# Patient Record
Sex: Male | Born: 1955 | Race: Black or African American | Hispanic: No | Marital: Single
Health system: Southern US, Academic
[De-identification: ages and names within clinical notes are randomized; demographics above are authoritative.]

## PROBLEM LIST (undated history)

## (undated) ENCOUNTER — Ambulatory Visit: Payer: Medicare (Managed Care)

## (undated) ENCOUNTER — Telehealth

## (undated) ENCOUNTER — Encounter

## (undated) ENCOUNTER — Ambulatory Visit

## (undated) ENCOUNTER — Encounter: Attending: Nephrology | Primary: Nephrology

## (undated) ENCOUNTER — Ambulatory Visit
Payer: Medicare (Managed Care) | Attending: Student in an Organized Health Care Education/Training Program | Primary: Student in an Organized Health Care Education/Training Program

## (undated) ENCOUNTER — Telehealth: Attending: Internal Medicine | Primary: Internal Medicine

## (undated) ENCOUNTER — Ambulatory Visit: Payer: MEDICARE

## (undated) ENCOUNTER — Encounter: Attending: Adult Health | Primary: Adult Health

## (undated) ENCOUNTER — Ambulatory Visit
Payer: Medicare (Managed Care) | Attending: Rehabilitative and Restorative Service Providers" | Primary: Rehabilitative and Restorative Service Providers"

## (undated) ENCOUNTER — Ambulatory Visit: Payer: MEDICARE | Attending: Nephrology | Primary: Nephrology

## (undated) ENCOUNTER — Ambulatory Visit
Attending: Rehabilitative and Restorative Service Providers" | Primary: Rehabilitative and Restorative Service Providers"

## (undated) ENCOUNTER — Encounter
Payer: MEDICARE | Attending: Student in an Organized Health Care Education/Training Program | Primary: Student in an Organized Health Care Education/Training Program

## (undated) ENCOUNTER — Encounter: Attending: Internal Medicine | Primary: Internal Medicine

## (undated) ENCOUNTER — Encounter: Payer: MEDICARE | Attending: Nephrology | Primary: Nephrology

## (undated) ENCOUNTER — Ambulatory Visit: Attending: Geriatric Medicine | Primary: Geriatric Medicine

## (undated) ENCOUNTER — Encounter: Attending: Clinical | Primary: Clinical

## (undated) ENCOUNTER — Encounter: Payer: MEDICARE | Attending: Registered" | Primary: Registered"

## (undated) ENCOUNTER — Encounter: Payer: MEDICARE | Attending: Internal Medicine | Primary: Internal Medicine

## (undated) ENCOUNTER — Inpatient Hospital Stay: Payer: Medicare (Managed Care)

## (undated) ENCOUNTER — Ambulatory Visit
Attending: Student in an Organized Health Care Education/Training Program | Primary: Student in an Organized Health Care Education/Training Program

## (undated) ENCOUNTER — Inpatient Hospital Stay: Payer: MEDICARE

## (undated) ENCOUNTER — Ambulatory Visit: Attending: Nephrology | Primary: Nephrology

## (undated) ENCOUNTER — Inpatient Hospital Stay

## (undated) DIAGNOSIS — E119 Type 2 diabetes mellitus without complications: Secondary | ICD-10-CM

## (undated) DIAGNOSIS — N289 Disorder of kidney and ureter, unspecified: Secondary | ICD-10-CM

## (undated) DIAGNOSIS — I10 Essential (primary) hypertension: Secondary | ICD-10-CM

## (undated) HISTORY — PX: CHOLECYSTECTOMY: SHX55

## (undated) HISTORY — PX: NEPHRECTOMY TRANSPLANTED ORGAN: SUR880

---
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
## 2004-04-26 ENCOUNTER — Emergency Department: Payer: Self-pay | Admitting: Internal Medicine

## 2006-12-01 ENCOUNTER — Emergency Department: Payer: Self-pay | Admitting: Emergency Medicine

## 2006-12-01 ENCOUNTER — Other Ambulatory Visit: Payer: Self-pay

## 2009-07-05 IMAGING — CR DG CHEST 1V PORT
1 series · 1 of 1 positions shown · non-contrast
Comparison: none

REASON FOR EXAM: hyperkalemia
COMMENTS:

[view not recorded]
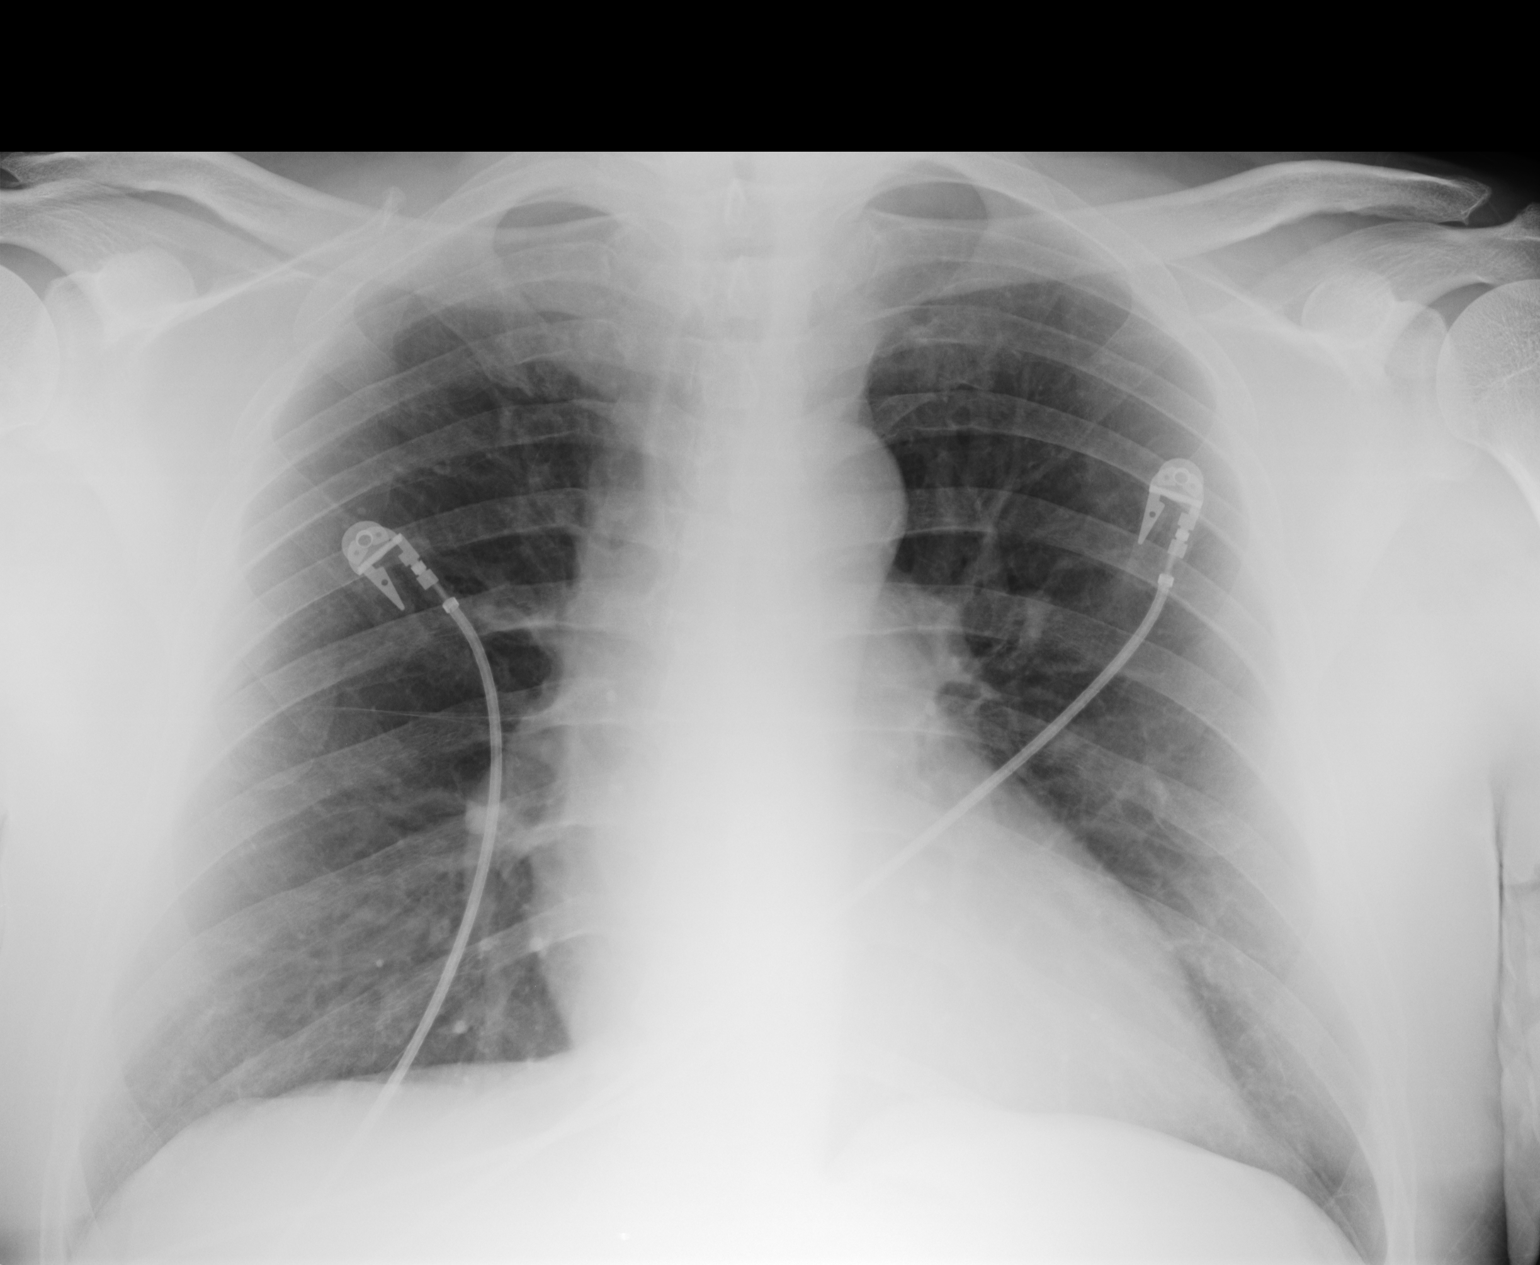

[1 of 1 positions shown; findings below may reference images not displayed]

PROCEDURE:     DXR - DXR PORTABLE CHEST SINGLE VIEW  - December 01, 2006  [DATE]

RESULT:     There are no prior studies for comparison. Cardiac monitoring
electrodes are present. The lungs are clear. The heart and pulmonary vessels
are normal. The bony and mediastinal structures are unremarkable. There is
no effusion. There is no pneumothorax or evidence of congestive failure.
IMPRESSION: No acute cardiopulmonary disease.

## 2010-11-25 ENCOUNTER — Other Ambulatory Visit: Payer: Self-pay

## 2010-12-02 ENCOUNTER — Other Ambulatory Visit: Payer: Self-pay

## 2011-01-15 ENCOUNTER — Other Ambulatory Visit: Payer: Self-pay

## 2011-02-01 ENCOUNTER — Other Ambulatory Visit: Payer: Self-pay

## 2011-04-23 ENCOUNTER — Other Ambulatory Visit: Payer: Self-pay

## 2011-05-04 ENCOUNTER — Other Ambulatory Visit: Payer: Self-pay

## 2011-07-14 ENCOUNTER — Other Ambulatory Visit: Payer: Self-pay

## 2011-07-14 LAB — BASIC METABOLIC PANEL
Anion Gap: 12 (ref 7–16)
BUN: 45 mg/dL — ABNORMAL HIGH (ref 7–18)
Calcium, Total: 9.5 mg/dL (ref 8.5–10.1)
Chloride: 107 mmol/L (ref 98–107)
Co2: 21 mmol/L (ref 21–32)
EGFR (African American): 44 — ABNORMAL LOW
EGFR (Non-African Amer.): 36 — ABNORMAL LOW
Glucose: 111 mg/dL — ABNORMAL HIGH (ref 65–99)
Osmolality: 292 (ref 275–301)
Sodium: 140 mmol/L (ref 136–145)

## 2011-07-14 LAB — CBC WITH DIFFERENTIAL/PLATELET
Basophil %: 0.7 %
Eosinophil #: 0.1 10*3/uL (ref 0.0–0.7)
Eosinophil %: 1.8 %
Lymphocyte #: 0.9 10*3/uL — ABNORMAL LOW (ref 1.0–3.6)
MCH: 26.6 pg (ref 26.0–34.0)
MCHC: 32.6 g/dL (ref 32.0–36.0)
Monocyte #: 0.5 10*3/uL (ref 0.0–0.7)
Neutrophil #: 2.2 10*3/uL (ref 1.4–6.5)
Neutrophil %: 60.5 %
Platelet: 156 10*3/uL (ref 150–440)
RBC: 5.26 10*6/uL (ref 4.40–5.90)

## 2011-08-02 ENCOUNTER — Other Ambulatory Visit: Payer: Self-pay

## 2011-08-16 LAB — CBC WITH DIFFERENTIAL/PLATELET
Basophil %: 0.1 %
HCT: 43.3 % (ref 40.0–52.0)
HGB: 13.7 g/dL (ref 13.0–18.0)
Lymphocyte #: 0.5 10*3/uL — ABNORMAL LOW (ref 1.0–3.6)
Lymphocyte %: 4.5 %
MCHC: 31.7 g/dL — ABNORMAL LOW (ref 32.0–36.0)
MCV: 82 fL (ref 80–100)
Platelet: 305 10*3/uL (ref 150–440)
RBC: 5.27 10*6/uL (ref 4.40–5.90)
RDW: 16.4 % — ABNORMAL HIGH (ref 11.5–14.5)

## 2011-08-16 LAB — BASIC METABOLIC PANEL
Anion Gap: 16 (ref 7–16)
BUN: 114 mg/dL — ABNORMAL HIGH (ref 7–18)
EGFR (Non-African Amer.): 16 — ABNORMAL LOW
Osmolality: 302 (ref 275–301)
Potassium: 4.5 mmol/L (ref 3.5–5.1)
Sodium: 130 mmol/L — ABNORMAL LOW (ref 136–145)

## 2011-08-16 LAB — PHOSPHORUS: Phosphorus: 4.4 mg/dL (ref 2.5–4.9)

## 2011-08-16 LAB — MAGNESIUM: Magnesium: 1.8 mg/dL

## 2011-09-01 ENCOUNTER — Other Ambulatory Visit: Payer: Self-pay

## 2011-11-25 ENCOUNTER — Other Ambulatory Visit: Payer: Self-pay

## 2011-11-25 LAB — CBC WITH DIFFERENTIAL/PLATELET
Basophil #: 0 10*3/uL (ref 0.0–0.1)
HCT: 41.1 % (ref 40.0–52.0)
Lymphocyte #: 1.1 10*3/uL (ref 1.0–3.6)
Lymphocyte %: 30.1 %
MCH: 25.5 pg — ABNORMAL LOW (ref 26.0–34.0)
MCHC: 32.2 g/dL (ref 32.0–36.0)
MCV: 79 fL — ABNORMAL LOW (ref 80–100)
Monocyte #: 0.4 x10 3/mm (ref 0.2–1.0)
Neutrophil #: 2 10*3/uL (ref 1.4–6.5)
Platelet: 211 10*3/uL (ref 150–440)
RBC: 5.18 10*6/uL (ref 4.40–5.90)
RDW: 15.5 % — ABNORMAL HIGH (ref 11.5–14.5)
WBC: 3.7 10*3/uL — ABNORMAL LOW (ref 3.8–10.6)

## 2011-11-25 LAB — MAGNESIUM: Magnesium: 2.1 mg/dL

## 2011-11-25 LAB — COMPREHENSIVE METABOLIC PANEL
Albumin: 3.8 g/dL (ref 3.4–5.0)
Alkaline Phosphatase: 71 U/L (ref 50–136)
BUN: 58 mg/dL — ABNORMAL HIGH (ref 7–18)
Calcium, Total: 9.5 mg/dL (ref 8.5–10.1)
Co2: 23 mmol/L (ref 21–32)
Creatinine: 2.47 mg/dL — ABNORMAL HIGH (ref 0.60–1.30)
EGFR (African American): 32 — ABNORMAL LOW
EGFR (Non-African Amer.): 28 — ABNORMAL LOW
Potassium: 4.7 mmol/L (ref 3.5–5.1)
SGOT(AST): 16 U/L (ref 15–37)
SGPT (ALT): 13 U/L

## 2011-11-25 LAB — PHOSPHORUS: Phosphorus: 3.6 mg/dL (ref 2.5–4.9)

## 2011-11-25 LAB — BILIRUBIN, DIRECT: Bilirubin, Direct: <0.1 mg/dL (ref 0.00–0.20)

## 2011-11-25 LAB — LIPID PANEL
Cholesterol: 146 mg/dL (ref 0–200)
Triglycerides: 127 mg/dL (ref 0–200)

## 2011-11-25 LAB — GAMMA GT: GGT: 14 U/L (ref 5–85)

## 2011-12-02 ENCOUNTER — Other Ambulatory Visit: Payer: Self-pay

## 2012-01-25 ENCOUNTER — Other Ambulatory Visit: Payer: Self-pay

## 2012-01-25 LAB — CBC WITH DIFFERENTIAL/PLATELET
Basophil #: 0 10*3/uL (ref 0.0–0.1)
Basophil %: 1.1 %
Eosinophil #: 0.1 10*3/uL (ref 0.0–0.7)
HGB: 14.2 g/dL (ref 13.0–18.0)
Lymphocyte #: 1 10*3/uL (ref 1.0–3.6)
Lymphocyte %: 28.4 %
MCH: 25.6 pg — ABNORMAL LOW (ref 26.0–34.0)
MCHC: 33.1 g/dL (ref 32.0–36.0)
MCV: 77 fL — ABNORMAL LOW (ref 80–100)
Neutrophil %: 57.7 %

## 2012-01-25 LAB — BASIC METABOLIC PANEL
Anion Gap: 9 (ref 7–16)
BUN: 44 mg/dL — ABNORMAL HIGH (ref 7–18)
Calcium, Total: 9.3 mg/dL (ref 8.5–10.1)
EGFR (African American): 41 — ABNORMAL LOW
EGFR (Non-African Amer.): 35 — ABNORMAL LOW
Glucose: 119 mg/dL — ABNORMAL HIGH (ref 65–99)
Osmolality: 282 (ref 275–301)
Sodium: 135 mmol/L — ABNORMAL LOW (ref 136–145)

## 2012-01-25 LAB — MAGNESIUM: Magnesium: 1.5 mg/dL — ABNORMAL LOW

## 2012-02-01 ENCOUNTER — Other Ambulatory Visit: Payer: Self-pay

## 2012-03-15 ENCOUNTER — Other Ambulatory Visit: Payer: Self-pay

## 2012-03-15 LAB — CBC WITH DIFFERENTIAL/PLATELET
Basophil #: 0 10*3/uL (ref 0.0–0.1)
Basophil %: 0.8 %
Lymphocyte #: 0.9 10*3/uL — ABNORMAL LOW (ref 1.0–3.6)
Lymphocyte %: 27.5 %
MCV: 79 fL — ABNORMAL LOW (ref 80–100)
Monocyte %: 10.4 %
Neutrophil #: 2 10*3/uL (ref 1.4–6.5)
Platelet: 178 10*3/uL (ref 150–440)
RDW: 17.7 % — ABNORMAL HIGH (ref 11.5–14.5)
WBC: 3.4 10*3/uL — ABNORMAL LOW (ref 3.8–10.6)

## 2012-03-15 LAB — COMPREHENSIVE METABOLIC PANEL
Bilirubin,Total: 0.4 mg/dL (ref 0.2–1.0)
Calcium, Total: 9.1 mg/dL (ref 8.5–10.1)
Chloride: 105 mmol/L (ref 98–107)
Co2: 22 mmol/L (ref 21–32)
Creatinine: 1.67 mg/dL — ABNORMAL HIGH (ref 0.60–1.30)
EGFR (African American): 51 — ABNORMAL LOW
EGFR (Non-African Amer.): 44 — ABNORMAL LOW
SGOT(AST): 13 U/L — ABNORMAL LOW (ref 15–37)
SGPT (ALT): 22 U/L (ref 12–78)
Total Protein: 8.3 g/dL — ABNORMAL HIGH (ref 6.4–8.2)

## 2012-03-15 LAB — LIPID PANEL
Cholesterol: 160 mg/dL (ref 0–200)
HDL Cholesterol: 56 mg/dL (ref 40–60)
Ldl Cholesterol, Calc: 89 mg/dL (ref 0–100)
Triglycerides: 75 mg/dL (ref 0–200)
VLDL Cholesterol, Calc: 15 mg/dL (ref 5–40)

## 2012-03-15 LAB — MAGNESIUM: Magnesium: 1.5 mg/dL — ABNORMAL LOW

## 2012-03-15 LAB — BILIRUBIN, DIRECT: Bilirubin, Direct: 0.1 mg/dL (ref 0.00–0.20)

## 2012-03-15 LAB — PHOSPHORUS: Phosphorus: 3.8 mg/dL (ref 2.5–4.9)

## 2012-04-02 ENCOUNTER — Other Ambulatory Visit: Payer: Self-pay

## 2012-06-09 ENCOUNTER — Other Ambulatory Visit: Payer: Self-pay | Admitting: Nephrology

## 2012-06-09 LAB — COMPREHENSIVE METABOLIC PANEL
Albumin: 3.7 g/dL (ref 3.4–5.0)
Alkaline Phosphatase: 85 U/L (ref 50–136)
BUN: 51 mg/dL — ABNORMAL HIGH (ref 7–18)
Bilirubin,Total: 0.3 mg/dL (ref 0.2–1.0)
Calcium, Total: 9.2 mg/dL (ref 8.5–10.1)
Co2: 20 mmol/L — ABNORMAL LOW (ref 21–32)
Creatinine: 2.05 mg/dL — ABNORMAL HIGH (ref 0.60–1.30)
EGFR (Non-African Amer.): 34 — ABNORMAL LOW
Glucose: 115 mg/dL — ABNORMAL HIGH (ref 65–99)
SGOT(AST): 18 U/L (ref 15–37)
SGPT (ALT): 28 U/L (ref 12–78)
Total Protein: 8.3 g/dL — ABNORMAL HIGH (ref 6.4–8.2)

## 2012-06-09 LAB — CBC WITH DIFFERENTIAL/PLATELET
Basophil #: 0 10*3/uL (ref 0.0–0.1)
Basophil %: 0.8 %
Eosinophil #: 0.1 10*3/uL (ref 0.0–0.7)
Lymphocyte #: 0.9 10*3/uL — ABNORMAL LOW (ref 1.0–3.6)
MCH: 25.9 pg — ABNORMAL LOW (ref 26.0–34.0)
MCV: 80 fL (ref 80–100)
Monocyte %: 12.1 %
Neutrophil #: 1.8 10*3/uL (ref 1.4–6.5)
Neutrophil %: 57 %
Platelet: 146 10*3/uL — ABNORMAL LOW (ref 150–440)
RBC: 5.42 10*6/uL (ref 4.40–5.90)
RDW: 15.8 % — ABNORMAL HIGH (ref 11.5–14.5)

## 2012-06-09 LAB — LIPID PANEL
Ldl Cholesterol, Calc: 70 mg/dL (ref 0–100)
Triglycerides: 115 mg/dL (ref 0–200)
VLDL Cholesterol, Calc: 23 mg/dL (ref 5–40)

## 2012-06-09 LAB — PHOSPHORUS: Phosphorus: 4.5 mg/dL (ref 2.5–4.9)

## 2012-06-09 LAB — BILIRUBIN, DIRECT: Bilirubin, Direct: <0.1 mg/dL (ref 0.00–0.20)

## 2012-06-09 LAB — MAGNESIUM: Magnesium: 1.7 mg/dL — ABNORMAL LOW

## 2012-07-01 ENCOUNTER — Other Ambulatory Visit: Payer: Self-pay | Admitting: Nephrology

## 2012-10-12 ENCOUNTER — Other Ambulatory Visit: Payer: Self-pay | Admitting: Nephrology

## 2012-10-12 LAB — MAGNESIUM: Magnesium: 1.6 mg/dL — ABNORMAL LOW

## 2012-10-12 LAB — BASIC METABOLIC PANEL
BUN: 43 mg/dL — ABNORMAL HIGH (ref 7–18)
Calcium, Total: 9.5 mg/dL (ref 8.5–10.1)
Chloride: 105 mmol/L (ref 98–107)
Creatinine: 2.05 mg/dL — ABNORMAL HIGH (ref 0.60–1.30)
EGFR (Non-African Amer.): 34 — ABNORMAL LOW
Glucose: 128 mg/dL — ABNORMAL HIGH (ref 65–99)
Osmolality: 279 (ref 275–301)

## 2012-10-12 LAB — CBC WITH DIFFERENTIAL/PLATELET
Basophil #: 0 10*3/uL (ref 0.0–0.1)
Eosinophil #: 0.1 10*3/uL (ref 0.0–0.7)
HGB: 13.7 g/dL (ref 13.0–18.0)
Lymphocyte %: 25.8 %
Monocyte %: 11.6 %
Platelet: 169 10*3/uL (ref 150–440)

## 2012-10-12 LAB — PHOSPHORUS: Phosphorus: 3.4 mg/dL (ref 2.5–4.9)

## 2012-10-31 ENCOUNTER — Other Ambulatory Visit: Payer: Self-pay | Admitting: Nephrology

## 2013-06-05 ENCOUNTER — Other Ambulatory Visit: Payer: Self-pay | Admitting: Nephrology

## 2013-06-05 LAB — CBC WITH DIFFERENTIAL/PLATELET
BASOS ABS: 0 10*3/uL (ref 0.0–0.1)
BASOS PCT: 0.9 %
EOS PCT: 2.2 %
Eosinophil #: 0.1 10*3/uL (ref 0.0–0.7)
HCT: 39.3 % — ABNORMAL LOW (ref 40.0–52.0)
HGB: 12.9 g/dL — ABNORMAL LOW (ref 13.0–18.0)
LYMPHS ABS: 0.7 10*3/uL — AB (ref 1.0–3.6)
LYMPHS PCT: 20.6 %
MCH: 26.6 pg (ref 26.0–34.0)
MCHC: 32.7 g/dL (ref 32.0–36.0)
MCV: 81 fL (ref 80–100)
MONO ABS: 0.4 x10 3/mm (ref 0.2–1.0)
Monocyte %: 11.2 %
Neutrophil #: 2.3 10*3/uL (ref 1.4–6.5)
Neutrophil %: 65.1 %
Platelet: 147 10*3/uL — ABNORMAL LOW (ref 150–440)
RBC: 4.83 10*6/uL (ref 4.40–5.90)
RDW: 16.4 % — AB (ref 11.5–14.5)
WBC: 3.6 10*3/uL — ABNORMAL LOW (ref 3.8–10.6)

## 2013-06-05 LAB — COMPREHENSIVE METABOLIC PANEL
ALK PHOS: 63 U/L
AST: 21 U/L (ref 15–37)
Albumin: 3.6 g/dL (ref 3.4–5.0)
Anion Gap: 1 — ABNORMAL LOW (ref 7–16)
BILIRUBIN TOTAL: 0.3 mg/dL (ref 0.2–1.0)
BUN: 34 mg/dL — ABNORMAL HIGH (ref 7–18)
CALCIUM: 9 mg/dL (ref 8.5–10.1)
CHLORIDE: 111 mmol/L — AB (ref 98–107)
Co2: 22 mmol/L (ref 21–32)
Creatinine: 1.76 mg/dL — ABNORMAL HIGH (ref 0.60–1.30)
EGFR (African American): 48 — ABNORMAL LOW
EGFR (Non-African Amer.): 41 — ABNORMAL LOW
GLUCOSE: 104 mg/dL — AB (ref 65–99)
Osmolality: 276 (ref 275–301)
Potassium: 5.4 mmol/L — ABNORMAL HIGH (ref 3.5–5.1)
SGPT (ALT): 19 U/L (ref 12–78)
SODIUM: 134 mmol/L — AB (ref 136–145)
TOTAL PROTEIN: 7.2 g/dL (ref 6.4–8.2)

## 2013-06-05 LAB — LIPID PANEL
Cholesterol: 167 mg/dL (ref 0–200)
HDL Cholesterol: 51 mg/dL (ref 40–60)
Ldl Cholesterol, Calc: 95 mg/dL (ref 0–100)
TRIGLYCERIDES: 107 mg/dL (ref 0–200)
VLDL CHOLESTEROL, CALC: 21 mg/dL (ref 5–40)

## 2013-06-05 LAB — MAGNESIUM: MAGNESIUM: 1.3 mg/dL — AB

## 2013-06-07 LAB — PHOSPHORUS: Phosphorus: 3.6 mg/dL (ref 2.5–4.9)

## 2013-06-07 LAB — GAMMA GT: GGT: 24 U/L (ref 5–85)

## 2013-06-13 LAB — CBC WITH DIFFERENTIAL/PLATELET
BASOS PCT: 0.7 %
Basophil #: 0 10*3/uL (ref 0.0–0.1)
EOS PCT: 2 %
Eosinophil #: 0.1 10*3/uL (ref 0.0–0.7)
HCT: 41.1 % (ref 40.0–52.0)
HGB: 13.5 g/dL (ref 13.0–18.0)
LYMPHS PCT: 20.6 %
Lymphocyte #: 0.8 10*3/uL — ABNORMAL LOW (ref 1.0–3.6)
MCH: 26.7 pg (ref 26.0–34.0)
MCHC: 32.9 g/dL (ref 32.0–36.0)
MCV: 81 fL (ref 80–100)
Monocyte #: 0.4 x10 3/mm (ref 0.2–1.0)
Monocyte %: 11.5 %
NEUTROS ABS: 2.4 10*3/uL (ref 1.4–6.5)
NEUTROS PCT: 65.2 %
PLATELETS: 161 10*3/uL (ref 150–440)
RBC: 5.07 10*6/uL (ref 4.40–5.90)
RDW: 16.5 % — AB (ref 11.5–14.5)
WBC: 3.7 10*3/uL — AB (ref 3.8–10.6)

## 2013-06-13 LAB — BASIC METABOLIC PANEL
Anion Gap: 6 — ABNORMAL LOW (ref 7–16)
BUN: 35 mg/dL — AB (ref 7–18)
CALCIUM: 9.2 mg/dL (ref 8.5–10.1)
Chloride: 104 mmol/L (ref 98–107)
Co2: 23 mmol/L (ref 21–32)
Creatinine: 1.75 mg/dL — ABNORMAL HIGH (ref 0.60–1.30)
EGFR (African American): 48 — ABNORMAL LOW
GFR CALC NON AF AMER: 41 — AB
Glucose: 78 mg/dL (ref 65–99)
Osmolality: 273 (ref 275–301)
POTASSIUM: 5.4 mmol/L — AB (ref 3.5–5.1)
SODIUM: 133 mmol/L — AB (ref 136–145)

## 2013-06-13 LAB — MAGNESIUM: Magnesium: 1.4 mg/dL — ABNORMAL LOW

## 2013-06-13 LAB — PHOSPHORUS: PHOSPHORUS: 3.9 mg/dL (ref 2.5–4.9)

## 2013-07-01 ENCOUNTER — Other Ambulatory Visit: Payer: Self-pay | Admitting: Nephrology

## 2016-11-01 MED ORDER — VENTOLIN HFA 90 MCG/ACTUATION AEROSOL INHALER
3 refills | 0 days | Status: SS
Start: 2016-11-01 — End: 2017-08-01

## 2016-11-16 MED FILL — GENGRAF/100MG/CAPS: GENGRAF/100MG/CAPS | 30 days supply | Qty: 60 | Fill #6

## 2016-11-16 MED FILL — MYCOPHENOLATE/250 MG/CAPS: MYCOPHENOLATE/250 MG/CAPS | 30 days supply | Qty: 120 | Fill #6

## 2016-11-16 MED FILL — GENGRAF/25MG/CAPS: GENGRAF/25MG/CAPS | 30 days supply | Qty: 180 | Fill #6

## 2017-01-03 NOTE — Unmapped (Signed)
Patient request for RX refill.

## 2017-01-04 MED ORDER — HYDROCHLOROTHIAZIDE 50 MG TABLET
ORAL_TABLET | Freq: Every day | ORAL | 3 refills | 0 days | Status: CP
Start: 2017-01-04 — End: 2017-12-08

## 2017-01-04 MED ORDER — TRAMADOL 50 MG TABLET
ORAL_TABLET | 0 refills | 0 days | Status: CP
Start: 2017-01-04 — End: 2017-03-08

## 2017-01-16 ENCOUNTER — Emergency Department: Payer: Medicare Other

## 2017-01-16 ENCOUNTER — Emergency Department
Admission: EM | Admit: 2017-01-16 | Discharge: 2017-01-16 | Disposition: A | Discharge status: Home / Self Care | Payer: Medicare Other | Attending: Emergency Medicine | Admitting: Emergency Medicine

## 2017-01-16 ENCOUNTER — Encounter: Payer: Self-pay | Admitting: Emergency Medicine

## 2017-01-16 DIAGNOSIS — E119 Type 2 diabetes mellitus without complications: Secondary | ICD-10-CM | POA: Diagnosis not present

## 2017-01-16 DIAGNOSIS — W010XXA Fall on same level from slipping, tripping and stumbling without subsequent striking against object, initial encounter: Secondary | ICD-10-CM | POA: Insufficient documentation

## 2017-01-16 DIAGNOSIS — I10 Essential (primary) hypertension: Secondary | ICD-10-CM | POA: Insufficient documentation

## 2017-01-16 DIAGNOSIS — S299XXA Unspecified injury of thorax, initial encounter: Secondary | ICD-10-CM | POA: Diagnosis present

## 2017-01-16 DIAGNOSIS — Y929 Unspecified place or not applicable: Secondary | ICD-10-CM | POA: Diagnosis not present

## 2017-01-16 DIAGNOSIS — Y999 Unspecified external cause status: Secondary | ICD-10-CM | POA: Insufficient documentation

## 2017-01-16 DIAGNOSIS — S2242XA Multiple fractures of ribs, left side, initial encounter for closed fracture: Secondary | ICD-10-CM | POA: Diagnosis not present

## 2017-01-16 DIAGNOSIS — Z79899 Other long term (current) drug therapy: Secondary | ICD-10-CM | POA: Diagnosis not present

## 2017-01-16 DIAGNOSIS — Y9301 Activity, walking, marching and hiking: Secondary | ICD-10-CM | POA: Diagnosis not present

## 2017-01-16 DIAGNOSIS — W19XXXA Unspecified fall, initial encounter: Secondary | ICD-10-CM

## 2017-01-16 HISTORY — DX: Disorder of kidney and ureter, unspecified: N28.9

## 2017-01-16 HISTORY — DX: Type 2 diabetes mellitus without complications: E11.9

## 2017-01-16 HISTORY — DX: Essential (primary) hypertension: I10

## 2017-01-16 MED ORDER — OXYCODONE-ACETAMINOPHEN 5-325 MG PO TABS
1.0000 | ORAL_TABLET | Freq: Once | ORAL | Status: AC
  Administered 2017-01-16: 1 via ORAL
  Filled 2017-01-16: qty 1

## 2017-01-16 MED ORDER — OXYCODONE-ACETAMINOPHEN 5-325 MG PO TABS: 1.0000 | ORAL_TABLET | Freq: Four times a day (QID) | ORAL | 0 refills | Status: AC | PRN

## 2017-01-16 MED ORDER — CYCLOBENZAPRINE HCL 5 MG PO TABS: 5.0000 mg | ORAL_TABLET | Freq: Three times a day (TID) | ORAL | 0 refills | Status: AC | PRN

## 2017-01-16 NOTE — ED Notes (Signed)
See triage note  States he tripped and fell on Friday  Having pain to left rib area

## 2017-01-16 NOTE — ED Triage Notes (Signed)
Patient tripped and fell Friday striking his left ribs. C/O left rib pain. No difficulty breathing. A+Ox4

## 2017-01-16 NOTE — ED Provider Notes (Signed)
[facility] Emergency Department Provider Note   ____________________________________________   I have reviewed the triage vital signs and the nursing notes.   HISTORY  Chief Complaint Rib Injury    HPI Blake Torres is a 61 y.o. male presents to the emergency department with left side rib pain along the axilla region. Patient reports walking outside when he tripped over a stump landing on hard ground against the left rib cage. Patient reports pain however he denies any shortness of breath or pulmonary symptoms following the injury. Patient noted symptoms improved but then yesterday he felt a "pop" in the area followed by return of symptoms and "as not been comfortable since". Patient reports increased symptoms with torso movement, when he attempts to reposition lying down on the bed and breathing in and out. Patient denies back or neck pain or sustaining any other injury with fall. Patient denies fever, chills, headache, vision changes, chest pain, chest tightness, shortness of breath, abdominal pain, nausea and vomiting.  Past Medical History:  Diagnosis Date  . Diabetes mellitus without complication (HCC)   . Hypertension   . Renal disorder     There are no active problems to display for this patient.   Past Surgical History:  Procedure Laterality Date  . CHOLECYSTECTOMY    . NEPHRECTOMY TRANSPLANTED ORGAN      Prior to Admission medications   Medication Sig Start Date End Date Taking? Authorizing Provider  hydrALAZINE (APRESOLINE) 50 MG tablet Take 50 mg by mouth 3 (three) times daily.   Yes [provider]  metoprolol tartrate (LOPRESSOR) 50 MG tablet Take 50 mg by mouth 2 (two) times daily.   Yes [provider]  cyclobenzaprine (FLEXERIL) 5 MG tablet Take 1 tablet (5 mg total) by mouth 3 (three) times daily as needed. 01/16/17   Daviona Herbert M, PA-C  oxyCODONE-acetaminophen (ROXICET) 5-325 MG tablet Take 1 tablet by mouth  every 6 (six) hours as needed. 01/16/17 01/16/18  Jesusita Jocelyn, Jordan Likes, PA-C    Allergies Patient has no known allergies.  No family history on file.  Social History Social History  Substance Use Topics  . Smoking status: Never Smoker  . Smokeless tobacco: Never Used  . Alcohol use No    Review of Systems Constitutional: Negative for fever/chills Eyes: No visual changes. ENT:  Negative for sore throat and for difficulty swallowing Cardiovascular: Denies chest pain. Respiratory: Denies cough. Denies shortness of breath. Gastrointestinal: No abdominal pain.  No nausea, vomiting, diarrhea. Musculoskeletal: Positive for left side rib pain. Skin: Negative for rash. Neurological: Negative for headaches. Able to ambulate. ____________________________________________   PHYSICAL EXAM:  VITAL SIGNS: ED Triage Vitals  Enc Vitals Group     BP 01/16/17 0822 (!) 164/68     Pulse Rate 01/16/17 0822 62     Resp 01/16/17 0822 13     Temp 01/16/17 0822 98 F (36.7 C)     Temp Source 01/16/17 0822 Oral     SpO2 01/16/17 0822 100 %     Weight 01/16/17 0822 230 lb (104.3 kg)     Height 01/16/17 0822  (1.88 m)     Head Circumference --      Peak Flow --      Pain Score 01/16/17 0816 7     Pain Loc --      Pain Edu? --      Excl. in GC? --     Constitutional: Alert and oriented. Well appearing and in no  acute distress.  Eyes: Conjunctivae are normal. PERRL. EOMI  Head: Normocephalic and atraumatic. ENT: Ears:Canals clear. TMs intact bilaterally. Nose: No congestion/rhinnorhea. Mouth/Throat: Mucous membranes are moist.  Neck:Supple. No thyromegaly. No stridor. Cardiovascular: Normal rate, regular rhythm. Normal S1 and S2. Good peripheral circulation. Respiratory: Normal respiratory effort without tachypnea or retractions. Lungs CTAB. No wheezes/rales/rhonchi. Good air entry to the bases with no decreased or absent breath  sounds. Hematological/Lymphatic/Immunological: No cervical lymphadenopathy. Cardiovascular: Normal rate, regular rhythm. Normal distal pulses. Gastrointestinal: Bowel sounds 4 quadrant. No CVA tenderness. Musculoskeletal: Left axillary rib pain along ~ribs 5-8. No crepitus or deformities noted.   Neurologic: Normal speech and language.  Skin:  Skin is warm, dry and intact. No rash noted. Psychiatric:Mood and affect are normal. Speech and behavior are normal. Patient exhibits appropriate insight and judgement. ____________________________________________   LABS (all labs ordered are listed, but only abnormal results are displayed)  Labs Reviewed - No data to display ____________________________________________  EKG none ____________________________________________  RADIOLOGY DG ribs unilateral w/ chest left FINDINGS: Fractures of the left posterior 6 and 7 ribs without significant displacement. No pneumothorax. The cardiomediastinal silhouette is normal. No pulmonary nodules, masses, or infiltrates.  IMPRESSION: Fractures of the left posterior sixth and seventh ribs without pneumothorax. ____________________________________________   PROCEDURES  Procedure(s) performed: no    Critical Care performed: no ____________________________________________   INITIAL IMPRESSION / ASSESSMENT AND PLAN / ED COURSE  Pertinent labs & imaging results that were available during my care of the patient were reviewed by me and considered in my medical decision making (see chart for details).  Patient presents to emergency department with left side rib pain since falling 3 days ago. History, physical exam findings and imaging are reassuring symptoms are consistent with fracture of left side rib 6 and 7 without displacement or pneumothorax. Patient will be prescribed oxycodone and Flexeril for symptom management. Patient advised to modify activity until fractures heal.  Patient advised to  follow up with PCP as needed or return to the emergency department if symptoms return or worsen. Patient informed of clinical course, understand medical decision-making process, and agree with plan.      If controlled substance prescribed during this visit, 12 month history viewed on the [facility] prior to issuing an initial prescription for Schedule II or III opiod. ____________________________________________   FINAL CLINICAL IMPRESSION(S) / ED DIAGNOSES  Final diagnoses:  Fall, initial encounter  Closed fracture of multiple ribs of left side, initial encounter       NEW MEDICATIONS STARTED DURING THIS VISIT:  New Prescriptions   CYCLOBENZAPRINE (FLEXERIL) 5 MG TABLET    Take 1 tablet (5 mg total) by mouth 3 (three) times daily as needed.   OXYCODONE-ACETAMINOPHEN (ROXICET) 5-325 MG TABLET    Take 1 tablet by mouth every 6 (six) hours as needed.     Note:  This document was prepared using Dragon voice recognition software and may include unintentional dictation errors.    Clois Comber, PA-C 01/16/17 1008    Arnaldo Natal, MD 01/16/17 [phone]

## 2017-01-16 NOTE — Discharge Instructions (Signed)
Take medication as prescribed. Return to emergency department if symptoms worsen and follow-up with PCP as needed.    Return to the Emergency Department immediately if:  You have a fever. You have trouble breathing. You cannot stop coughing. You cough up thick or bloody spit (mucus). You feel sick to your stomach (nauseous), throw up (vomit), or have belly (abdominal) pain. Your pain gets worse and medicine does not help.

## 2017-01-20 MED ORDER — FUROSEMIDE 20 MG TABLET
ORAL_TABLET | 4 refills | 0 days | Status: CP
Start: 2017-01-20 — End: 2017-08-02

## 2017-01-20 NOTE — Unmapped (Signed)
Patient request for RX refill.

## 2017-01-27 NOTE — Unmapped (Signed)
Patient request for RX refill.

## 2017-01-27 NOTE — Unmapped (Signed)
[facility] Special[facility] Pharmacy Refill Coordination Note  Specialty Medication(s): Mycophenolate 250mg , Gengraf 25mg  and 100mg     [name], DOB: November 09, 1955  Phone: [phone] (home) , Alternate phone contact: N/A  Phone or address changes today?: No  All above HIPAA information was verified with patient.  Shipping Address: [address]  [place] [address]   Insurance changes? No    Completed refill call assessment today to schedule patient's medication shipment from the [facility] [phone]).      Confirmed the medication and dosage are correct and have not changed: Yes, regimen is correct and unchanged.    Confirmed patient started or stopped the following medications in the past month:  No, there are no changes reported at this time.    Are you tolerating your medication?:  Paschal reports tolerating the medication.    ADHERENCE    (Below is required for Medicare Part B or Transplant patients only - per drug):   How many tablets were dispensed last month: Mycophenolate #120, Gengraf 25mg  #280, Gengraf 100mg  #60  Patient currently has 5 days supply remaining.    Did you miss any doses in the past 4 weeks? No missed doses reported.    FINANCIAL/SHIPPING    Delivery Scheduled: Yes, Expected medication delivery date: 01/31/17     Bayne did not have any additional questions at this time.    Delivery address validated in FSI scheduling system: Yes, address listed in FSI is correct.    We will follow up with patient monthly for standard refill processing and delivery.      Thank you,  [employer]   [facility] Specialty Pharmacist

## 2017-01-28 MED ORDER — HYDRALAZINE 25 MG TABLET
ORAL_TABLET | Freq: Three times a day (TID) | ORAL | 6 refills | 0 days | Status: CP
Start: 2017-01-28 — End: 2017-09-04

## 2017-01-28 MED FILL — GENGRAF/25MG/CAPS: GENGRAF/25MG/CAPS | 30 days supply | Qty: 180 | Fill #7

## 2017-01-28 MED FILL — MYCOPHENOLATE MOFETIL/250MG/CAPS: MYCOPHENOLATE MOFETIL/250MG/CAPS | 30 days supply | Qty: 120 | Fill #7

## 2017-01-28 MED FILL — GENGRAF/100MG/CAPS: GENGRAF/100MG/CAPS | 30 days supply | Qty: 60 | Fill #7

## 2017-02-08 NOTE — Unmapped (Signed)
Pt. Received [facility] 2018-2019, [employer] Lot #[id number] on 02/07/2017 in left deltoid.

## 2017-02-21 NOTE — Unmapped (Signed)
[facility] Specialty Pharmacy Refill and Clinical Coordination Note  Medication(s): GENGRAF 25, GENGRAF 100, MYCOPHENOLATE 250    [name], [place]: May 18, 1955  Phone: [phone] (home) , Alternate phone contact: N/A  Shipping address: [address]  [place] [address]  Phone or address changes today?: No  All above HIPAA information verified.  Insurance changes? No    Completed refill and clinical call assessment today to schedule patient's medication shipment from the [facility] [phone]).      MEDICATION RECONCILIATION    Confirmed the medication and dosage are correct and have not changed: Yes, regimen is correct and unchanged.    Were there any changes to your medication(s) in the past month:  No, there are no changes reported at this time.    ADHERENCE    Is this medicine transplant or covered by Medicare Part B? Yes.    Gengraf 25 mg   Quantity filled last month: 180   # of tablets left on hand: 60  Gengraf 100 mg   Quantity filled last month: 60   # of tablets left on hand: 20  Mycophenolate Mofetil 250 mg   Quantity filled last month: 120   # of tablets left on hand: 40    Did you miss any doses in the past 4 weeks? No missed doses reported.  Adherence counseling provided? Not needed     SIDE EFFECT MANAGEMENT    Are you tolerating your medication?:  Login reports tolerating the medication.  Side effect management discussed: None      Therapy is appropriate and should be continued.    Evidence of clinical benefit: See Epic note from 10/27/16      FINANCIAL/SHIPPING    Delivery Scheduled: Yes, Expected medication delivery date: 02/24/17   Additional medications refilled: No additional medications/refills needed at this time.    Sina did not have any additional questions at this time.    Delivery address validated in FSI scheduling system: Yes, address listed above is correct.      We will follow up with patient monthly for standard refill processing and delivery.      Thank you,  Mickle Mallory   [facility] Specialty Pharmacist

## 2017-02-23 MED FILL — GENGRAF/25MG/CAPS: GENGRAF/25MG/CAPS | 30 days supply | Qty: 180 | Fill #8

## 2017-02-23 MED FILL — GENGRAF/100MG/CAPS: GENGRAF/100MG/CAPS | 30 days supply | Qty: 60 | Fill #8

## 2017-02-23 MED FILL — MYCOPHENOLATE MOFETIL/250MG/CAPS: MYCOPHENOLATE MOFETIL/250MG/CAPS | 30 days supply | Qty: 120 | Fill #8

## 2017-03-10 MED ORDER — TRAMADOL 50 MG TABLET
ORAL_TABLET | Freq: Four times a day (QID) | ORAL | 5 refills | 0 days | Status: CP | PRN
Start: 2017-03-10 — End: 2017-04-20

## 2017-03-17 NOTE — Unmapped (Signed)
[facility] Pharm[facility] Refill Coordination Note  Specialty Medication(s): Mycophenolate 250mg  & Gengraf 100mg  & 25mg      [name], DOB: 06/21/1955  Phone: [phone] (home) , Alternate phone contact: N/A  Phone or address changes today?: No  All above HIPAA information was verified with patient.  Shipping Address: [address]  [place] [address]   Insurance changes? No    Completed refill call assessment today to schedule patient's medication shipment from the [facility] [phone]).      Confirmed the medication and dosage are correct and have not changed: Yes, regimen is correct and unchanged.    Confirmed patient started or stopped the following medications in the past month:  No, there are no changes reported at this time.    Are you tolerating your medication?:  Andrew Diaz reports tolerating the medication.    ADHERENCE    (Below is required for Medicare Part B or Transplant patients only - per drug):   How many tablets were dispensed last month:   Cellcept 250 mg   Quantity filled last month: 120   # of tablets left on hand: 9 days    Gengraf 25 mg   Quantity filled last month: 180   # of tablets left on hand: 9 days  Gengraf 100 mg   Quantity filled last month: 60   # of tablets left on hand: 9 days    Did you miss any doses in the past 4 weeks? No missed doses reported.    FINANCIAL/SHIPPING    Delivery Scheduled: Yes, Expected medication delivery date: 03/23/2017     Andrew Diaz did not have any additional questions at this time.    Delivery address validated in FSI scheduling system: Yes, address listed in FSI is correct.    We will follow up with patient monthly for standard refill processing and delivery.      Thank you,  Tamala Fothergill   [facility] Specialty Technician

## 2017-03-22 MED FILL — GENGRAF/100MG/CAPS: GENGRAF/100MG/CAPS | 30 days supply | Qty: 60 | Fill #9

## 2017-03-22 MED FILL — MYCOPHENOLATE MOFETIL/250MG/CAPS: MYCOPHENOLATE MOFETIL/250MG/CAPS | 30 days supply | Qty: 120 | Fill #9

## 2017-03-22 MED FILL — GENGRAF/25MG/CAPS: GENGRAF/25MG/CAPS | 30 days supply | Qty: 180 | Fill #9

## 2017-04-04 MED ORDER — SODIUM POLYSTYRENE SULFONATE 15 GRAM/60 ML ORAL SUSPENSION
Freq: Every day | ORAL | 2 refills | 0 days | Status: CP | PRN
Start: 2017-04-04 — End: 2017-04-20

## 2017-04-04 MED ORDER — SODIUM POLYSTYRENE SULFONATE 15 GRAM-SORBITOL 20 GRAM/60 ML ORAL SUSP
2 refills | 0 days
Start: 2017-04-04 — End: 2018-04-05

## 2017-04-13 NOTE — Unmapped (Signed)
[facility] Specialty Pharmacy Refill Coordination Note  Specialty Medication(s): GENGRAF/MYCOPHENOLATE  Additional Medications shipped:     [name], [place]: 1955-07-06  Phone: [phone] (home) , Alternate phone contact: N/A  Phone or address changes today?: No  All above HIPAA information was verified with patient.  Shipping Address: [address]  [place] [address]   Insurance changes? No    Completed refill call assessment today to schedule patient's medication shipment from the [facility] [phone]).      Confirmed the medication and dosage are correct and have not changed: Yes, regimen is correct and unchanged.    Confirmed patient started or stopped the following medications in the past month:  No, there are no changes reported at this time.    Are you tolerating your medication?:  YES    ADHERENCE    (Below is required for Medicare Part B or Transplant patients only - per drug):   How many tablets were dispensed last month: Gengraf (100) 60  Gengraf (25) 180  Mycophenolate (250) 120  Patient currently has 100mg  (14) 25mg (42) 250mg  (28)  remaining.    Did you miss any doses in the past 4 weeks? No missed doses reported.    FINANCIAL/SHIPPING    Delivery Scheduled: Yes, Expected medication delivery date: 12/18     Andrew Diaz did not have any additional questions at this time.    Delivery address validated in FSI scheduling system: Yes, address listed in FSI is correct.    We will follow up with patient monthly for standard refill processing and delivery.      Thank you,  Darlin Coco   [facility] Specialty Technician

## 2017-04-18 MED FILL — GENGRAF/25MG/CAPS: GENGRAF/25MG/CAPS | 30 days supply | Qty: 180 | Fill #10

## 2017-04-18 MED FILL — GENGRAF/100MG/CAPS: GENGRAF/100MG/CAPS | 30 days supply | Qty: 60 | Fill #10

## 2017-04-18 MED FILL — MYCOPHENOLATE MOFETIL/250MG/CAPS: MYCOPHENOLATE MOFETIL/250MG/CAPS | 30 days supply | Qty: 120 | Fill #10

## 2017-04-19 MED ORDER — SODIUM BICARBONATE 650 MG TABLET
ORAL_TABLET | Freq: Three times a day (TID) | ORAL | 3 refills | 0 days | Status: CP
Start: 2017-04-19 — End: 2017-07-16

## 2017-04-19 NOTE — Unmapped (Signed)
Patient request for RX refill.

## 2017-04-19 NOTE — Unmapped (Signed)
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 He also has a history of a class I donor specific antibodies, but has no history of rejection, significant proteinuria, and stable serum creatinine in the range of approximately 1.6-2.4 mg/dL.    Interval History:  He presents today feeling well. He has not seen anyone or had any labs in 6 months. He reports occasional lower extremity edema when he is on his feet all day. He took his cyclosporine this morning at [facility] so his level is not an accurate trough today. He denies fever, chills, shortness of breath, or palpitations. Patient reports that he might have gotten a second shot of Shingrix, but he is not sure. He did receive his flu vaccine.  He reports not being able to get Kayexalate due to a Sport and exercise psychologist.  His coughing and sneezing have resolved.    His home blood sugars have been about 90-100 typically, with a high reading of 140 this morning. His son died recently from an MI.  He is handling this as well as can be expected.    Review of Systems     All other systems are reviewed and are negative.  A 10 systems review was completed.    Medications    Current Outpatient Prescriptions   Medication Sig Dispense Refill   ??? aspirin, buffered 81 mg Tab Take 81 mg by mouth. Frequency:QD   Dosage:81   MG  Instructions:  Note:Dose: 81MG      ??? cholecalciferol, vitamin D3, (VITAMIN D3) 1,000 unit capsule Take 2 capsules (2,000 Units total) by mouth daily. 60 capsule 11   ??? cycloSPORINE modified (GENGRAF) 100 MG capsule Pt. to take 100 mg BID. Dx code Z94.0, TP date: 7.11.08. Discharge date: 7.18.08. Had part a at time of TP 60 capsule 11   ??? cycloSPORINE modified (GENGRAF) 25 MG capsule Pt. To take 3 capsules (75 mg) BID. DX code Z94.0, TP date:7.11.08 discharge date:7.18.08. Had part A at time of TP. 180 capsule 11   ??? docusate sodium (COLACE) 100 MG capsule Take 100 mg by mouth. Frequency:BID   Dosage:100   MG  Instructions:  Note:Dose: 100MG      ??? enalapril (VASOTEC) 10 MG tablet Take 2 tablets (20 mg total) by mouth Two (2) times a day. Frequency:BID   Dosage:20   MG  Instructions:  Note:Dose: 20MG  360 tablet 3   ??? furosemide (LASIX) 20 MG tablet TAKE 1 TABLET BY MOUTH DAILY AS NEEDED 30 tablet 4   ??? hydrALAZINE (APRESOLINE) 25 MG tablet TAKE 3 TABLETS (75 MG TOTAL) BY MOUTH THREE (3) TIMES A DAY. 270 tablet 6   ??? hydroCHLOROthiazide (HYDRODIURIL) 50 MG tablet Take 1 tablet (50 mg total) by mouth daily. 90 tablet 3   ??? hydroCHLOROthiazide (HYDRODIURIL) 50 MG tablet TAKE 1 TABLET (50 MG TOTAL) BY MOUTH DAILY. 90 tablet 3   ??? lancets Misc (E11.39); NPI: [id number]. Check blood sugar 3 times daily before meals 100 each 11   ??? MEDICAL SUPPLY ITEM (E11.39); NPI: [id number]. Check blood sugar 3 times daily before meals 1 Package 0   ??? metoprolol tartrate (LOPRESSOR) 50 MG tablet TAKE 1 TABLET BY MOUTH TWICE A DAY 180 tablet 3   ??? mycophenolate (CELLCEPT) 250 mg capsule Take 2 capsules (  500 mg total) by mouth Two (2) times a day. diagnosis code: Z94.0 120 capsule 11   ??? omeprazole (PRILOSEC) 40 MG capsule TAKE 1 CAPSULE (40 MG TOTAL) BY MOUTH DAILY. 30 capsule 11   ??? sodium bicarbonate 650 mg tablet TAKE 1 TABLET (650 MG TOTAL) BY MOUTH THREE (3) TIMES A DAY. 90 tablet 3   ??? sodium polystyrene sulfonate (KIONEX, WITH SORBITOL,) 15 gram/60 mL Susp Take 60 mL (15 g total) by mouth daily as needed. 360 mL 2   ??? traMADol (ULTRAM) 50 mg tablet Take 1 tablet (50 mg total) by mouth every six (6) hours as needed for pain. 120 tablet 5   ??? VENTOLIN HFA 90 mcg/actuation inhaler INHALE 2 PUFFS BY MOUTH 3 TIMES A DAY AND EVERY 2 TO 4 HOURSS AS NEEDED 18 Inhaler 3     Current Facility-Administered Medications   Medication Dose Route Frequency Provider Last Rate Last Dose   ??? albuterol (PROVENTIL HFA;VENTOLIN HFA) 90 mcg/actuation inhaler 2 puff  2 puff Inhalation Q6H PRN Samanatha Brammer Ewing Schlein, MD           Physical Exam  There were no vitals taken for this visit.  General: Patient is a pleasant male in no apparent distress.  Eyes: Sclera anicteric.  Neck: Supple without LAD/JVD/bruits. Tick bite on back of right neck, resolved.  Lungs: Clear to auscultation bilaterally.  Cardiovascular: grade 1/6 systolic murmur  Abdomen: Soft, notender/nondistended. Positive bowel sounds. No hepatosplenomegaly, masses or bruits appreciated.  Extremities: Bilateral 1+ edema of lower extremities.  Skin: Without rash  Neurological: Grossly nonfocal.  Psychiatric: Mood and affect appropriate.    Laboratory Results    Recent Results (from the past 170 hour(s))   Phosphorus Level    Collection Time: 04/20/17  8:23 AM   Result Value Ref Range    Phosphorus 4.0 2.9 - 4.7 mg/dL   Magnesium Level    Collection Time: 04/20/17  8:23 AM   Result Value Ref Range    Magnesium 1.2 (L) 1.6 - 2.2 mg/dL   Comprehensive Metabolic Panel    Collection Time: 04/20/17  8:23 AM   Result Value Ref Range    Sodium 142 135 - 145 mmol/L    Potassium 6.0 (H) 3.5 - 5.0 mmol/L    Chloride 111 (H) 98 - 107 mmol/L    CO2 22.0 22.0 - 30.0 mmol/L    BUN 51 (H) 7 - 21 mg/dL    Creatinine 1.30 (H) 0.70 - 1.30 mg/dL    BUN/Creatinine Ratio 23     EGFR MDRD Non Af Amer 30 (L) >=60 mL/min/1.74m2    EGFR MDRD Af Amer 36 (L) >=60 mL/min/1.9m2    Anion Gap 9 9 - 15 mmol/L    Glucose 127 65 - 179 mg/dL    Calcium 86.5 8.5 - 78.4 mg/dL    Albumin 4.4 3.5 - 5.0 g/dL    Total Protein 7.5 6.5 - 8.3 g/dL    Total Bilirubin 0.7 0.0 - 1.2 mg/dL    AST 14 (L) 19 - 55 U/L    ALT 20 19 - 72 U/L    Alkaline Phosphatase 72 38 - 126 U/L   Cyclosporine, Trough    Collection Time: 04/20/17  8:23 AM   Result Value Ref Range    Cyclosporine, Trough 554 <=800 ng/mL   Vitamin D 25 Hydroxy (25OH D2 + D3)    Collection Time: 04/20/17  8:23 AM   Result Value Ref Range    Vitamin  D Total (25OH) 27.4 20.0 - 80.0 ng/mL   CBC w/ Differential    Collection Time: 04/20/17  8:23 AM   Result Value Ref Range    WBC 3.6 (L) 4.5 - 11.0 10*9/L    RBC 4.81 4.50 - 5.90 10*12/L    HGB 12.2 (L) 13.5 - 17.5 g/dL    HCT 16.1 (L) 09.6 - 53.0 %    MCV 82.1 80.0 - 100.0 fL    MCH 25.4 (L) 26.0 - 34.0 pg    MCHC 30.9 (L) 31.0 - 37.0 g/dL    RDW 04.5 (H) 40.9 - 15.0 %    MPV 8.4 7.0 - 10.0 fL    Platelet 172 150 - 440 10*9/L    Absolute Neutrophils 2.4 2.0 - 7.5 10*9/L    Absolute Lymphocytes 0.8 (L) 1.5 - 5.0 10*9/L    Absolute Monocytes 0.3 0.2 - 0.8 10*9/L    Absolute Eosinophils 0.1 0.0 - 0.4 10*9/L    Absolute Basophils 0.0 0.0 - 0.1 10*9/L    Large Unstained Cells 3 0 - 4 %    Microcytosis Slight (A) Not Present    Hypochromasia Marked (A) Not Present   Morphology Review    Collection Time: 04/20/17  8:23 AM   Result Value Ref Range    Smear Review Comments See Comment (A) Undefined   Urinalysis    Collection Time: 04/20/17  9:49 AM   Result Value Ref Range    Color, UA Yellow     Clarity, UA Clear     Specific Gravity, UA 1.011 1.003 - 1.030    pH, UA 6.0 5.0 - 9.0    Leukocyte Esterase, UA Negative Negative    Nitrite, UA Negative Negative    Protein, UA Negative Negative    Glucose, UA Negative Negative    Ketones, UA Negative Negative    Urobilinogen, UA 2.0 mg/dL (A) 0.2 mg/dL, 1.0 mg/dL    Bilirubin, UA Negative Negative    Blood, UA Negative Negative    RBC, UA <1 <3 /HPF    WBC, UA <1 <2 /HPF    Squam Epithel, UA <1 0 - 5 /HPF    Bacteria, UA None Seen None Seen /HPF   Protein/Creatinine Ratio, Urine    Collection Time: 04/20/17  9:49 AM   Result Value Ref Range    Creat U 63.4 Undefined mg/dL    Protein, Ur 81.1 Undefined mg/dL    Protein/Creatinine Ratio, Urine 0.213 Undefined     Assessment and Plan    Andrew Diaz is a 61 y.o. 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 Active medical issues include:    1. 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 Renal function remains stable with today's creatinine of 2.25 mg/dL within his baseline range of 1.7-2.4 mg/dL. Though he has class I donor specific antibodies to HLA-A1 and HLA-B8 he has no history of rejection.      2. Immunosuppression management. I will continue the current immunosuppressive regimen  targeting cyclosporine levels of approximately 75-125. Today's level is not a trough level as he took his cyclosporin at 5Am this morning.  CellCept will be continued at a dosage of 500 mg BID and cyclosporine at the current dose.    3. Hypertension, controlled. He will continue his current regimen.     4. Hyperlipidemia. I will continue to hold Pravachol due to past myalgias with statins.     5. 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 I will continue sodium bicarbonate 650 mg TID. He will restart Kayexalate (patient has some powder at home).    6. Diabetes mellitus. Hemoglobin A1c level today is pending. He will continue current therapy.     7. Cancer screens. Patient had a colonoscopy revealing no polyps in 2016. Renal US on 06/22/16 revealed no masses.     8. Immunizations. Patient received first dose of Shingrix in June 2018 and he will receive the second dose today. He is up to date on all vaccines.    9.. Follow-up. The patient will be seen again in approximately 6 months.      Scribe's Attestation: Jackey Loge, MD obtained and performed the history, physical exam and medical decision making elements that were entered into the chart.  Signed by [place] Ormond Foster, Scribe, on April 20, 2017 9:40 AM.    ----------------------------------------------------------------------------------------------------------------------  April 20, 2017 4:36 PM. Documentation assistance provided by the Scribe. I was present during the time the encounter was recorded. The information recorded by the Scribe was done at my direction and has been reviewed and validated by me.  ----------------------------------------------------------------------------------------------------------------------

## 2017-04-20 ENCOUNTER — Ambulatory Visit
Admission: RE | Admit: 2017-04-20 | Discharge: 2017-04-20 | Disposition: A | Discharge status: Home / Self Care | Attending: Nephrology

## 2017-04-20 ENCOUNTER — Ambulatory Visit
Admission: RE | Admit: 2017-04-20 | Discharge: 2017-04-20 | Disposition: A | Discharge status: Home / Self Care | Payer: MEDICARE

## 2017-04-20 DIAGNOSIS — E118 Type 2 diabetes mellitus with unspecified complications: Secondary | ICD-10-CM

## 2017-04-20 DIAGNOSIS — Z940 Kidney transplant status: Principal | ICD-10-CM

## 2017-04-20 DIAGNOSIS — E559 Vitamin D deficiency, unspecified: Secondary | ICD-10-CM

## 2017-04-20 DIAGNOSIS — Z48298 Encounter for aftercare following other organ transplant: Secondary | ICD-10-CM

## 2017-04-20 DIAGNOSIS — D899 Disorder involving the immune mechanism, unspecified: Secondary | ICD-10-CM

## 2017-04-20 LAB — SMEAR REVIEW

## 2017-04-20 LAB — CBC W/ AUTO DIFF
EOSINOPHILS ABSOLUTE COUNT: 0.1 10*9/L (ref 0.0–0.4)
HEMATOCRIT: 39.5 % — ABNORMAL LOW (ref 41.0–53.0)
LARGE UNSTAINED CELLS: 3 % (ref 0–4)
LYMPHOCYTES ABSOLUTE COUNT: 0.8 10*9/L — ABNORMAL LOW (ref 1.5–5.0)
MEAN CORPUSCULAR HEMOGLOBIN CONC: 30.9 g/dL — ABNORMAL LOW (ref 31.0–37.0)
MEAN CORPUSCULAR HEMOGLOBIN: 25.4 pg — ABNORMAL LOW (ref 26.0–34.0)
MEAN CORPUSCULAR VOLUME: 82.1 fL (ref 80.0–100.0)
MEAN PLATELET VOLUME: 8.4 fL (ref 7.0–10.0)
MONOCYTES ABSOLUTE COUNT: 0.3 10*9/L (ref 0.2–0.8)
NEUTROPHILS ABSOLUTE COUNT: 2.4 10*9/L (ref 2.0–7.5)
PLATELET COUNT: 172 10*9/L (ref 150–440)
RED BLOOD CELL COUNT: 4.81 10*12/L (ref 4.50–5.90)
RED CELL DISTRIBUTION WIDTH: 15.4 % — ABNORMAL HIGH (ref 12.0–15.0)
WBC ADJUSTED: 3.6 10*9/L — ABNORMAL LOW (ref 4.5–11.0)

## 2017-04-20 LAB — URINALYSIS
BACTERIA: NONE SEEN /HPF
BILIRUBIN UA: NEGATIVE
BLOOD UA: NEGATIVE
GLUCOSE UA: NEGATIVE
KETONES UA: NEGATIVE
LEUKOCYTE ESTERASE UA: NEGATIVE
NITRITE UA: NEGATIVE
PH UA: 6 (ref 5.0–9.0)
PROTEIN UA: NEGATIVE
RBC UA: <1 /HPF (ref <3)
SQUAMOUS EPITHELIAL: <1 /HPF (ref 0–5)
UROBILINOGEN UA: 2 — AB
WBC UA: <1 /HPF (ref <2)

## 2017-04-20 LAB — COMPREHENSIVE METABOLIC PANEL
ALBUMIN: 4.4 g/dL (ref 3.5–5.0)
ALKALINE PHOSPHATASE: 72 U/L (ref 38–126)
ALT (SGPT): 20 U/L (ref 19–72)
ANION GAP: 9 mmol/L (ref 9–15)
AST (SGOT): 14 U/L — ABNORMAL LOW (ref 19–55)
BILIRUBIN TOTAL: 0.7 mg/dL (ref 0.0–1.2)
BLOOD UREA NITROGEN: 51 mg/dL — ABNORMAL HIGH (ref 7–21)
BUN / CREAT RATIO: 23
CALCIUM: 10.2 mg/dL (ref 8.5–10.2)
CHLORIDE: 111 mmol/L — ABNORMAL HIGH (ref 98–107)
CO2: 22 mmol/L (ref 22.0–30.0)
CREATININE: 2.25 mg/dL — ABNORMAL HIGH (ref 0.70–1.30)
EGFR MDRD AF AMER: 36 mL/min/{1.73_m2} — ABNORMAL LOW (ref >=60)
EGFR MDRD NON AF AMER: 30 mL/min/{1.73_m2} — ABNORMAL LOW (ref >=60)
GLUCOSE RANDOM: 127 mg/dL (ref 65–179)
POTASSIUM: 6 mmol/L — ABNORMAL HIGH (ref 3.5–5.0)
PROTEIN TOTAL: 7.5 g/dL (ref 6.5–8.3)
SODIUM: 142 mmol/L (ref 135–145)

## 2017-04-20 LAB — CYCLOSPORINE, TROUGH: Lab: 554

## 2017-04-20 LAB — VITAMIN D, TOTAL (25OH): Lab: 27.4

## 2017-04-20 LAB — PROTEIN / CREATININE RATIO, URINE
PROTEIN URINE: 13.5 mg/dL
PROTEIN/CREAT RATIO, URINE: 0.213

## 2017-04-20 LAB — PHOSPHORUS: Phosphate:MCnc:Pt:Ser/Plas:Qn:: 4

## 2017-04-20 LAB — GLUCOSE UA: Lab: NEGATIVE

## 2017-04-20 LAB — MONOCYTES ABSOLUTE COUNT: Lab: 0.3

## 2017-04-20 LAB — MAGNESIUM: Magnesium:MCnc:Pt:Ser/Plas:Qn:: 1.2 — ABNORMAL LOW

## 2017-04-20 LAB — CO2: Carbon dioxide:SCnc:Pt:Ser/Plas:Qn:: 22

## 2017-04-20 LAB — PROTEIN URINE: Protein:MCnc:Pt:Urine:Qn:: 13.5

## 2017-04-20 MED ORDER — SODIUM POLYSTYRENE SULFONATE 15 GRAM/60 ML ORAL SUSPENSION
Freq: Every day | ORAL | 2 refills | 0 days | Status: CP | PRN
Start: 2017-04-20 — End: 2017-08-02

## 2017-04-20 MED ORDER — TRAMADOL 50 MG TABLET
ORAL_TABLET | Freq: Four times a day (QID) | ORAL | 5 refills | 0.00000 days | Status: CP | PRN
Start: 2017-04-20 — End: 2017-04-20

## 2017-04-20 MED ORDER — TRAMADOL 50 MG TABLET: 50 mg | tablet | Freq: Four times a day (QID) | 5 refills | 0 days | Status: AC

## 2017-04-21 LAB — CMV DNA, QUANTITATIVE, PCR

## 2017-04-21 LAB — CMV COMMENT: Lab: 0

## 2017-04-21 LAB — HEMOGLOBIN A1C
HEMOGLOBIN A1C: 6.1 % — ABNORMAL HIGH (ref 4.8–5.6)
Hemoglobin A1c/Hemoglobin.total:MFr:Pt:Bld:Qn:: 6.1 — ABNORMAL HIGH

## 2017-04-21 NOTE — Unmapped (Signed)
Cyclosporine level from today was not  Trough. He took his Gengraf at 0500.

## 2017-04-29 LAB — VITAMIN D 1,25-DIHYDROXY: 1,25-Dihydroxyvitamin D:MCnc:Pt:Ser/Plas:Qn:: 30

## 2017-04-29 MED ORDER — ENALAPRIL MALEATE 10 MG TABLET
ORAL_TABLET | 3 refills | 0 days | Status: CP
Start: 2017-04-29 — End: 2017-05-13

## 2017-04-29 NOTE — Unmapped (Signed)
Patient request for RX refill.

## 2017-05-03 LAB — DONOR HLA-B ANTIGEN #2: Lab: 18

## 2017-05-03 LAB — HLA DS POST TRANSPLANT
ANTI-DONOR HLA-A #2 MFI: 66 MFI
ANTI-DONOR HLA-B #1 MFI: 4491 MFI — ABNORMAL HIGH
ANTI-DONOR HLA-B #2 MFI: 49 MFI
ANTI-DONOR HLA-C #1 MFI: 1257 MFI — ABNORMAL HIGH
ANTI-DONOR HLA-DQB #1 MFI: 161 MFI
ANTI-DONOR HLA-DQB #2 MFI: 245 MFI
ANTI-DONOR HLA-DR #1 MFI: 132 MFI
ANTI-DONOR HLA-DR #2 MFI: 119 MFI
DONOR DRW ANTIGEN #1: 52
DONOR HLA-A ANTIGEN #1: 1
DONOR HLA-A ANTIGEN #2: 2
DONOR HLA-B ANTIGEN #1: 8
DONOR HLA-B ANTIGEN #2: 18
DONOR HLA-C ANTIGEN #1: 7
DONOR HLA-DQB ANTIGEN #1: 2
DONOR HLA-DQB ANTIGEN #2: 7
DONOR HLA-DR ANTIGEN #1: 17
DONOR HLA-DR ANTIGEN #2: 11

## 2017-05-03 LAB — FSAB CLASS 2 ANTIBODY SPECIFICITY

## 2017-05-03 LAB — HLA CL2 AB COMMENT: Lab: 0

## 2017-05-03 LAB — FSAB CLASS 1 ANTIBODY SPECIFICITY: HLA CLASS 1 ANTIBODY RESULT: POSITIVE

## 2017-05-03 LAB — HLA CLASS 1 ANTIBODY RESULT: Lab: POSITIVE

## 2017-05-13 MED ORDER — CYCLOSPORINE MODIFIED 100 MG CAPSULE: 100 mg | each | 11 refills | 0 days

## 2017-05-13 MED ORDER — CYCLOSPORINE MODIFIED 25 MG CAPSULE
ORAL | 11 refills | 0.00000 days | Status: CP
Start: 2017-05-13 — End: 2017-05-13

## 2017-05-13 MED ORDER — CYCLOSPORINE MODIFIED 25 MG CAPSULE: each | 11 refills | 0 days | Status: AC

## 2017-05-13 MED ORDER — MYCOPHENOLATE MOFETIL 250 MG CAPSULE: capsule | 11 refills | 0 days

## 2017-05-13 MED ORDER — MYCOPHENOLATE MOFETIL 250 MG CAPSULE
ORAL_CAPSULE | Freq: Two times a day (BID) | ORAL | 11 refills | 0.00000 days | Status: CP
Start: 2017-05-13 — End: 2018-05-15

## 2017-05-13 MED ORDER — CYCLOSPORINE MODIFIED 100 MG CAPSULE: capsule | 11 refills | 0 days | Status: AC

## 2017-05-13 MED ORDER — CYCLOSPORINE MODIFIED 25 MG CAPSULE: 75 mg | each | 11 refills | 0 days

## 2017-05-13 MED ORDER — MYCOPHENOLATE MOFETIL 250 MG CAPSULE: capsule | 11 refills | 0 days | Status: AC

## 2017-05-13 MED ORDER — CYCLOSPORINE MODIFIED 25 MG CAPSULE: capsule | 11 refills | 0 days | Status: AC

## 2017-05-13 MED ORDER — CYCLOSPORINE MODIFIED 100 MG CAPSULE
ORAL | 11 refills | 0.00000 days | Status: CP
Start: 2017-05-13 — End: 2017-05-13

## 2017-05-13 NOTE — Unmapped (Signed)
[facility] Refill Coordination Note  Specialty Medication(s): Mycophenolate 250mg  & Gengraf 25mg  & 100mg     Andrew Diaz [place], DOB: 03-12-1956  Phone: [phone] (home) , Alternate phone contact: N/A  Phone or address changes today?: No  All above HIPAA information was verified with patient.  Shipping Address: [address]  [place] [address]   Insurance changes? No    Completed refill call assessment today to schedule patient's medication shipment from the [facility] [phone]).      Confirmed the medication and dosage are correct and have not changed: Yes, regimen is correct and unchanged.    Confirmed patient started or stopped the following medications in the past month:  No, there are no changes reported at this time.    Are you tolerating your medication?:  Andrew Diaz reports tolerating the medication.    ADHERENCE    (Below is required for Medicare Part B or Transplant patients only - per drug):   How many tablets were dispensed last month:     Mycophenolate Mofetil 250 mg   Quantity filled last month: 120   # of tablets left on hand: 7 days    Gengraf 25 mg   Quantity filled last month: 180   # of tablets left on hand: 7 days    Gengraf 100 mg   Quantity filled last month: 60   # of tablets left on hand: 7 days    Did you miss any doses in the past 4 weeks? No missed doses reported.    FINANCIAL/SHIPPING    Delivery Scheduled: Yes, Expected medication delivery date: 05/19/2017     Andrew Diaz did not have any additional questions at this time.    Delivery address validated in FSI scheduling system: Yes, address listed in FSI is correct.    We will follow up with patient monthly for standard refill processing and delivery.      Thank you,  Tamala Fothergill   [facility] Shared [facility] Specialty Technician

## 2017-05-18 MED FILL — MYCOPHENOLATE MOFETIL/250MG/CAPS: MYCOPHENOLATE MOFETIL/250MG/CAPS | 30 days supply | Qty: 120 | Fill #0

## 2017-05-18 MED FILL — GENGRAF/100MG/CAPS: GENGRAF/100MG/CAPS | 30 days supply | Qty: 60 | Fill #0

## 2017-05-18 MED FILL — GENGRAF/25MG/CAPS: GENGRAF/25MG/CAPS | 30 days supply | Qty: 180 | Fill #0

## 2017-06-07 MED ORDER — METOPROLOL TARTRATE 50 MG TABLET
ORAL_TABLET | 3 refills | 0 days | Status: CP
Start: 2017-06-07 — End: 2018-07-14

## 2017-06-09 NOTE — Unmapped (Signed)
[facility] Refill Coordination Note  Specialty Medication(s): Mycophenolate 250mg , Gengraf 25mg  & 100mg     [name], DOB: 1955/11/07  Phone: [phone] (home) , Alternate phone contact: N/A  Phone or address changes today?: No  All above HIPAA information was verified with patient.  Shipping Address: [address]  [place] [address]   Insurance changes? No    Completed refill call assessment today to schedule patient's medication shipment from the [facility] [phone]).      Confirmed the medication and dosage are correct and have not changed: Yes, regimen is correct and unchanged.    Confirmed patient started or stopped the following medications in the past month:  No, there are no changes reported at this time.    Are you tolerating your medication?:  Holten reports tolerating the medication.    ADHERENCE    (Below is required for Medicare Part B or Transplant patients only - per drug):   How many tablets were dispensed last month:     Mycophenolate Mofetil 250 mg   Quantity filled last month: 120   # of tablets left on hand: 8 days    Gengraf 25 mg   Quantity filled last month: 180   # of tablets left on hand: 8 days    Gengraf 100 mg   Quantity filled last month: 60   # of tablets left on hand: 8 days    Did you miss any doses in the past 4 weeks? No missed doses reported.    FINANCIAL/SHIPPING    Delivery Scheduled: Yes, Expected medication delivery date: 06/17/2017     Ved did not have any additional questions at this time.    Delivery address validated in FSI scheduling system: Yes, address listed in FSI is correct.    We will follow up with patient monthly for standard refill processing and delivery.      Thank you,  Tamala Fothergill   [facility] Shared [facility] Specialty Technician

## 2017-06-16 MED FILL — GENGRAF/100MG/CAPS: GENGRAF/100MG/CAPS | 30 days supply | Qty: 60 | Fill #1

## 2017-06-16 MED FILL — GENGRAF/25MG/CAPS: GENGRAF/25MG/CAPS | 30 days supply | Qty: 180 | Fill #1

## 2017-06-29 NOTE — Unmapped (Signed)
[facility] Refill and Clinical Coordination Note  Medication(s): MYCOPHENOLATE    [name], DOB: 06-08-1955  Phone: [phone] (home) , Alternate phone contact: N/A  Shipping address: [address]  [place] [address]  Phone or address changes today?: No  All above HIPAA information verified.  Insurance changes? No    Completed refill and clinical call assessment today to schedule patient's medication shipment from the [facility] [phone]).      MEDICATION RECONCILIATION    Confirmed the medication and dosage are correct and have not changed: Yes, regimen is correct and unchanged.    Were there any changes to your medication(s) in the past month:  No, there are no changes reported at this time.    ADHERENCE    Is this medicine transplant or covered by Medicare Part B? Yes.    Mycophenolate Mofetil 250 mg   Quantity filled last month: 120 IN [place]   # of tablets left on hand: 7 DAYS LEFT (pt did not get shipment in Feb though last clinical note says it, only got gengraf, not mycophenolate)    Did you miss any doses in the past 4 weeks? No missed doses reported.  Adherence counseling provided? Not needed     SIDE EFFECT MANAGEMENT    Are you tolerating your medication?:  Andrew Diaz reports tolerating the medication.  Side effect management discussed: None      Therapy is appropriate and should be continued.    Evidence of clinical benefit: See Epic note from 04/20/17      FINANCIAL/SHIPPING    Delivery Scheduled: Yes, Expected medication delivery date: 07/01/17   Additional medications refilled: No additional medications/refills needed at this time.    The patient will receive an FSI print out for each medication shipped and additional FDA Medication Guides as required.  Patient education from [facility] or [facility]et Leu may also be included in the shipment.    [name] did not have any additional questions at this time.    Delivery address validated in FSI scheduling system: Yes, address listed above is correct.      We will follow up with patient monthly for standard refill processing and delivery.      Thank you,  Thad Ranger   [facility] Shared [facility] Specialty Pharmacist

## 2017-06-30 MED FILL — MYCOPHENOLATE MOFETIL/250MG/CAPS: MYCOPHENOLATE MOFETIL/250MG/CAPS | 30 days supply | Qty: 120 | Fill #1

## 2017-07-12 NOTE — Unmapped (Signed)
[facility] Refill Coordination Note  Specialty Medication(s): Gengraf 25mg  & 100mg   **NOTE: Patient also requested Vitamin D 1000iu**    [name], DOB: 24-Feb-1956  Phone: [phone] (home) , Alternate phone contact: N/A  Phone or address changes today?: No  All above HIPAA information was verified with patient.  Shipping Address: [address]  [place] [address]   Insurance changes? No    Completed refill call assessment today to schedule patient's medication shipment from the [facility] [phone]).      Confirmed the medication and dosage are correct and have not changed: Yes, regimen is correct and unchanged.    Confirmed patient started or stopped the following medications in the past month:  No, there are no changes reported at this time.    Are you tolerating your medication?:  Andrew Diaz reports tolerating the medication.    ADHERENCE    (Below is required for Medicare Part B or Transplant patients only - per drug):   How many tablets were dispensed last month:     Gengraf 25 mg   Quantity filled last month: 180   # of tablets left on hand: 7 DAYS    Gengraf 100 mg   Quantity filled last month: 60   # of tablets left on hand: 7 DAYS    Did you miss any doses in the past 4 weeks? No missed doses reported.    FINANCIAL/SHIPPING    Delivery Scheduled: Yes, Expected medication delivery date: 07/18/2017     The patient will receive an FSI print out for each medication shipped and additional FDA Medication Guides as required.  Patient education from [place] or [facility] Leu may also be included in the shipment    Andrew Diaz did not have any additional questions at this time.    Delivery address validated in FSI scheduling system: Yes, address listed in FSI is correct.    We will follow up with patient monthly for standard refill processing and delivery.      Thank you,  Tamala Fothergill   [facility] Shared [facility] Specialty Technician

## 2017-07-15 MED FILL — GENGRAF/100MG/CAPS: GENGRAF/100MG/CAPS | 30 days supply | Qty: 60 | Fill #2

## 2017-07-15 MED FILL — GENGRAF/25MG/CAPS: GENGRAF/25MG/CAPS | 30 days supply | Qty: 180 | Fill #2

## 2017-07-18 NOTE — Unmapped (Signed)
Pt request for RX Refill

## 2017-07-19 MED ORDER — SODIUM BICARBONATE 650 MG TABLET
ORAL_TABLET | Freq: Three times a day (TID) | ORAL | 3 refills | 0 days | Status: CP
Start: 2017-07-19 — End: 2017-11-13

## 2017-07-25 NOTE — Unmapped (Signed)
Pt. Called stating her he urinary frequency and burning. I recommended he go to the emergency room to ne evaluated.

## 2017-07-31 ENCOUNTER — Ambulatory Visit: Admit: 2017-07-31 | Discharge: 2017-08-02 | Disposition: A | Discharge status: Home / Self Care | Payer: MEDICARE

## 2017-07-31 DIAGNOSIS — T8613 Kidney transplant infection: Principal | ICD-10-CM

## 2017-07-31 LAB — URINALYSIS WITH CULTURE REFLEX
GLUCOSE UA: NEGATIVE
HYALINE CASTS: 5 /LPF — ABNORMAL HIGH (ref 0–1)
KETONES UA: NEGATIVE
NITRITE UA: NEGATIVE
PH UA: 5.5 (ref 5.0–9.0)
PROTEIN UA: 100 — AB
RBC UA: 62 /HPF — ABNORMAL HIGH (ref <=3)
SPECIFIC GRAVITY UA: 1.013 (ref 1.003–1.030)
SQUAMOUS EPITHELIAL: <1 /HPF (ref 0–5)
UROBILINOGEN UA: 2 — AB
WBC UA: 20 /HPF — ABNORMAL HIGH (ref <=2)

## 2017-07-31 LAB — PHOSPHORUS
PHOSPHORUS: 3.7 mg/dL (ref 2.9–4.7)
Phosphate:MCnc:Pt:Ser/Plas:Qn:: 3.7

## 2017-07-31 LAB — CBC W/ AUTO DIFF
BASOPHILS ABSOLUTE COUNT: 0 10*9/L (ref 0.0–0.1)
BASOPHILS RELATIVE PERCENT: 0.2 %
EOSINOPHILS ABSOLUTE COUNT: 0 10*9/L (ref 0.0–0.4)
EOSINOPHILS RELATIVE PERCENT: 0.4 %
HEMATOCRIT: 35.5 % — ABNORMAL LOW (ref 41.0–53.0)
HEMOGLOBIN: 10.9 g/dL — ABNORMAL LOW (ref 13.5–17.5)
LARGE UNSTAINED CELLS: 3 % (ref 0–4)
LYMPHOCYTES ABSOLUTE COUNT: 0.6 10*9/L — ABNORMAL LOW (ref 1.5–5.0)
LYMPHOCYTES RELATIVE PERCENT: 6.5 %
MEAN CORPUSCULAR HEMOGLOBIN CONC: 30.8 g/dL — ABNORMAL LOW (ref 31.0–37.0)
MEAN CORPUSCULAR HEMOGLOBIN: 24.7 pg — ABNORMAL LOW (ref 26.0–34.0)
MEAN CORPUSCULAR VOLUME: 80.3 fL (ref 80.0–100.0)
MEAN PLATELET VOLUME: 8.2 fL (ref 7.0–10.0)
MONOCYTES ABSOLUTE COUNT: 0.5 10*9/L (ref 0.2–0.8)
MONOCYTES RELATIVE PERCENT: 5.8 %
NEUTROPHILS RELATIVE PERCENT: 84.4 %
RED BLOOD CELL COUNT: 4.42 10*12/L — ABNORMAL LOW (ref 4.50–5.90)
RED CELL DISTRIBUTION WIDTH: 13.9 % (ref 12.0–15.0)
WBC ADJUSTED: 9.2 10*9/L (ref 4.5–11.0)

## 2017-07-31 LAB — COMPREHENSIVE METABOLIC PANEL
ALBUMIN: 3.3 g/dL — ABNORMAL LOW (ref 3.5–5.0)
ALT (SGPT): 24 U/L (ref 19–72)
ANION GAP: 15 mmol/L (ref 9–15)
AST (SGOT): 28 U/L (ref 19–55)
BILIRUBIN TOTAL: 1.1 mg/dL (ref 0.0–1.2)
BUN / CREAT RATIO: 22
CALCIUM: 9 mg/dL (ref 8.5–10.2)
CHLORIDE: 96 mmol/L — ABNORMAL LOW (ref 98–107)
CO2: 19 mmol/L — ABNORMAL LOW (ref 22.0–30.0)
CREATININE: 2.65 mg/dL — ABNORMAL HIGH (ref 0.70–1.30)
EGFR MDRD AF AMER: 30 mL/min/{1.73_m2} — ABNORMAL LOW (ref >=60)
EGFR MDRD NON AF AMER: 24 mL/min/{1.73_m2} — ABNORMAL LOW (ref >=60)
GLUCOSE RANDOM: 162 mg/dL (ref 65–179)
POTASSIUM: 4.7 mmol/L (ref 3.5–5.0)
PROTEIN TOTAL: 6.8 g/dL (ref 6.5–8.3)
SODIUM: 130 mmol/L — ABNORMAL LOW (ref 135–145)

## 2017-07-31 LAB — TOXIC VACUOLATION

## 2017-07-31 LAB — SLIDE REVIEW

## 2017-07-31 LAB — MAGNESIUM: Magnesium:MCnc:Pt:Ser/Plas:Qn:: 1.3 — ABNORMAL LOW

## 2017-07-31 LAB — POTASSIUM: Potassium:SCnc:Pt:Ser/Plas:Qn:: 4.7

## 2017-07-31 LAB — EOSINOPHILS ABSOLUTE COUNT: Lab: 0

## 2017-07-31 LAB — AMORPHOUS

## 2017-07-31 LAB — THYROID STIMULATING HORMONE: Thyrotropin:ACnc:Pt:Ser/Plas:Qn:: 0.059 — ABNORMAL LOW

## 2017-07-31 LAB — LACTATE BLOOD VENOUS: Lactate:SCnc:Pt:BldV:Qn:: 1

## 2017-07-31 NOTE — Unmapped (Signed)
Patient rounding complete, call bell in reach, bed locked and in lowest position, patient belongings at bedside and within reach of patient.  Patient updated on plan of care.

## 2017-07-31 NOTE — Unmapped (Signed)
Head of bed elevated, call light/ tv remote control within reach, patient belongings at bedside

## 2017-07-31 NOTE — Unmapped (Signed)
Patient returned from X-ray and Ultrasound  Transported by Radiology  How tranported Wheelchair  Cardiac Monitor no

## 2017-07-31 NOTE — Unmapped (Addendum)
[facility]  Emergency Department Provider Note    ??   ED Clinical Impression   ??  Final diagnoses:   Acute pyelonephritis (Primary)   Influenza A   Kidney replaced by transplant   Immunosuppressed status (CMS-HCC)       ??   Impression, ED Course, Assessment and Plan   ??  Impression: 62 y.o. male with a history of DM, HTN, hypercholesteremia, and s/p DCD kidney transplant (2008) presenting to the ED for 2 weeks of generalized weakness and decreased PO intake, fevers/chills. Differential includes infectious process including UTI, influenza, pyelonephritis, or partially treated UTI, viral syndrome including HSV viremia, and PNA. Plan to check labs including blood cultures, lactic acid, electrolytes. Will also obtain US and CXR. Will contact urgent care to obtain outside records-obtained and reviewed.    10:28 AM Urgent Care notes obtained which show patient's prior urine culture positive for >100,000 (07/25/2017) Klebsiella. Patient was subsequently put on a course of Macrobid x 7 days-pt states taking last doses today.   +partially treated UTI, + flu, slightly increased Cr from baseline of 2.0  12:49 PM called renal fellow, MAO  1:18 PM I spoke with renal fellow who is agreeable with the plan, rocephin only for now (have recent urine cx results)  1:25 PM renal med team in ED and seeing         Additional Medical Decision Making     I have reviewed the vital signs and the nursing notes. Labs and radiology results that were available during my care of the patient were independently reviewed by me and considered in my medical decision making.       I independently visualized the radiology images.   I reviewed the patient's prior medical records ([facility], Urgent Care notes).   I discussed the case with the MAO, med B, renal consultant.   I discussed the case and plan for continuity of care with the admitting provider.   I  Portions of this record have been created using Scientist, clinical (histocompatibility and immunogenetics). Dictation errors have been sought, but may not have been identified and corrected.  ____________________________________________         History   ??    Chief Complaint  Weakness      HPI   Andrew Diaz is a 62 y.o. male with a history of DM, HTN, hypercholesteremia, and s/p DCD kidney transplant (2008), on cyclosporine and CellCept, presenting to the ED for 1-2 weeks of generalized weakness and decreased PO intake. Patient reports having progressively worsening weakness, decreased PO intake, and decreased sleep over the past 2 weeks. He was seen at Urgent Care x1 week ago and was diagnosed with a UTI and placed on Macrobid. Patient endorses compliance with this medication. He states his hematuria, frequency, and dysuria has resolved. Today he endorses decreased urine output. He also endorses compliance with his transplant and other daily medications. He describes his weakness as feeling fatigued and unsteady on his feet. Patient states he now cannot walk a few steps without feeling weak and unsteady. Patient is able to walk independently without a walker or cane at baseline.  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 Endorses nausea without vomiting. Also endorses three episodes of loose, non-bloody diarrhea last night. Endorses tactile fevers and chills for the past week.  Endorses dry cough with associated shortness of breath for the past 3-4 days with rhinorrhea. Endorses intermittent low back  pain. Patient also reports developing a tender sore over his bottom lip, (may have had cold sores in remote past). Denies focal weakness, chest pain, abdominal pain, headache, or any other concerning symptoms. Denies pain over transplant site. Denies drug or alcohol use. He does not smoke. Endorses contact with a friend with URI symptoms but otherwise no known sick contacts with similar symptoms.        Past Medical History:   Diagnosis Date   ??? Cataract    ??? Diabetes mellitus (CMS-HCC) ??? Diabetic retinopathy (CMS-HCC)    ??? GERD (gastroesophageal reflux disease)    ??? Hypercholesteremia    ??? Hypertension        Patient Active Problem List   Diagnosis   ??? Essential hypertension   ??? Hypercholesterolemia   ??? 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 Senile nuclear sclerosis   ??? Vitreous degeneration   ??? Vitreous hemorrhage (CMS-HCC)   ??? Kidney replaced by transplant   ??? Aftercare following organ transplant   ??? Cataract extraction status   ??? Lens replaced by other means   ??? Immunosuppressed status (CMS-HCC)   ??? Influenza   ??? UTI (urinary tract infection)       Past Surgical History:   Procedure Laterality Date   ??? CHOLECYSTECTOMY     ??? NEPHRECTOMY TRANSPLANTED ORGAN     ??? PR COLONOSCOPY FLX DX W/COLLJ SPEC WHEN PFRMD N/A 10/14/2014    Procedure: COLONOSCOPY, FLEXIBLE, PROXIMAL TO SPLENIC FLEXURE; DIAGNOSTIC, W/WO COLLECTION SPECIMEN BY BRUSH OR WASH;  Surgeon: Donneta Romberg, MD;  [facility]: [facility];  Service: Gastroenterology   ??? PR EYE SURG POST SGMT PROC UNLISTED Left 06/01/10    PPV/EL/MP   ??? PR REMV CATARACT EXTRACAP,INSERT LENS Left 12/13/2012    Procedure: EXTRACAPSULAR CATARACT REMOVAL W/INSERTION OF INTRAOCULAR LENS PROSTHESIS, MANUAL OR MECHANICAL TECHNIQUE OS ZCB00 22.5 and ZA9003 22.0 and 21.5; 0.7 mm phaco with gold sleeve, 1.1 mm slit knife, 2.2 mm keratome;  Surgeon: Nadine Counts, MD;  Location: [facility];  Service: Ophthalmology   ??? PR TRABECULOPLASTY BY LASER SURGERY Bilateral     PRP         Current Facility-Administered Medications:   ???  acetaminophen (TYLENOL) tablet 650 mg, 650 mg, Oral, Q4H PRN, Garth Schlatter, MD, 650 mg at 07/31/17 1939  ???  albuterol 2.5 mg /3 mL (0.083 %) nebulizer solution 2.5 mg, 2.5 mg, Nebulization, 4x Daily (RT), Garth Schlatter, MD, 2.5 mg at 08/01/17 1714  ???  cefdinir (OMNICEF) capsule 300 mg, 300 mg, Oral, Q24H SCH, Garth Schlatter, MD, 300 mg at 08/01/17 1321  ???  cholecalciferol (vitamin D3) tablet 2,000 Units, 2,000 Units, Oral, Daily, Garth Schlatter, MD, 2,000 Units at 08/01/17 [phone]  ???  cycloSPORINE modified (NEORAL) capsule 175 mg, 175 mg, Oral, BID, Garth Schlatter, MD, 175 mg at 08/01/17 0850  ???  guaiFENesin (HUMIBID 3) tablet 400 mg, 400 mg, Oral, Q4H SCH, Garth Schlatter, MD, 400 mg at 08/01/17 1715  ???  hydrALAZINE (APRESOLINE) tablet 75 mg, 75 mg, Oral, TID, Garth Schlatter, MD, 75 mg at 08/01/17 1320  ???  menthol (COUGH DROPS) lozenge 1 lozenge, 1 lozenge, Oral, Q2H PRN, Garth Schlatter, MD  ???  metoprolol tartrate (LOPRESSOR) tablet 50 mg, 50 mg, Oral, BID, Garth Schlatter, MD, 50 mg at 08/01/17 9604  ???  mycophenolate (CELLCEPT) capsule 500 mg, 500 mg, Oral, BID, Garth Schlatter, MD,  500 mg at 08/01/17 0842  ???  ondansetron (ZOFRAN-ODT) disintegrating tablet 4 mg, 4 mg, Oral, Q8H PRN, Garth Schlatter, MD  ???  oseltamivir (TAMIFLU) capsule 30 mg, 30 mg, Oral, BID, Tonye Royalty, MD  ???  sodium bicarbonate tablet 650 mg, 650 mg, Oral, TID, Garth Schlatter, MD, 650 mg at 08/01/17 1321    Allergies  Pollen extracts    Family History   Problem Relation Age of Onset   ??? Diabetes Mother    ??? Diabetes Father    ??? Heart disease Father    ??? Glaucoma Neg Hx    ??? Retinal detachment Neg Hx    ??? Strabismus Neg Hx    ??? Macular degeneration Neg Hx    ??? Amblyopia Neg Hx    ??? Blindness Neg Hx        Social History  Social History   Substance Use Topics   ??? Smoking status: Never Smoker   ??? Smokeless tobacco: Never Used   ??? Alcohol use No       Review of Systems   Constitutional: +tactile fevers and chills, generalized weakness, decreased PO intake, decreased sleep  Eyes: Negative for visual changes.  ENT: +rhinorrhea. Negative for sore throat.  Cardiovascular: Negative for chest pain.  Respiratory: + cough, shortness of breath.  Gastrointestinal: +nausea and diarrhea. Negative for abdominal pain or vomiting.  Genitourinary: +decreased urine output. Negative for dysuria, hematuria, or frequency.  Musculoskeletal: +back pain.  Skin: +sore to lower lip. Negative for rash.  Neurological: Negative for headaches, focal weakness or numbness.       Physical Exam     ED Triage Vitals [07/31/17 0908]   Enc Vitals Group      BP 154/63      Pulse       SpO2 Pulse 89      Resp 18      Temp 37.3 ??C (99.2 ??F)      Temp Source Oral      SpO2 100 %       Constitutional: Alert and oriented. Well appearing and in no distress.  Chronically ill appearing  Eyes: Conjunctivae are normal.  ENT       Head: Normocephalic and atraumatic.       Nose: No congestion.       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   Mucous membranes are moist.       Neck: No stridor.  Hematological/Lymphatic/Immunilogical: No cervical lymphadenopathy.  Cardiovascular: Normal rate, regular rhythm. Normal and symmetric distal pulses are present in all extremities.  Respiratory: Normal respiratory effort. Breath sounds are normal.  Gastrointestinal: Soft and nontender. There is no CVA tenderness.No pain at transplant site RLQ  Musculoskeletal: Normal range of motion in all extremities. Trace peripheral edema bilaterally without tenderness..  Neurologic: Normal speech and language. No gross focal neurologic deficits are appreciated.  Skin: Skin is warm, dry and intact. No rash noted.  Psychiatric: Mood and affect are normal. Speech and behavior are normal.     EKG          Radiology       FINDINGS:   Lungs are well-inflated. No focal consolidation or pulmonary edema.    No pneumothorax or sizable pleural effusion.    Cardiomediastinal silhouette is within normal limits.    Chronic appearing posterior left fifth through seventh rib fractures.      Impression     No  acute airspace disease.            Procedures     Documentation assistance was provided by Shelby Dubin, Scribe, on July 31, 2017 at 9:33 AM for Sherrie Mustache, MD.  July 31, 2017 11:39 AM. Documentation assistance provided by the scribe. I was present during the time the encounter was recorded. The information recorded by the scribe was done at my direction and has been reviewed and validated by me.        Buford Dresser, MD  08/01/17 1945       Buford Dresser, MD  08/01/17 [phone]

## 2017-07-31 NOTE — Unmapped (Signed)
Patient transported to Ultrasound  Transported by Radiology  How tranported Wheelchair  Cardiac Monitor no

## 2017-07-31 NOTE — Unmapped (Addendum)
Family at bedside. 

## 2017-07-31 NOTE — Unmapped (Signed)
A mask worn by this staff

## 2017-07-31 NOTE — Unmapped (Signed)
General Medicine History and Physical    Assessment/Plan:    Principal Problem:    UTI (urinary tract infection)  Active Problems:    Essential hypertension    Kidney replaced by transplant    Influenza      Andrew Diaz is a 62 y.o. 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    UTI in Immunocompromised Patient  Previous UCx w/ Klebsiella (R: Augmentin/Cefazolin) treated with Macrobid x 6 days with continued evidence of UTI on repeat UA. I have a low suspicion for true upper pole infection but given he is overall well appearing and his lack of CVA tenderness.  Lactate normal at 1.0.  Nonseptic. Given his transplant and immunocompromised state will need to treat as complicated UTI. Repeating UCx and treating w/ Ceftriaxone per sensitives.  - Ceftriaxone 1g q24 for now  ??? May narrow to oral 3rd gen cephalosporin  with plans for 10-14 day course  - F/up UCx and BCx    Influenza  Influenza A positive.  Breathing comfortably on room air.  Based on symptom onset he is outside of the Tamiflu window but given immunocompromise state could benefit from this medication for prevention of bacterial superinfection.  ???Tamiflu 75 mg BID x5 days    AKI on Renal Transplant d/t Diabetic Nephropathy  Patient of Dr. Margaretmary Diaz.  Transplant in 2008.  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 Mild elevation in creatinine to 2.65 w/ Na+ 130. Suspect hypovolemic hyponatremia. Likely d/t dehydration with decreased PO and mild diarrhea.  - S/p 1L bolus in ED and start NS @ 150 x 10 hours  - Repeat BMP in AM  - CTN Cyclosporine 175 mg BID and Cellcept 500 mg BID  - Check trough level in AM    DMT  Last A1c was 6.1 in 04/2017. No anti-glycemic medications.  - Hold on SSI for now    HTN  Moderate HTN on admission. Holding home Lasix, HCTZ, ACE given dehydration.  - CTN Hydral and Metoprolol    Pressure Ulcer(s)    Active Pressure Ulcer     None              Global  - FEN/GI: General diet and replete PRN  - PPx: SCD's  - Code Status: Full  - Access: PIV  - Dispo: Floor  ___________________________________________________________________    Chief Complaint:  Chief Complaint   Patient presents with   ??? Weakness     UTI (urinary tract infection)    HPI:  Andrew Diaz is a 62 y.o. male with PMH of renal transplant in 2008 d/t diabetic nephropathy, DMT, HTN and recent UTI treated with Macrobid who presents with generalized fatigue and weakness/generalized fatigue    Patient notes approximate 2 week history of generalized weakness and fatigue associated with decreased oral intake. He was seen at urgent care on 07/25/2017 for symptoms of dysuria/hematuria and diagnosed with a UTI. Urine culture at that time grew >100K Klebsiella resistant only the Augmentin/Cefazolin. Prior to culture results he was empirically treated with Macrobid 100 mg BID x7 days. He says his dysuria resolved but his hematuria has continued. He has left sided back pain which he  says started about a week ago.    About 3 days ago he developed a non-productive cough. Has had shakes/chills/body aches. Alternating between feeling hot/cold. Has not measured his temperature at home.  Reports several episodes of non-bloody loose stool over the last 3 days.  He did receive a flu shot. One recent sick contact.    In the ED patient was afebrile with a normal heart rate, moderated HTN to 150-170 systolic and saturating well on RA. UA was c/w continued infection.  Renal transplant ultrasound with Doppler demonstrated mildly elevated resistive indices consistent with previous.  Chest x-ray was without acute process.  Blood and urine cultures were taken.  He received a 1 L NS bolus and was started on ceftriaxone and Tamiflu shortly after evaluation.    Allergies:  Pollen extracts    Medications:   Prior to Admission medications    Medication Dose, Route, Frequency   aspirin, buffered 81 mg Tab 81 mg   cholecalciferol, vitamin D3, (VITAMIN D3) 1,000 unit capsule 2,000 Units, Oral, Daily (standard)   cycloSPORINE modified (GENGRAF) 100 MG capsule Pt. to take 100 mg BID. Dx code Z94.0, TP date: 7.11.08. Discharge date: 7.18.08. Had part a at time of TP   cycloSPORINE modified (GENGRAF) 25 MG capsule Pt. To take 3 capsules (75 mg) BID. DX code Z94.0, TP date:7.11.08 discharge date:7.18.08. Had part A at time of TP.   docusate sodium (COLACE) 100 MG capsule 100 mg   enalapril (VASOTEC) 10 MG tablet 20 mg, Oral, 2 times a day (standard), Frequency:BID   Dosage:20   MG  Instructions:  Note:Dose: 20MG    furosemide (LASIX) 20 MG tablet TAKE 1 TABLET BY MOUTH DAILY AS NEEDED   hydrALAZINE (APRESOLINE) 25 MG tablet 75 mg, Oral, 3 times a day (standard)   hydroCHLOROthiazide (HYDRODIURIL) 50 MG tablet 50 mg, Oral, Daily (standard)   lancets Misc (E11.39); NPI: [id number]. Check blood sugar 3 times daily before meals   MEDICAL SUPPLY ITEM (E11.39); NPI: [id number]. Check blood sugar 3 times daily before meals   metoprolol tartrate (LOPRESSOR) 50 MG tablet TAKE 1 TABLET BY MOUTH TWICE A DAY   mycophenolate (CELLCEPT) 250 mg capsule 500 mg, Oral, 2 times a day (standard), diagnosis code: Z94.0   nitrofurantoin (MACRODANTIN) 100 MG capsule 100 mg, Oral, 2 times a day   sodium bicarbonate 650 mg tablet 650 mg, Oral, 3 times a day (standard)   sodium polystyrene sulfonate (KIONEX, WITH SORBITOL,) 15 gram/60 mL Susp 15 g, Oral, Daily PRN   traMADol (ULTRAM) 50 mg tablet 50 mg, Oral, Every 6 hours PRN   VENTOLIN HFA 90 mcg/actuation inhaler INHALE 2 PUFFS BY MOUTH 3 TIMES A DAY AND EVERY 2 TO 4 HOURSS AS NEEDED       Medical History:  Past Medical History: Diagnosis Date   ??? Cataract    ??? Diabetes mellitus (CMS-HCC)    ??? Diabetic retinopathy (CMS-HCC)    ??? GERD (gastroesophageal reflux disease)    ??? Hypercholesteremia    ??? Hypertension        Surgical History:  Past Surgical History:   Procedure Laterality Date   ??? CHOLECYSTECTOMY     ??? NEPHRECTOMY TRANSPLANTED ORGAN     ??? PR COLONOSCOPY FLX DX W/COLLJ SPEC WHEN PFRMD N/A 10/14/2014    Procedure: COLONOSCOPY, FLEXIBLE, PROXIMAL TO SPLENIC FLEXURE; DIAGNOSTIC, W/WO COLLECTION SPECIMEN BY BRUSH OR WASH;  Surgeon: Donneta Romberg, MD;  Location: [facility];  Service: Gastroenterology   ??? PR EYE  SURG POST SGMT PROC UNLISTED Left 06/01/10    PPV/EL/MP   ??? PR REMV CATARACT EXTRACAP,INSERT LENS Left 12/13/2012    Procedure: EXTRACAPSULAR CATARACT REMOVAL W/INSERTION OF INTRAOCULAR LENS PROSTHESIS, MANUAL OR MECHANICAL TECHNIQUE OS ZCB00 22.5 and ZA9003 22.0 and 21.5; 0.7 mm phaco with gold sleeve, 1.1 mm slit knife, 2.2 mm keratome;  Surgeon: Nadine Counts, MD;  Location: [facility];  Service: Ophthalmology   ??? PR TRABECULOPLASTY BY LASER SURGERY Bilateral     PRP       Social History:  Social History     Social History   ??? Marital status: Single     Spouse name: N/A   ??? Number of children: N/A   ??? Years of education: N/A     Occupational History   ??? Not on file.     Social History Main Topics   ??? Smoking status: Never Smoker   ??? Smokeless tobacco: Never Used   ??? Alcohol use No   ??? Drug use: No   ??? Sexual activity: Not on file     Other Topics Concern   ??? Not on file     Social History Narrative   ??? No narrative on file       Family History:  Family History   Problem Relation Age of Onset   ??? Diabetes Mother    ??? Diabetes Father    ??? Heart disease Father    ??? Glaucoma Neg Hx    ??? Retinal detachment Neg Hx    ??? Strabismus Neg Hx    ??? Macular degeneration Neg Hx    ??? Amblyopia Neg Hx    ??? Blindness Neg Hx        Review of Systems:  10 systems reviewed and are negative unless otherwise mentioned in HPI Labs/Studies:  Labs and Studies from the last 24hrs per EMR and Reviewed    Physical Exam:  Temp:  [36.7 ??C-37.3 ??C] 36.7 ??C  Heart Rate:  [77] 77  SpO2 Pulse:  [89] 89  Resp:  [18] 18  BP: (154-176)/(63-73) 176/73  SpO2:  [100 %] 100 %    Gen: Elderly male, NAD, lying in bed  Eyes: Non-icteric, PERRL, EOMI  ENT:Dry mucous membranes, neck supple, no masses  CV: RRR, no m/r/g  Pulm: CTAB, no wheezes, rhonchi or stridor  Abd: +BS, soft, minimally TTP in RLQ w/o rebound or guarding, no CVA tenderness  Ext: Warm, well perfused, non-pitting edema R>L lower extremities (baseline)  Neuro: normal strength and sensation in upper and lower extremities, no focal deficits  Psych: appropriate mood and affect

## 2017-07-31 NOTE — Unmapped (Signed)
Family at bedside.patient sitting up in bed watching tv

## 2017-07-31 NOTE — Unmapped (Signed)
Patient transported to X-ray and Ultrasound  Transported by Radiology  How tranported Wheelchair  Cardiac Monitor no

## 2017-07-31 NOTE — Unmapped (Signed)
Family at bedside. 

## 2017-07-31 NOTE — Unmapped (Signed)
Pt presents with generalized weakness, right sided flank pain,decreased PO intake, and red tinged urine for 7+ days. Pt diagnosed and treated for UTI last week. Hx of kidney transplant. VSS. AOx4

## 2017-07-31 NOTE — Unmapped (Signed)
Care Management  Initial Transition Planning Assessment  Patient has been identified as a Runner, broadcasting/film/video.Longitudinal Plan of Care reviewed, the following information is noted for continuity of care: PCP is Dr. Ree Kida, Lynden Ang of [facility]. Discussed plan of care with Dr. Sherrie Mustache. Patient will be admitted due to weakness work-up.              General  Care Manager assessed the patient by : In person interview with patient, Medical record review, Discussion with Clinical Care team  Orientation Level: Oriented X4  Who provides care at home?: N/A    Contact/Decision Maker:    Contact Details  Contact Details: Primary Contact  Primary Contact Name: Marlise Eves  Primary Contact Relationship: Significant Other  Phone #1: [phone]    Advance Directive (Medical Treatment)  Does patient have an advance directive covering medical treatment?: Patient does not have advance directive covering medical treatment., Patient would not like information.  Reason patient does not have an advance directive covering medical treatment:: Patient does not wish to complete one at this time  Information provided on advance directive:: Yes  Patient requests assistance:: No    Advance Directive (Mental Health Treatment)  Does patient have an advance directive covering mental health treatment?: Patient does not have advance directive covering mental health treatment., Patient would not like information.  Reason patient does not have an advance directive covering mental health treatment:: Patient does not wish to complete one at this time.    Patient Information:    Lives with: Spouse/significant other    Type of Residence: Private residence    Type of Residence: Mailing Address:  [address]  [place] [address]  Contacts: Contact Details: Primary Contact  Primary Contact Name: Marlise Eves  Primary Contact Relationship: Significant Other  Phone #1: [phone]  Patient Phone Number: [phone] (home)           Medical Provider(s): Braulio Bosch, MD  Reason for Admission: Admitting Diagnosis:  A 62 year old presenting to the ED for 2 weeks of generalized weakness and decreased PO intake. Differential includes infectious process including UTI, influenza, pyelonephritis, or partially treated UTI, viral syndrome including HSV viremia, and PNA    Past Medical History:   has a past medical history of Cataract; Diabetes mellitus (CMS-HCC); Diabetic retinopathy (CMS-HCC); GERD (gastroesophageal reflux disease); Hypercholesteremia; and Hypertension.  Past Surgical History:   has a past surgical history that includes pr trabeculoplasty by laser surgery (Bilateral); Nephrectomy transplanted organ; Cholecystectomy; pr eye surg post sgmt proc unlisted (Left, 06/01/10); pr remv cataract extracap,insert lens (Left, 12/13/2012); and pr colonoscopy flx dx w/collj spec when pfrmd (N/A, 10/14/2014).   Previous admit date: N/A    Primary Insurance- Payor: MEDICARE / Plan: MEDICARE PART A AND PART B / Product Type: *No Product type* /   Secondary Insurance ??? None  Prescription Coverage ??? MEDICAID  Preferred Pharmacy - [id number] [place], [place] - [address]  [facility] - [place], [place] - [address]    Transportation home: Private vehicle  Level of function prior to admission: Independent     Location/Detail: [address], [place], [place] [address] Single family house     Responsibilities/Dependents at home?: No    Home Care services in place prior to admission?: No      Outpatient/Community Resources in place prior to admission: Clinic  Agency detail (Name/Phone #): Dr. Ree Kida, Lynden Ang of [facility].  [phone]    Equipment Currently Used at Home: other (see comments)  Current HME Agency (Name/Phone #): N/A    Currently receiving outpatient dialysis?: No       Financial Information:     Patient source of income: SSDI    Need for financial assistance?: No Discharge Needs Assessment:    Concerns to be Addressed: no discharge needs identified    Clinical Risk Factors: Functional Limitations    Barriers to taking medications: No    Prior overnight hospital stay or ED visit in last 90 days: No    Readmission Within the Last 30 Days: no previous admission in last 30 days     Anticipated Changes Related to Illness: none    Equipment Needed After Discharge: other (see comments) (TBD)    Discharge Facility/Level of Care Needs: other (see comments)    Patient at risk for readmission?: Yes    Discharge Plan:    Screen findings are: Care Manager reviewed the plan of the patient's care with the Multidisciplinary Team. No discharge planning needs identified at this time. Care Manager will continue to manage plan and monitor patient's progress with the team.    Expected Discharge Date:  TBD    Expected Transfer from Critical Care:      Patient and/or family were provided with choice of facilities / services that are available and appropriate to meet post hospital care needs?: No       Initial Assessment complete?: Yes   Home Safety Screening  Patient Questions:    1.  Before the illness or injury that brought you to the ED, did you need someone to help you on a regular basis? Do you need people to help you with activities of daily living?  ?? No  2.  In the last 24 hours, have you needed more help than usual?  ?? Yes - due to weakness and difficulty performing ADL's  3.  Have you been hospitalized for one or more nights during the past six months?   ?? No  4.  Have you been to an emergency room one or more times in the past six months?  ?? No  5.  In general, do you have problems with your vision that cannot be corrected with glasses/contacts?  ?? No  6.  In general, do you have problems with your memory?  ?? No  7.  Do you take six or more different medications every day?   ?? Yes - more than 6 medications  8. Do you have any trouble getting, taking, keeping up with or paying for your medications?   ?? No  9   Have you had any falls over the past few weeks or are you concerned about falling?  ?? No  10.  Are you having trouble accessing and/or preparing the meals you need?  ?? No  11. Do you have wounds or bed ulcers?  ?? No  12  Do you have access to transportation?    Yes - arrange with family    Care Manager Questions:  Is the patient over the age of 18?  ?? No  *Home Safety Evaluation endorsed  3 of the screening items and   No intervention required at this time    When patient/family asked, Were the services I provided/discussed with you today of value?, patient/family responded Y/N? Yes - Someone checking in on me.

## 2017-08-01 LAB — BASIC METABOLIC PANEL
BLOOD UREA NITROGEN: 50 mg/dL — ABNORMAL HIGH (ref 7–21)
BUN / CREAT RATIO: 22
CHLORIDE: 98 mmol/L (ref 98–107)
CO2: 22 mmol/L (ref 22.0–30.0)
CREATININE: 2.31 mg/dL — ABNORMAL HIGH (ref 0.70–1.30)
EGFR MDRD AF AMER: 35 mL/min/{1.73_m2} — ABNORMAL LOW (ref >=60)
EGFR MDRD NON AF AMER: 29 mL/min/{1.73_m2} — ABNORMAL LOW (ref >=60)
GLUCOSE RANDOM: 219 mg/dL — ABNORMAL HIGH (ref 65–179)
POTASSIUM: 4.2 mmol/L (ref 3.5–5.0)
SODIUM: 130 mmol/L — ABNORMAL LOW (ref 135–145)

## 2017-08-01 LAB — CYCLOSPORINE (FPIA) BLOOD: Lab: 97

## 2017-08-01 LAB — GLUCOSE RANDOM: Glucose:MCnc:Pt:Ser/Plas:Qn:: 219 — ABNORMAL HIGH

## 2017-08-01 NOTE — Unmapped (Signed)
Order was placed for PIV by Venous Access Team (VAT).  Patient was assessed for placement of a PIV. Access was obtained. Blood return noted.  Dressing intact and device well secured.  Flushed with normal saline.  Pt advised to inform RN of any s/s of discomfort at the PIV site.    Workup / Procedure Time:  30 minutes      The primary RN was notified.       Thank you,     Madelyn Brunner RN Venous Access Team

## 2017-08-01 NOTE — Unmapped (Signed)
Daily Progress Note    Interval History:        Assessment/Plan:    Principal Problem:    UTI (urinary tract infection)  Active Problems:    Essential hypertension    Kidney replaced by transplant    Influenza  Resolved Problems:    * No resolved hospital problems. *        Pressure Ulcer(s)    Active Pressure Ulcer     None                 Andrew Diaz is a 62 y.o. 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  ??  UTI in Immunocompromised Patient  Previous UCx w/ Klebsiella (R: Augmentin/Cefazolin) treated with Macrobid x 6 days with continued evidence of UTI on repeat UA. I have a low suspicion for true upper pole infection but given he is overall well appearing and his lack of CVA tenderness.  Lactate normal at 1.0.  Nonseptic. Given his transplant and immunocompromised state will need to treat as complicated UTI. UCx w/ < 10K CFU's.  - S/p Ceftriaxone x 1 and transition to Cefdinir 300 mg qd for 9 days  - F/up BCx  - Patient will d/c 4/2 pending any new events  ??  Influenza  Influenza A positive.  Breathing comfortably on room air.  Based on symptom onset he is outside of the Tamiflu window but given immunocompromise state could benefit from this medication for prevention of bacterial superinfection.  ???Tamiflu 30 mg BID x5 day  - Scheduled nebs QID, guaifenesin  ??  AKI on Renal Transplant d/t Diabetic Nephropathy  Patient of Dr. Margaretmary Bayley.  Transplant in 2008.  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 Mild elevation in creatinine to 2.65 w/ Na+ 130 on admission likely hypovolemic hyponatremia d/t decreased PO. Improved to 2.3 after IVF. Cyclosporin level drawn late and will be inaccurate.  - CTN Cyclosporine 175 mg BID and Cellcept 500 mg BID    DMT  Last A1c was 6.1 in 04/2017. No anti-glycemic medications.  - Hold on SSI for now  ??  HTN  Moderate HTN on admission. Holding home Lasix, HCTZ, ACE given dehydration.  - CTN Hydral and Metoprolol    Global  - FEN/GI: General diet and replete PRN  - PPx: SCD's  - Code Status: Full  - Access: PIV  - Dispo: Floor  ___________________________________________________________________    Subjective:  No acute events overnight. Patient feeling significantly better than admission. Was febrile to 38.8 x 1 with other VSS. Still with non-productive cough. No pain at transplant site.    Labs/Studies:  Labs and Studies from the last 24hrs per EMR and Reviewed    Objective:  Temp:  [36.7 ??C-38.8 ??C] 37.2 ??C  Heart Rate:  [73-97] 73  Resp:  [18-20] 18  BP: (129-185)/(53-74) 152/60  SpO2:  [96 %-100 %] 99 %,     Gen: NAD, lying in bed  Eyes: Non-icteric, PERRL, EOMI  ENT: , Ulcer on right corner of lip, MMM, neck supple, no masses  CV: RRR, systolic ejection murmur - faint  Pulm: Inspiratory wheezes in all lung fields  Abd: +BS, soft, NDNT, no CVA tenderness  Ext: Warm, well perfused, no edema  Neuro: normal strength and sensation in upper and lower extremities, no focal deficits  Psych: appropriate mood and affect

## 2017-08-01 NOTE — Unmapped (Signed)
Patient rounding complete, call bell in reach, bed locked and in lowest position, patient belongings at bedside and within reach of patient.  Patient updated on plan of care.

## 2017-08-01 NOTE — Unmapped (Signed)
Problem: Patient Care Overview  Goal: Plan of Care Review  Outcome: Progressing  Patient admitted to 3216 with fatigue, weakness, fever and positive for Flu as well as UTI. Patient placed on Fall precautions due to weakness and has been compliant. Patient voiding hazy yellow urine. Denies dysuria. Appetite poor. Patient ate a small amount of supper and later had some applesauce. IVF NS infusing ar 150/hour for 10 hours per order. Patient initially had a temp of 38.8. Tylenol given with effectiveness. Patient states he is starting to feel better. Droplet precautions in effect as well as Save Left arm.   Goal: Individualization and Mutuality  Outcome: Progressing    Goal: Discharge Needs Assessment  Outcome: Progressing   08/01/17 0337   Discharge Needs Assessment   Concerns to be Addressed no discharge needs identified   Readmission Within the Last 30 Days no previous admission in last 30 days   Patient/Family Anticipates Transition to home with family   Patient/Family Anticipated Services at Transition none   Transportation Anticipated family or friend will provide   Anticipated Changes Related to Illness none   Equipment Needed After Discharge none   OTHER   Equipment Currently Used at Home none       Problem: Fall Risk (Adult)  Goal: Identify Related Risk Factors and Signs and Symptoms  Related risk factors and signs and symptoms are identified upon initiation of Human Response Clinical Practice Guideline (CPG).   Outcome: Progressing    Goal: Absence of Fall  Patient will demonstrate the desired outcomes by discharge/transition of care.   Outcome: Progressing   08/01/17 0337   Fall Risk (Adult)   Absence of Fall making progress toward outcome       Problem: Infection, Risk/Actual (Adult)  Goal: Identify Related Risk Factors and Signs and Symptoms  Related risk factors and signs and symptoms are identified upon initiation of Human Response Clinical Practice Guideline (CPG).   Outcome: Progressing    Goal: Infection Prevention/Resolution  Patient will demonstrate the desired outcomes by discharge/transition of care.   Outcome: Progressing   08/01/17 0337   Infection, Risk/Actual (Adult)   Infection Prevention/Resolution making progress toward outcome

## 2017-08-01 NOTE — Unmapped (Signed)
PHYSICAL THERAPY  Evaluation (08/01/17 1151)     Patient Name:  Andrew Diaz       Medical Record Number: [id number]   Date of Birth: [birth year]  Sex: Male            Treatment Diagnosis: weakness    ASSESSMENT    62 y.o. 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 He is independent for all basic mobility. Based on the AM-PAC 5 item raw score of 20/20, the patient is considered to be 0% impaired with basic mobility. Currently he has no PT needs    Today's Interventions: eval, am-pac 20/20 gait and transfer training, ther ex sit <> stands, standing balance ex, educated on importance of ther ex and OOB.    Activity Tolerance: Patient tolerated treatment well    PLAN  Planned Frequency of Treatment:  D/C Services for: D/C Services      Planned Interventions:      Post-Discharge Physical Therapy Recommendations:  PT services not indicated              Goals:   Patient and Family Goals: Get stronger.            SHORT GOAL #1: Independent for all transfers. Met day 1.                 SHORT GOAL #2: ambulate independently for 150'. Met day 1.                 SHORT GOAL #3: Independent with HEP. Met day 1.                                                       Prognosis:  Excellent     Positive Indicators: age, motivtion, PLOF, family support    SUBJECTIVE  Patient reports: I'm feeling better.  Current Functional Status: Independent     Prior functional status: Independent community ambulator       Past Medical History:   Diagnosis Date   ??? Cataract    ??? Diabetes mellitus (CMS-HCC)    ??? Diabetic retinopathy (CMS-HCC)    ??? GERD (gastroesophageal reflux disease)    ??? Hypercholesteremia    ??? Hypertension     Social History   Substance Use Topics   ??? Smoking status: Never Smoker   ??? Smokeless tobacco: Never Used   ??? Alcohol use No      Past Surgical History:   Procedure Laterality Date   ??? CHOLECYSTECTOMY     ??? NEPHRECTOMY TRANSPLANTED ORGAN     ??? PR COLONOSCOPY FLX DX W/COLLJ SPEC WHEN PFRMD N/A 10/14/2014    Procedure: COLONOSCOPY, FLEXIBLE, PROXIMAL TO SPLENIC FLEXURE; DIAGNOSTIC, W/WO COLLECTION SPECIMEN BY BRUSH OR WASH;  Surgeon: Donneta Romberg, MD;  [facility];  Service: Gastroenterology   ??? PR EYE SURG POST SGMT PROC UNLISTED Left 06/01/10    PPV/EL/MP   ??? PR REMV CATARACT EXTRACAP,INSERT LENS Left 12/13/2012    Procedure: EXTRACAPSULAR CATARACT REMOVAL W/INSERTION OF INTRAOCULAR LENS PROSTHESIS, MANUAL OR MECHANICAL TECHNIQUE OS ZCB00 22.5 and ZA9003 22.0 and 21.5; 0.7 mm phaco with gold sleeve, 1.1 mm slit knife,  2.2 mm keratome;  Surgeon: Nadine Counts, MD;  [facility]: [facility];  Service: Ophthalmology   ??? PR TRABECULOPLASTY BY LASER SURGERY Bilateral     PRP    Family History   Problem Relation Age of Onset   ??? Diabetes Mother    ??? Diabetes Father    ??? Heart disease Father    ??? Glaucoma Neg Hx    ??? Retinal detachment Neg Hx    ??? Strabismus Neg Hx    ??? Macular degeneration Neg Hx    ??? Amblyopia Neg Hx    ??? Blindness Neg Hx         Allergies: Pollen extracts                Objective Findings              Precautions: Non-applicable              Weight Bearing Status: Non- applicable              Required Braces or Orthoses: Non- applicable    Communication Preference: Verbal;Visual  Pain Comments: denies pain  Medical Tests / Procedures: reviewed in epic       At Rest: VSS  With Activity: NAD  Orthostatics: no s/sx  Airway Clearance: mobilization    Living environment: House  Lives With: Spouse  Home Living: One level home             Cognition: A,O x 4          UE ROM: wfls  UE Strength: wfls  LE ROM: wfls  LE Strength: wfls                          Balance: independent for standing activities         Bed Mobility: independent  Transfers: independent for sup <> sit <> stand   Gait: ambulated 200' independently           Endurance: good    Eval Duration(PT): 35 Min.    Medical Staff Made Aware: RN     I attest that I have reviewed the above information.  Signed: Daiva Huge, PT  Filed 08/01/2017

## 2017-08-02 MED ORDER — OSELTAMIVIR 30 MG CAPSULE
ORAL_CAPSULE | Freq: Two times a day (BID) | ORAL | 0 refills | 0 days | Status: CP
Start: 2017-08-02 — End: 2017-08-05

## 2017-08-02 MED ORDER — ACYCLOVIR 5 % TOPICAL OINTMENT
0 refills | 0 days | Status: CP
Start: 2017-08-02 — End: 2017-10-18

## 2017-08-02 NOTE — Unmapped (Addendum)
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  ??  UTI in Immunocompromised Patient  Previous UCx w/ Klebsiella (R: Augmentin/Cefazolin) treated with Macrobid x 6 days with continued evidence of UTI on repeat UA. I have a low suspicion for true upper pole infection but given he is overall well appearing and his lack of CVA tenderness. ??Lactate normal at 1.0. ??Nonseptic. Given his transplant and immunocompromised state will need to treat as complicated UTI. UCx w/ < 10K CFU's.  - S/p Ceftriaxone x 1 and transition to Cefdinir 300 mg qd for 9 days  ??  Influenza  Influenza A positive. ??Breathing comfortably on room air. ??Based on symptom onset he is outside of the Tamiflu window but given immunocompromise state could benefit from this medication for prevention of bacterial superinfection.  ???Tamiflu 30 mg BID x5 day  ??  AKI on Renal Transplant d/t Diabetic Nephropathy  Patient of Dr. Margaretmary Bayley. ??Transplant in 2008. ??Early posttransplant BK virus nephropathy. History??of a class I donor specific antibodies, but no history of rejection, significant proteinuria, and stable serum creatinine in the range of approximately 1.6-2.4 mg/dL. Mild elevation in creatinine to 2.65 w/ Na+ 130 on admission likely hypovolemic hyponatremia d/t decreased PO. Improved to 2.3 after IVF.  - CTN Cyclosporine 175 mg BID and Cellcept 500 mg BID  ??  DMT  Last A1c was 6.1 in 04/2017. No anti-glycemic medications or insulin given.  ??  HTN  Moderate HTN on admission. Held home Lasix, HCTZ, ACE given dehydration.  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  - Script provided for acyclovir ointment

## 2017-08-02 NOTE — Unmapped (Signed)
Internal Medicine Hospital Follow-Up Visit    ASSESSMENT / PLAN:  Things to follow-up at next visit:   1. Repeat electrolytes, particularly potassium and magnesium  2. Recheck BP and calibrate patient's own cuff, consider increasing hydralazine or restarting lasix   3. F/up constipation/bowel movements    1. Hypomagnesemia    2. Hospital discharge follow-up    3. Kidney replaced by transplant    4. Immunosuppressed status (CMS-HCC)    5. Type 2 diabetes mellitus with stage 3 chronic kidney disease, without long-term current use of insulin (CMS-HCC)    6. Essential hypertension    7. Acute cystitis with hematuria    8. Influenza    9. Hyperkalemia      #Kidney replaced by transplant, Immunosuppressed status (CMS-HCC)  Repeat creatinine returned to baseline. Following closely with neprology.  - Continue cyclosporine and mycophenylate  - F/up with nephrology as scheduled    #Type 2 diabetes mellitus with stage 3 chronic kidney disease, without long-term current use of insulin (CMS-HCC)  Last hemoglobin A1C at goal, checking every 6 months. Not currently on any medications.  - Continue to monitor    #Essential hypertension, Hypomagnesemia,  Hyperkalemia  BP elevated above goal. Also has hyperkalemia (this has been an chronic issue). We are currently holding lasix, but on exam (and history) he does not have evidence of hypervolemia and actually reports decreased oral intake. I am hesitant to diurese in this setting and would recommend holding off on restarting lasix for now. Attempted to Rx Kayexalate, unfortunately this is on a Sport and exercise psychologist.  - Continue hydralazine 25mg  TID, metoprolol 50mg  BID, HCTZ 50mg  daily. Enalapril 20mg  BID. Cannot increasse beta blocker due to HR constraints, if BP still elevated at f/up consider restarting lasix vs increasing enalapril  - magnesium oxide (MAGOX) 400 mg (241.3 mg magnesium) tablet; Take 1 tablet (400 mg total) by mouth Two (2) times a day.  Dispense: 60 tablet; Refill: 3 - Rx for kayexalate called in, if he cannot check, no med changes (this is an ongoing, chronic issue), recheck in 1 week at f/up visit and adjust meds at this time. Will contact nephrologist as well.     #Acute cystitis with hematuria  Resolved.   - Complete antibiotics as prescribed.    #Influenza  Resolved.  - Status post tamiflu    #Constipation  - miralax 17g in a glass of water daily    Medication adjustments:   ?? Restart Kayexalate [facility]) for high potassium - Take 1 dose today and 1 more tomorrow if no bowel movement  ?? Start magnesium - 1 pill in the AM and 1 pill in the PM    Patient provided with an updated and reconciled medications list.    Care Management and Support  Identified treatment goals: Reduce preventable readmission    Patient-stated health goal: N/A    Patient identified the following potential barriers to meeting these goals: None    To assist patient in meeting their identified goals, I provided him/her with information on the following: N/A    Patient understands diagnosis and treatment plan. Patient voices understanding. Plan is consistent with the patient???s preferences and goals.      Follow Up: Return in 1 week (on 08/15/2017).      Future Appointments   Date Time Provider Department Center   08/22/2017  8:30 AM Radene Gunning, MD [facility]   08/22/2017  9:00 AM Neldon Labella, RD/LDN [facility]   10/18/2017  7:45 AM LAB WALK-IN [facility]   10/18/2017  8:20 AM Randal Ewing Schlein, MD [facility]   10/18/2017 10:00 AM ACC DIAG RM 1 IDACC [place] - [facility]   10/18/2017 10:30 AM [facility] [place] RM 1 IUSACC [place] - [facility]         CHIEF [id number]: Hospital Follow-Up for Hospitalization Follow-up    Date of Hospitalization Discharge: 4/2/193    Reviewed: Discharge summary and Labs; See results in Epic  Discussed care with: Social worker and Forensic psychologist by phone or other method (Encounter type: Patient Outreach):  Patient Outreach History (Since 07/29/2017)     Annual Wellness Visit     Date Method of Outreach Associated Actions User Next Outreach    08/12/2017 11:39 AM Telephone  Inetta Fermo, MA           Solid Organ Transplant Refill Coordination     Date Method of Outreach Associated Actions User Next Outreach    08/09/2017  4:32 PM Telephone  Bhakti Leodis Binet 09/06/2017          Transition of Care     Date Method of Outreach Associated Actions User Next Outreach    08/03/2017 10:02 AM Telephone  Debbe Odea, RN                Successful contact made or 2 unsuccessful attempts within 2 business days  History obtained from: Patient    HISTORY OF PRESENT ILLNESS:  Summary of hospitalization:   Andrew Diaz is a 62 y.o. male with HTN, CKD d/t diabetic nephropathy s/p renal transplant (2008), who was recently hospitalized for influenza and UTI.     UTI in Immunocompromised Patient  Outpatient tx with macrobid with persistent sx and abnl UA. Had normal VS and no lactate but due to immunosuppression was treated for complicated UTI with Ceftriaxone x 1 dose transition to Cefdinir 300 for 10 days total duration  ??  Influenza  Influenza A positive. Respiratory status stable. Received tamiflu due to immunocompromised state  ??  AKI on Renal Transplant d/t Diabetic Nephropathy  Baseline creatinine in the 1.6-2.4 range. AKI with mild elevation to 2.6 with concurrent hyponatremia, suspected hypovolemic. Improved with IV hydration. Continued immunosuppression - Cyclosporine 175 mg BID and Cellcept 500 mg BID    HTN  BP elevated on admission. Initially held home Lasix, HCTZ, ACE due to dehydration. Continued hydralazine and Metoprolol. Restarted HCTZ and ACE at discharge. Lasix still held.  ??  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 Treated with acyclovir ointment.     Interval update:  Since hospital discharge, Andrew Diaz is feeling great, back to baseline. Completed tamiflu. Thinks he has 1 tab cefdinir left (but uses pillbox, so may have more -- Should have 3 remaining doses). BP 168/74. Taking HCTZ and ACE-I, does not think he is taking lasix (did not bring pills, only list). Using colace as needed 1 BM every 2-3 days, normal, not straining. Occasional will miss mid-day hydralazine and sodium bicarb. Provided counseling. No chest pain, no trouble breathing. No urinary symptoms. Weight has decreased since discharge. Feels like he needs to drink more water.     MEDICATIONS AND ALLERGIES:   Reviewed and updated in EPIC    Medication and allergy review was completed by the pharmacist and discussed during this visit. Please see their note within this encounter.    SOCIAL/FAMILY HISTORY:  Social History:  Reviewed in Epic    Biopsychosocial barriers  to care and advanced directives were addressed by the social worker and discussed during the visit. Please see their note within this encounter.    REVIEW OF SYSTEMS:    All other systems reviewed are negative except as noted here or in HPI.    PHYSICAL EXAM    BP 153/67 (BP Site: R Arm, BP Position: Sitting, BP Cuff Size: Large)  - Pulse 67  - Temp 36.6 ??C (97.8 ??F) (Oral)  - Ht 188 cm (6' 2)  - Wt (!) 101.1 kg (222 lb 14.4 oz)  - SpO2 100%  - BMI 28.62 kg/m??   General: Well appearing, in no acute distress.  HEENT: Normocephalic, atraumatic, mucus membranes moist.   Eyes: Anicteric, non-injected, extraocular movements grossly intact  MSK: Neck supple. RLE slightly bigger/swollen, chrnoic due to Fx (Remote)  Cardiovascular: Regular rate and rhythm, no murmurs, rubs or gallops. Loud S1, soft S2. LUE brachial fistula. Normal S1 and S2. No cyanosis. Ext: Warm and well perfused, no clubbing, no cyanosis or edema (other than RLE swelling noted above)  Pulmonary: Good air movement. Lungs Coarse bilaterally most obvious in the bases. On forced insp/exp has inspiratory wheeze. No rhonchi or rales  GI: Soft, non-tender, non-distended. Bowel sounds present   Skin: No rashes. LLE large, well healed scar.  Neuro: Alert, oriented and responding to questions appropriately.   Psych: Normal Mood, normal Affect    Labs:  Reviewed from discharge.  See Epic labs.  Radiology: Reviewed radiology studies from discharge. See in Epic.       MEDICAL/SURGICAL HISTORY:  Patient Active Problem List   Diagnosis   ??? Essential hypertension   ??? Hypercholesterolemia   ??? 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 Senile nuclear sclerosis   ??? Vitreous degeneration   ??? Vitreous hemorrhage (CMS-HCC)   ??? Kidney replaced by transplant   ??? Aftercare following organ transplant   ??? Cataract extraction status   ??? Lens replaced by other means   ??? Immunosuppressed status (CMS-HCC)   ??? Influenza   ??? UTI (urinary tract infection)

## 2017-08-02 NOTE — Unmapped (Signed)
OCCUPATIONAL THERAPY  Evaluation (08/02/17 0830)    Patient Name:  Andrew Diaz       Medical Record Number: [id number]   Date of Birth: 03-09-1956  Sex: Male            Assessment  62 y.o. 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 Pt presents to acute OT independent with ADL and functional mobility. Based on the daily activity AM-PAC raw score of 24/24, the pt is considered to be 0% impaired with self care. After review of pt's occupational profile and history, assessment of occupational performance, clinical decision making, and development of POC, pt presents as a low complexity case.         Activity Tolerance During Today's Session  Patient tolerated treatment well    Plan  Planned Frequency of Treatment:  D/C Services for: D/C Services     Post-Discharge Occupational Therapy Recommendations:  OT Post Acute Discharge Recommendations: OT services not indicated    ;    OT DME Recommendations: None    Subjective  Current Status Pt received supine in bed and left sitting EOB with all immediate needs met and RN aware  Prior Functional Status Pt reports indpendent with ADL and functional mobility PTA    Medical Tests / Procedures: reviewed  Services patient receives: OT;PT  Patient / Caregiver reports: Pt and RN agreeable to therapy    Past Medical History:   Diagnosis Date   ??? Cataract    ??? Diabetes mellitus (CMS-HCC)    ??? Diabetic retinopathy (CMS-HCC)    ??? GERD (gastroesophageal reflux disease)    ??? Hypercholesteremia    ??? Hypertension     Social History   Substance Use Topics   ??? Smoking status: Never Smoker   ??? Smokeless tobacco: Never Used   ??? Alcohol use No      Past Surgical History:   Procedure Laterality Date   ??? CHOLECYSTECTOMY     ??? NEPHRECTOMY TRANSPLANTED ORGAN     ??? PR COLONOSCOPY FLX DX W/COLLJ SPEC WHEN PFRMD N/A 10/14/2014    Procedure: COLONOSCOPY, FLEXIBLE, PROXIMAL TO SPLENIC FLEXURE; DIAGNOSTIC, W/WO COLLECTION SPECIMEN BY BRUSH OR WASH;  Surgeon: Donneta Romberg, MD;  [facility];  Service: Gastroenterology   ??? PR EYE SURG POST SGMT PROC UNLISTED Left 06/01/10    PPV/EL/MP   ??? PR REMV CATARACT EXTRACAP,INSERT LENS Left 12/13/2012    Procedure: EXTRACAPSULAR CATARACT REMOVAL W/INSERTION OF INTRAOCULAR LENS PROSTHESIS, MANUAL OR MECHANICAL TECHNIQUE OS ZCB00 22.5 and ZA9003 22.0 and 21.5; 0.7 mm phaco with gold sleeve, 1.1 mm slit knife, 2.2 mm keratome;  Surgeon: Nadine Counts, MD;  Location[facility];  Service: Ophthalmology   ??? PR TRABECULOPLASTY BY LASER SURGERY Bilateral     PRP    Family History   Problem Relation Age of Onset   ??? Diabetes Mother    ??? Diabetes Father    ??? Heart disease Father    ??? Glaucoma Neg Hx    ??? Retinal detachment Neg Hx    ??? Strabismus Neg Hx    ??? Macular degeneration Neg Hx    ??? Amblyopia Neg Hx    ??? Blindness Neg Hx         Pollen extracts     Objective Findings  Precautions / Restrictions  Isolation precautions    Weight Bearing  Non-applicable    Required Braces or Orthoses  Non-applicable    Communication Preference  Verbal    Pain  No c/o pain    Equipment / Environment  Vascular access (PIV, TLC, Port-a-cath, PICC)    Living Situation   Living environment: House   Lives With: Significant other   Home Living: One level home;Raised toilet seat with rails;Tub/shower unit;Level entry   Equipment available at home: None            Cognition   Orientation Level:  Oriented x 4   Arousal/Alertness:  Appropriate responses to stimuli   Attention Span:  Appears intact   Memory:  Appears intact   Following Commands:  Follows all commands and directions without difficulty   Safety Judgment:  Good awareness of safety precautions   Awareness of Errors:  Good awareness of safety precautions   Problem Solving:  Able to problem solve independently   Comments: Vision / Perception  Vision: No visual deficits          Hand Function  Hand Dominance: R   B grip WFL    Skin Inspection  Blister on lower lip bleeding, RN made aware    ROM / Strength/Coordination  UE ROM/ Strength/ Coordination: BUE WFL   LE ROM/ Strength/ Coordination: Defer to PT    Balance:  sitting balance=independent, standing balance=independent    Mobility/Gait/Transfers: sup-sit=independent, sit-stand=independent, functional mobility=independent    ADL:  Bathing: independent  Grooming: independent  Dressing: independent  Eating: independent  Toileting: independent       Vitals/ Orthostatics:  At Rest: NAD  With Activity: NAD  Orthostatics: NAD    Interventions Performed During Today's Session: AMPAC=24/24, role of OT, POC, safety edu, importance of OOB/maintaining daily routine while in hospital     Eval Duration (OT): 20 Min.    I attest that I have reviewed the above information.  Signed: Clayborne Artist, OT  Filed 08/02/2017

## 2017-08-02 NOTE — Unmapped (Signed)
Physician Discharge Summary    Identifying Information:   Andrew Diaz  07/21/1955  [id number]    Admit date: 07/31/2017    Discharge date: 08/02/2017     Discharge Service: Nephrology (MDB)    Discharge Attending Physician: Excell Seltzer, MD    Discharge to: Home    Discharge Diagnoses:  Principal Problem:    UTI (urinary tract infection)  Active Problems:    Essential hypertension    Kidney replaced by transplant    Influenza  Resolved Problems:    * No resolved hospital problems. *      Outpatient Provider Follow Up Issues:   1. 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  ??  UTI in Immunocompromised Patient  Previous UCx w/ Klebsiella (R: Augmentin/Cefazolin) treated with Macrobid x 6 days with continued evidence of UTI on repeat UA. I have a low suspicion for true upper pole infection but given he is overall well appearing and his lack of CVA tenderness. ??Lactate normal at 1.0. ??Nonseptic. Given his transplant and immunocompromised state will need to treat as complicated UTI. UCx w/ < 10K CFU's.  - S/p Ceftriaxone x 1 and transition to Cefdinir 300 mg qd for 9 days  ??  Influenza  Influenza A positive. ??Breathing comfortably on room air. ??Based on symptom onset he is outside of the Tamiflu window but given immunocompromise state could benefit from this medication for prevention of bacterial superinfection.  ???Tamiflu 30 mg BID x5 day  ??  AKI on Renal Transplant d/t Diabetic Nephropathy  Patient of Dr. Margaretmary Bayley. ??Transplant in 2008. ??Early posttransplant BK virus nephropathy. History??of a class I donor specific antibodies, but no history of rejection, significant proteinuria, and stable serum creatinine in the range of approximately 1.6-2.4 mg/dL. Mild elevation in creatinine to 2.65 w/ Na+ 130 on admission likely hypovolemic hyponatremia d/t decreased PO. Improved to 2.3 after IVF.  - CTN Cyclosporine 175 mg BID and Cellcept 500 mg BID  ??  DMT  Last A1c was 6.1 in 04/2017. No anti-glycemic medications or insulin given.  ??  HTN  Moderate HTN on admission. Held home Lasix, HCTZ, ACE given dehydration.  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  - Script provided for acyclovir ointment    Procedures:  No admission procedures for hospital encounter.  ______________________________________________________________________    Discharge Day Services:  BP 150/59  - Pulse 65  - Temp 36.7 ??C (Oral)  - Resp 18  - Ht 188 cm (6' 2)  - Wt (!) 103.2 kg (227 lb 8.2 oz)  - SpO2 100%  - BMI 29.21 kg/m??   Pt seen on the day of discharge and determined appropriate for discharge.    Condition at Discharge: good  ______________________________________________________________________  Discharge Medications:     Your Medication List      STOP taking these medications    furosemide 20 MG tablet  Commonly known as:  LASIX     nitrofurantoin 100 MG capsule  Commonly known as:  MACRODANTIN     sodium polystyrene sulfonate 15 gram/60  mL Susp  Commonly known as:  KIONEX (WITH SORBITOL)        START taking these medications    acyclovir 5 % ointment  Commonly known as:  ZOVIRAX  Apply thin layer to affected area     cefdinir 300 MG capsule  Commonly known as:  OMNICEF  Take 1 capsule (300 mg total) by mouth daily. for 8 days  Start taking on:  08/03/2017     oseltamivir 30 MG capsule  Commonly known as:  TAMIFLU  Take 1 capsule (30 mg total) by mouth Two (2) times a day. for 6 doses        CONTINUE taking these medications    aspirin, buffered 81 mg Tab  Take 81 mg by mouth. Frequency:QD   Dosage:81   MG  Instructions:  Note:Dose: 81MG  cholecalciferol (vitamin D3) 1,000 unit capsule  Take 2,000 Units by mouth every other day.     cycloSPORINE modified 100 MG capsule  Commonly known as:  GENGRAF  Pt. to take 100 mg BID. Dx code Z94.0, TP date: 7.11.08. Discharge date: 7.18.08. Had part a at time of TP     cycloSPORINE modified 25 MG capsule  Commonly known as:  GENGRAF  Pt. To take 3 capsules (75 mg) BID. DX code Z94.0, TP date:7.11.08 discharge date:7.18.08. Had part A at time of TP.     docusate sodium 100 MG capsule  Commonly known as:  COLACE  Take 100 mg by mouth. Frequency:BID   Dosage:100   MG  Instructions:  Note:Dose: 100MG      enalapril 10 MG tablet  Commonly known as:  VASOTEC  Take 2 tablets (20 mg total) by mouth Two (2) times a day. Frequency:BID   Dosage:20   MG  Instructions:  Note:Dose: 20MG      hydrALAZINE 25 MG tablet  Commonly known as:  APRESOLINE  TAKE 3 TABLETS (75 MG TOTAL) BY MOUTH THREE (3) TIMES A DAY.     hydroCHLOROthiazide 50 MG tablet  Commonly known as:  HYDRODIURIL  TAKE 1 TABLET (50 MG TOTAL) BY MOUTH DAILY.     lancets Misc  (E11.39); NPI: [id number]. Check blood sugar 3 times daily before meals     MEDICAL SUPPLY ITEM  (E11.39); NPI: [id number]. Check blood sugar 3 times daily before meals     metoprolol tartrate 50 MG tablet  Commonly known as:  LOPRESSOR  TAKE 1 TABLET BY MOUTH TWICE A DAY     mycophenolate 250 mg capsule  Commonly known as:  CELLCEPT  Take 2 capsules (500 mg total) by mouth Two (2) times a day. diagnosis code: Z94.0     sodium bicarbonate 650 mg tablet  TAKE 1 TABLET (650 MG TOTAL) BY MOUTH THREE (3) TIMES A DAY.     traMADol 50 mg tablet  Commonly known as:  ULTRAM  Take 1 tablet (50 mg total) by mouth every six (6) hours as needed for pain.          ______________________________________________________________________  Pending Test Results (if blank, then none):   Order Current Status    Blood Culture #1 Preliminary result    Blood Culture #2 Preliminary result          Most Recent Labs: Microbiology Results (last day)     Procedure Component Value Date/Time Date/Time    Blood Culture #1 [[id number]]  (Normal) Collected:  07/31/17 1254    Lab Status:  Preliminary result Specimen:  Blood from Peripheral Draw Updated:  08/01/17 1315  Blood Culture, Routine No Growth at 24 hours    Blood Culture #2 [[id number]]  (Normal) Collected:  07/31/17 1146    Lab Status:  Preliminary result Specimen:  Blood from Peripheral Draw Updated:  08/01/17 1215     Blood Culture, Routine No Growth at 24 hours          Lab Results   Component Value Date    WBC 9.2 07/31/2017    HGB 10.9 (L) 07/31/2017    HCT 35.5 (L) 07/31/2017    PLT 347 07/31/2017       Lab Results   Component Value Date    NA 130 (L) 08/01/2017    K 4.2 08/01/2017    CL 98 08/01/2017    CO2 22.0 08/01/2017    BUN 50 (H) 08/01/2017    CREATININE 2.31 (H) 08/01/2017    CALCIUM 8.7 08/01/2017    MG 1.3 (L) 07/31/2017    PHOS 3.7 07/31/2017       Lab Results   Component Value Date    ALKPHOS 75 07/31/2017    BILITOT 1.1 07/31/2017    BILIDIR 0.30 01/07/2015    PROT 6.8 07/31/2017    ALBUMIN 3.3 (L) 07/31/2017    ALT 24 07/31/2017    AST 28 07/31/2017    GGT 16 01/07/2015       Lab Results   Component Value Date    PT 14.6 (H) 08/18/2011    INR 1.3 08/18/2011    APTT 36.6 08/18/2011     Hospital Radiology:  US Renal Transplant W Doppler    Result Date: 07/31/2017  EXAM: US RENAL TRANSPLANT W DOPPLER DATE: 07/31/2017 10:49 AM ACCESSION: [id number] UN DICTATED: 07/31/2017 10:49 AM INTERPRETATION LOCATION: [facility] CLINICAL INDICATION: 62 years old Male with not feeling well x 1 week- COMPARISON: Renal Doppler 06/22/2016 TECHNIQUE:  Ultrasound views of the renal transplant were obtained using gray scale and color and spectral Doppler imaging. Views of the urinary bladder were obtained using gray scale imaging. FINDINGS: TRANSPLANTED KIDNEY: The renal transplant was located in the right lower quadrant. Normal size and echogenicity.  No solid masses or calculi. No perinephric collections identified. No hydronephrosis. Question mild urothelial thickening in the renal transplant pelvis. VESSELS: - Perfusion: Using power Doppler, normal perfusion was seen throughout the renal parenchyma. - Resistive indices in the renal transplant are improved from prior in the arcuate arteries and main renal artery, upper limits of normal to mildly elevated. RIs are elevated in the segmental arteries, mildly increased from prior examination. - Main renal artery/iliac artery: Patent - Main renal vein/iliac vein: Patent BLADDER: Minimally distended, limiting evaluation.     -Improved resistive indices in the arcuate arteries and main renal artery, which are upper normal limits-mildly elevated. Increased elevated segmental artery resistive indices measuring up to 1.0 in the inferior segmental artery. -Normal perfusion. -Question mild urothelial thickening in the renal transplant pelvis. Please see below for data measurements: RLQ kidney: length 13.9 cm; width 5.6 cm; height 6.3 cm Arcuate artery superior resistive indices: 0.86 (previously 1.0) Arcuate artery mid resistive indices: 0.83 (previously 1.0) Arcuate artery inferior resistive indices: 0.86 (previously 1.0) Segmental artery superior resistive indices: 0.90 (previously 0.82) Segmental artery mid resistive indices: 0.86 (previously 0.85) Segmental artery inferior resistive indices: 0.90-1.0 (previously 0.70) Main renal artery resistive indices: 0.79-0.87 (previously 0.87-1.0) Main renal vein: patent Iliac artery: Patent Iliac vein: Patent Bladder volume: 10.5 ml     Xr Chest 2 Views    Result Date: 07/31/2017  EXAM: XR CHEST 2 VIEWS DATE: 07/31/2017 11:04 AM ACCESSION: [id number] UN DICTATED: 07/31/2017 11:29 AM INTERPRETATION LOCATION: [facility] CLINICAL INDICATION: 62 years old Male with COUGH--  COMPARISON: CXR 06/22/2016. TECHNIQUE: PA and Lateral Chest Radiographs. FINDINGS: Lungs are well-inflated. No focal consolidation or pulmonary edema. No pneumothorax or sizable pleural effusion. Cardiomediastinal silhouette is within normal limits. Chronic appearing posterior left fifth through seventh rib fractures.     No acute airspace disease.      ______________________________________________________________________    Discharge Instructions:   Activity Instructions     Activity as tolerated             Diet Instructions     Discharge diet (specify)       Discharge Nutrition Therapy:  General              Follow Up instructions and Outpatient Referrals     Call MD for:       Worsening shortness of breath or difficulty breathing.    Pain with urination or pain in your belly near your transplanted kidney.         Call MD for:  persistent nausea or vomiting       Call MD for:  severe uncontrolled pain       Call MD for:  temperature >38.5 Celsius       Discharge instructions       You were admitted to the hospital and found to have the flu and an inadequately treated urinary tract infection. You were given IV antibiotics and will complete a course of oral antibiotics at discharge. You will also take a medication for the flu (Tamiflu) for a few more days.    We did not make any other changes to your medications but you should stop taking your Lasix until otherwise directed by your PCP or the physician who sees you at follow up.    Follow up:  [facility] Follow up Clinic  Time: 08/08/2017 1:40 PM    Please arrive 15 minutes early to allow for time to check in               Appointments which have been scheduled for you    Aug 08, 2017  1:40 PM EDT  (Arrive by 1:25 PM)  RETURN CARE TRANSITIONS [facility] with [facility] HOSPITAL FOLLOW-UP  [facility] INTERNAL MEDICINE [place] [facility] REGION) [address]  [place] [address]  [phone]   Oct 18, 2017  7:45 AM EDT  (Arrive by 7:30 AM)  LAB ONLY with LAB WALK-IN ACC  LAB PHLEB 1ST [facility] REGION) [address]  [place] [address]  [phone]   Oct 18, 2017  8:20 AM EDT  (Arrive by 8:05 AM)  RETURN NEPHROLOGY POST with Clayborne Artist, MD  [facility] KIDNEY TRANSPLANT ACC [address] [place] [facility] REGION) [address]  [place] [address]  [phone]   Oct 18, 2017 10:00 AM EDT  (Arrive by 9:45 AM)  XR CHEST PA AND LATERAL with [facility] DIAG RM 1  IMG DIAG X-RAY [facility] CARE CENTER [place] - ACC) [address]  [place] [address]  [phone]   Oct 18, 2017 10:30 AM EDT  (Arrive by 10:15 AM)  US RENAL TRANSPLANT W NATIVE KIDNEYS with ACC [place] RM 1  IMG ULTRASOUND [facility] CARE CENTER [place] - ACC) [address]  [place]  [place] [address]  [phone]          Length of Discharge: I spent greater than 30 mins in the discharge of this patient.

## 2017-08-02 NOTE — Unmapped (Signed)
Problem: Patient Care Overview  Goal: Plan of Care Review  Outcome: Progressing  Patient alert and oriented X4. Bed in low, locked position. Call bell and phone within reach. Wife at bedside. Patient on droplet precautions. No complaints of pain. Continent of bowel and bladder. Will continue to monitor.     Problem: Fall Risk (Adult)  Goal: Identify Related Risk Factors and Signs and Symptoms  Related risk factors and signs and symptoms are identified upon initiation of Human Response Clinical Practice Guideline (CPG).   Outcome: Progressing      Problem: Infection, Risk/Actual (Adult)  Goal: Identify Related Risk Factors and Signs and Symptoms  Related risk factors and signs and symptoms are identified upon initiation of Human Response Clinical Practice Guideline (CPG).   Outcome: Progressing

## 2017-08-02 NOTE — Unmapped (Signed)
Problem: Patient Care Overview  Goal: Plan of Care Review  Outcome: Progressing  Patient endorses feeling much improved. He is now ambulating in the room with a steady gait. Voiding without difficulty. Appetite improved. Patient is eager for discharge and is asking appropriate questions.    Problem: Fall Risk (Adult)  Goal: Identify Related Risk Factors and Signs and Symptoms  Related risk factors and signs and symptoms are identified upon initiation of Human Response Clinical Practice Guideline (CPG).   Outcome: Progressing    Goal: Absence of Fall  Patient will demonstrate the desired outcomes by discharge/transition of care.   Outcome: Progressing   08/02/17 0310   Fall Risk (Adult)   Absence of Fall making progress toward outcome       Problem: Infection, Risk/Actual (Adult)  Goal: Infection Prevention/Resolution  Patient will demonstrate the desired outcomes by discharge/transition of care.   Outcome: Progressing   08/02/17 0310   Infection, Risk/Actual (Adult)   Infection Prevention/Resolution making progress toward outcome

## 2017-08-03 MED ORDER — CEFDINIR 300 MG CAPSULE
ORAL_CAPSULE | ORAL | 0 refills | 0.00000 days | Status: CP
Start: 2017-08-03 — End: 2017-08-11

## 2017-08-05 NOTE — Unmapped (Signed)
REASON FOR VISIT: Care Management Hospital Follow-Up     PRESENTING PROBLEM:  Andrew Diaz presents to [facility] post discharge. He reports feeling better since discharge.     ASSESSMENT:  Patient lives with his girlfriend Claris Che in a house. He denies issues with home safety. Describes sufficient social support from friends and church family who check on him often. Family, social and cultural characteristics were assessed during the visit and plan.     He is not set up with home health services or personal care services at this time.    Denies use of medical equipment at this time. Denies need.    Barriers to care:  Financial Barriers- None, he receives SSDI benefits. States that he has tried to apply for Medicaid in the past but that he makes too much.  Medication Affordability- None   Transportation Barriers- Reports he is able to drive. His friends and family provide transportation for him. He describes it is sometimes difficult for him to get to clinic.   Food Insecurity- None   Phone: Confirmed in EPIC   Auditory or visual communication barrier or need: None    Mental Health/Substance Use:  Alcohol use: no  Illicit Substance use: denies  Tobacco use:  reports that he has never smoked. He has never used smokeless tobacco.  PHQ9: Scored 1, negative for SI and HI. Denies need for intervention at this time.    Advanced Directives: does not have on file.    INTERVENTION AND PLAN:  #Transportation  SW shared information with pt regarding transportation options through [place] transportation. Pt provided w/information in his AVS.    #Advance Directives  Patient was provided with advanced directive paperwork and was encouraged to contact clinic with questions about documents.    #Contact information  Pt was provided with Patient Resource list including After Hours, Same Day, Urgent Care, mental health and SW contact information.     #F/u calls  Pt informed they will receive 2 weeks of telephone f/u from a care team member following visit. Pt was encouraged to contact SW should additional needs arise.  ___________________________________________________   Emeline Gins, MSW, LCSW  Care Manager - [facility] Internal Medicine  [phone]

## 2017-08-08 ENCOUNTER — Encounter: Admit: 2017-08-08 | Discharge: 2017-08-08 | Payer: MEDICARE

## 2017-08-08 ENCOUNTER — Encounter: Admit: 2017-08-08 | Discharge: 2017-08-08 | Payer: MEDICARE | Attending: Internal Medicine | Primary: Internal Medicine

## 2017-08-08 DIAGNOSIS — E1122 Type 2 diabetes mellitus with diabetic chronic kidney disease: Secondary | ICD-10-CM

## 2017-08-08 DIAGNOSIS — J111 Influenza due to unidentified influenza virus with other respiratory manifestations: Secondary | ICD-10-CM

## 2017-08-08 DIAGNOSIS — Z940 Kidney transplant status: Secondary | ICD-10-CM

## 2017-08-08 DIAGNOSIS — Z09 Encounter for follow-up examination after completed treatment for conditions other than malignant neoplasm: Principal | ICD-10-CM

## 2017-08-08 DIAGNOSIS — D899 Disorder involving the immune mechanism, unspecified: Secondary | ICD-10-CM

## 2017-08-08 DIAGNOSIS — E875 Hyperkalemia: Secondary | ICD-10-CM

## 2017-08-08 DIAGNOSIS — I10 Essential (primary) hypertension: Secondary | ICD-10-CM

## 2017-08-08 DIAGNOSIS — N3001 Acute cystitis with hematuria: Secondary | ICD-10-CM

## 2017-08-08 DIAGNOSIS — N179 Acute kidney failure, unspecified: Principal | ICD-10-CM

## 2017-08-08 DIAGNOSIS — N183 Chronic kidney disease, stage 3 (moderate): Secondary | ICD-10-CM

## 2017-08-08 LAB — BASIC METABOLIC PANEL
ANION GAP: 11 mmol/L (ref 9–15)
BLOOD UREA NITROGEN: 32 mg/dL — ABNORMAL HIGH (ref 7–21)
BUN / CREAT RATIO: 14
CALCIUM: 9.2 mg/dL (ref 8.5–10.2)
CHLORIDE: 108 mmol/L — ABNORMAL HIGH (ref 98–107)
CO2: 19 mmol/L — ABNORMAL LOW (ref 22.0–30.0)
CREATININE: 2.22 mg/dL — ABNORMAL HIGH (ref 0.70–1.30)
EGFR MDRD AF AMER: 36 mL/min/{1.73_m2} — ABNORMAL LOW (ref >=60)
EGFR MDRD NON AF AMER: 30 mL/min/{1.73_m2} — ABNORMAL LOW (ref >=60)
GLUCOSE RANDOM: 155 mg/dL (ref 65–179)
POTASSIUM: 5.5 mmol/L — ABNORMAL HIGH (ref 3.5–5.0)

## 2017-08-08 LAB — CREATININE: Creatinine:MCnc:Pt:Ser/Plas:Qn:: 2.22 — ABNORMAL HIGH

## 2017-08-08 LAB — MAGNESIUM: Magnesium:MCnc:Pt:Ser/Plas:Qn:: 1.4 — ABNORMAL LOW

## 2017-08-08 MED ORDER — POLYETHYLENE GLYCOL 3350 17 GRAM ORAL POWDER PACKET
PACK | Freq: Every day | ORAL | 0 refills | 0.00000 days | Status: CP
Start: 2017-08-08 — End: 2017-10-18

## 2017-08-08 MED ORDER — SODIUM POLYSTYRENE SULFONATE 15 GRAM/60 ML ORAL SUSPENSION
Freq: Every day | ORAL | 2 refills | 0 days | Status: CP
Start: 2017-08-08 — End: 2017-08-10

## 2017-08-08 MED ORDER — MAGNESIUM OXIDE 400 MG (241.3 MG MAGNESIUM) TABLET
ORAL_TABLET | Freq: Two times a day (BID) | ORAL | 3 refills | 0 days | Status: CP
Start: 2017-08-08 — End: 2018-08-08

## 2017-08-08 NOTE — Unmapped (Signed)
Medicine Changes:  ?? Restart Kayexalate [facility]) for high potassium - Take 1 dose today and 1 more tomorrow if no bowel movement  ?? Start magnesium - 1 pill in the AM and 1 pill in the PM    Things to do:  ?? Bring your cuff to your next appointment  ?? Please contact [employer] transportation at [phone] to learn more about transportation options to the area.    Follow-up:   ?? With Dr Ree Kida on 08/15/17    Call or Come Back to Clinic if:   ?? You have chest pain, trouble breathing or have weakness  ?? You do not have a bowel movement after 2 doses of kayexalate (KIONEX)  ?? You have swelling in your legs      IMPORTANT NUMBERS  Care Manager  I???m having trouble with my health because of cost, my mood, trouble getting to clinic, or where I live. How can I get help?  ? Ferol Luz, MSW, LCSW, your Care Manager can help!  ? She is available Monday-Friday 8:00AM -5:00PM  ? Call [phone]     Financial Counselor: [phone]    Same Day Clinic   I'm sick today and need an appointment during office hours. Who do I call?  ?? Call [phone] or toll free [phone], ask for an appointment in the Same Day Clinic  ?? Same Day Clinic is located here in the [facility]    After Hours, Weekend, or Holidays:  It's after 5:00pm during the week or it's the weekend. How do I get medical attention?  ?? Call the [facility] 24/7 Nursing Line [phone] to get nurse advice.  ?? Go to [facility] Urgent Care walk-in clinic at [address], [address], [place], [place]; [phone]; 7 days a week from 9:00AM - 8:00PM.    How do I schedule an appointment or get a message to my doctor?  Call [phone] or toll free [phone].    How do I cancel or reschedule an appointment?  ?? Call [phone] or toll free [phone].   You may also cancel your appointment by using [facility] (patient portal).    How do I request medication refills?  Request a refill via [facility] (patient portal), call clinic at [phone] or have your pharmacy fax the request to [phone].

## 2017-08-08 NOTE — Unmapped (Signed)
REASON FOR VISIT: Hospital Follow-up Medication Management    ASSESSMENT AND PLAN:  #UTI  - sx improved  - recommended completing cefdinir course    #influenza  - sx resolved  - no additional therapy recommended at this time    #HTN  - BP above goal on enalapril 20mg  BID, HCTZ 50 mg daily  - BMP today  - recommend bring BP cuff to next visit to check technique and accuracy    #CKD s/p transplant  - cockroft-gault CrCl 47 based on last labs  - followed by transplant nephrology  - no change recommended to mycophenalate or cyclosporine    #Medication Management  - Medications prescribed or ordered upon discharge were reviewed today and reconciled with the most recent outpatient medication list.  Medication reconciliation was conducted by a prescribing practitioner, clinical pharmacist, or registered nurse.   - Reviewed the indication, dose, and frequency of each medication with patient    Recommendations and medication-related problems were discussed directly with Dr. Barnetta Chapel who will see Mr. Bloor immediately following this visit. Updated medication list and medication changes will be provided to the patient as a printed after visit summary.    HISTORY OF PRESENT ILLNESS:   Andrew Diaz is a 62 y.o. year old male with hx of renal tx in 2008, T2DM, HTN, and recent UTI who was recently hospitalized for complicated UTI and influenza, presents to the Internal Medicine clinic for hospital follow up.     Today, Mr. Teall reports feeling great. He completed the Tamiflu course and thinks he is almost done with cefdinir.    Mr. Boissonneault did not bring medication bottles, endorses missing doses of medications and uses a pillbox. More specifically, he misses the midday doses of hydralazine and sodium bicarb 2-3x per week. He took both of these today.    Patient manages medications at home.     Patient does not have to do without their medications because of cost.     DISCREPANCIES NOTED DURING MED REVIEW:  1.   Vit D 2000 unit capsule - 1 PO QOD; (not the 1000 unit tabs)    MEDICATIONS AND ALLERGIES:   Reviewed and updated in EPIC      ___________________________________________________   Jeanella Flattery, PharmD, BCPS, CPP

## 2017-08-09 NOTE — Unmapped (Signed)
ERROR

## 2017-08-09 NOTE — Unmapped (Signed)
[facility] Special[facility] Pharm[facility] Refill Coordination Note  Specialty Medication(s): Mycophenolate 250mg , Gengraf 25mg  & 100mg     [name], DOB: 03-22-1956  Phone: [phone] (home) , Alternate phone contact: N/A  Phone or address changes today?: No  All above HIPAA information was verified with patient.  Shipping Address: [address]  [place] [address]   Insurance changes? No    Completed refill call assessment today to schedule patient's medication shipment from the [facility] [phone]).      Confirmed the medication and dosage are correct and have not changed: Yes, regimen is correct and unchanged.    Confirmed patient started or stopped the following medications in the past month:  No, there are no changes reported at this time.    Are you tolerating your medication?:  Fleet reports tolerating the medication.    ADHERENCE    (Below is required for Medicare Part B or Transplant patients only - per drug):   How many tablets were dispensed last month:     Gengraf 25 mg   Quantity filled last month: 180   # of tablets left on hand: 8 DAYS    Gengraf 100 mg   Quantity filled last month: 60   # of tablets left on hand: 8 DAYS    Mycophenolate Mofetil 250 mg   Quantity filled last month: 120   # of tablets left on hand: 8 DAYS    Did you miss any doses in the past 4 weeks? No missed doses reported.    FINANCIAL/SHIPPING    Delivery Scheduled: Yes, Expected medication delivery date: 08/16/2017     The patient will receive an FSI print out for each medication shipped and additional FDA Medication Guides as required.  Patient education from [place] or Robet Leu may also be included in the shipment    [name] did not have any additional questions at this time.    Delivery address validated in FSI scheduling system: Yes, address listed in FSI is correct.    We will follow up with patient monthly for standard refill processing and delivery.      Thank you,  Tamala Fothergill   [facility] Shared [facility] Specialty Technician

## 2017-08-11 NOTE — Unmapped (Signed)
[facility] is faxing because they are needing an alternative request for Kionex and Generics are on backorder with no release date. Would you like to change to a different medication. Thank you

## 2017-08-12 NOTE — Unmapped (Signed)
Patient AWV scheduled for Andrew Diaz with 04/22

## 2017-08-12 NOTE — Unmapped (Signed)
Dr. Barnetta Chapel I rescheduled patient appointment for Monday 4/15 at 3:05 with Dr. Malachi Bonds per your request. I called patient and left him a message that he had an appt. On 4.15 and the time. I also left my name and number if he had any question or could not make the appt.Erie Noe

## 2017-08-15 ENCOUNTER — Encounter: Admit: 2017-08-15 | Discharge: 2017-08-16 | Payer: MEDICARE | Attending: Internal Medicine | Primary: Internal Medicine

## 2017-08-15 DIAGNOSIS — E875 Hyperkalemia: Principal | ICD-10-CM

## 2017-08-15 MED ORDER — FUROSEMIDE 20 MG TABLET
ORAL_TABLET | 4 refills | 0 days | Status: CP
Start: 2017-08-15 — End: 2017-11-18

## 2017-08-15 MED FILL — GENGRAF/25MG/CAPS: GENGRAF/25MG/CAPS | 30 days supply | Qty: 180 | Fill #3

## 2017-08-15 MED FILL — GENGRAF/100MG/CAPS: GENGRAF/100MG/CAPS | 30 days supply | Qty: 60 | Fill #3

## 2017-08-15 MED FILL — MYCOPHENOLATE MOFETIL/250MG/CAPS: MYCOPHENOLATE MOFETIL/250MG/CAPS | 30 days supply | Qty: 120 | Fill #2

## 2017-08-15 NOTE — Unmapped (Signed)
Pt request for RX Refill

## 2017-08-16 NOTE — Unmapped (Signed)
Thank you for coming into clinic today. We will check blood work to follow up your potassium and magnesium levels. I will call you with the results and we will likely change your blood pressure medications because your blood pressure was too high.

## 2017-08-18 NOTE — Unmapped (Signed)
The Internal Medicine Enhanced Care team was unable to reach [facility] to follow-up in regards to their recent visit with our Hospital F/U Team regarding Week 1 follow up call. A voicemail was left with a request to return our call. This is our first attempt to contact patient.

## 2017-08-22 NOTE — Unmapped (Addendum)
Called the patient to inform him the BMP sample that was drawn on April 15th to f/u his hyperkalemia was unfortunateely not sent to the lab. He agreed to try to come into clinic on Tuesay to get his blood redrawn. He says he cannot come Monday because he will be too busy. He has been takin gsome leftover kayexalate (about 15 mg every 3 days). I informed him of the risks involved with worsening hyperkalemia including but not limited to arrhythmia and sudden cardiac death. He voiced his understanding and that Tuesday is the earliest he would be able to come in.

## 2017-08-23 ENCOUNTER — Encounter: Admit: 2017-08-23 | Discharge: 2017-08-24 | Payer: MEDICARE

## 2017-08-23 DIAGNOSIS — E875 Hyperkalemia: Principal | ICD-10-CM

## 2017-08-23 LAB — BASIC METABOLIC PANEL
ANION GAP: 6 mmol/L — ABNORMAL LOW (ref 9–15)
BLOOD UREA NITROGEN: 29 mg/dL — ABNORMAL HIGH (ref 7–21)
BUN / CREAT RATIO: 15
CALCIUM: 9.5 mg/dL (ref 8.5–10.2)
CHLORIDE: 108 mmol/L — ABNORMAL HIGH (ref 98–107)
CREATININE: 2 mg/dL — ABNORMAL HIGH (ref 0.70–1.30)
EGFR MDRD AF AMER: 41 mL/min/{1.73_m2} — ABNORMAL LOW (ref >=60)
GLUCOSE RANDOM: 161 mg/dL — ABNORMAL HIGH (ref 65–99)
POTASSIUM: 5.2 mmol/L — ABNORMAL HIGH (ref 3.5–5.0)
SODIUM: 138 mmol/L (ref 135–145)

## 2017-08-23 LAB — CALCIUM: Calcium:MCnc:Pt:Ser/Plas:Qn:: 9.5

## 2017-08-23 LAB — MAGNESIUM: Magnesium:MCnc:Pt:Ser/Plas:Qn:: 1.6

## 2017-08-24 MED ORDER — AMLODIPINE 10 MG TABLET
ORAL_TABLET | Freq: Every day | ORAL | 3 refills | 0.00000 days | Status: CP
Start: 2017-08-24 — End: 2017-10-18

## 2017-08-25 NOTE — Unmapped (Signed)
Called patient to update him on potassium improved to 5.2. Discussed we will take him off enalapril and switch to amlodipine 10 mg w/ f/u in a month.

## 2017-08-28 NOTE — Unmapped (Signed)
Internal Medicine Clinic Visit    Reason for Visit:  Follow up for hyperkalemia    A/P:    Hyperkalemia  Re-check potassium and adjust anti-hypertensives as necessary based on K levels    HTN  For now continue enalapril, HCTZ, hydralazine, lasix, metoprolol and we will adjust accordingly based on BMP    Patient was seen with Dr. 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     He is feeling well overall since discharge with no acute complaints at this time. He denies chest pain, dyspnea, light-headedness or dizziness and has been taking his medications as prescribed. He has a history of hyperkalemia in the past previously treated with kayexalate.   __________________________________________________________    Problem List:  Patient Active Problem List   Diagnosis   ??? Essential hypertension   ??? Hypercholesterolemia   ??? 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 Senile nuclear sclerosis   ??? Vitreous degeneration   ??? Vitreous hemorrhage (CMS-HCC)   ??? Kidney replaced by transplant   ??? Aftercare following organ transplant   ??? Cataract extraction status   ??? Lens replaced by other means   ??? Immunosuppressed status (CMS-HCC)   ??? Influenza   ??? UTI (urinary tract infection)       Medications:  Reviewed in EPIC  __________________________________________________________    Physical Exam:   Vital Signs:  Vitals:    08/15/17 1559 08/15/17 1621 08/15/17 1720   BP: 204/88 183/81 140/80   BP Site: R Arm R Arm    BP Cuff Size: Large Large    Pulse: 67 62    SpO2: 100%     Weight: (!) 102.6 kg (226 lb 3.2 oz)     Height: 188 cm (6' 2)        Body mass index is 29.04 kg/m??.    Gen: Well appearing, NAD  CV: RRR, no murmurs Pulm: CTA bilaterally, no crackles or wheezes  GI: Soft, NTND, normal BS. No HSM.  Ext: No edema

## 2017-09-02 NOTE — Unmapped (Signed)
Addended by: Talbert Forest D on: 09/01/2017 11:02 PM     Modules accepted: Level of Service

## 2017-09-05 MED ORDER — HYDRALAZINE 25 MG TABLET
ORAL_TABLET | Freq: Three times a day (TID) | ORAL | 6 refills | 0.00000 days | Status: CP
Start: 2017-09-05 — End: 2018-04-01

## 2017-09-05 NOTE — Unmapped (Signed)
Pt request for RX Refill

## 2017-09-12 NOTE — Unmapped (Signed)
[facility] Refill Coordination Note    Specialty Medication(s) to be Shipped:   Transplant: mycophenolate mofetil 250mg , GENGRAF 100 and GENGRAF 25    Other medication(s) to be shipped:       [name], DOB: 08-29-1955  Phone: [phone] (home)   Shipping Address: [address]  [place] [address]    All above HIPAA information was verified with patient.     Completed refill call assessment today to schedule patient's medication shipment from the [facility] [phone]).       Specialty medication(s) and dose(s) confirmed: Regimen is correct and unchanged.   Changes to medications: Phu reports no changes reported at this time.  Changes to insurance: No  Questions for the pharmacist: No    The patient will receive an FSI print out for each medication shipped and additional FDA Medication Guides as required.  Patient education from [employer] or Robet Leu may also be included in the shipment.    DISEASE-SPECIFIC INFORMATION        N/A    ADHERENCE              MEDICARE PART B DOCUMENTATION     Gengraf 25mg : Patient has 24 tablets on hand.  Gengraf 100mg : Patient has 8 capsules on hand.  Mycophenolate mofetil 250mg : Patient has 16 capsules on hand.    SHIPPING     Shipping address confirmed in FSI.     Delivery Scheduled: Yes, Expected medication delivery date: [phone] via UPS or courier.     Antonietta Barcelona   [facility] Shared [facility] Specialty Technician

## 2017-09-13 MED ORDER — OMEPRAZOLE 40 MG CAPSULE,DELAYED RELEASE
ORAL_CAPSULE | 11 refills | 0 days | Status: CP
Start: 2017-09-13 — End: 2018-09-29

## 2017-09-13 MED FILL — GENGRAF/100MG/CAPS: GENGRAF/100MG/CAPS | 30 days supply | Qty: 60 | Fill #4

## 2017-09-13 MED FILL — MYCOPHENOLATE MOFETIL/250MG/CAPS: MYCOPHENOLATE MOFETIL/250MG/CAPS | 30 days supply | Qty: 120 | Fill #3

## 2017-09-13 MED FILL — GENGRAF/25MG/CAPS: GENGRAF/25MG/CAPS | 30 days supply | Qty: 180 | Fill #4

## 2017-10-10 NOTE — Unmapped (Signed)
[facility] Pharm[facility] Refill Coordination Note    Specialty Medication(s) to be Shipped:   Transplant: mycophenolate mofetil 250mg , Gengraf 25mg  and Gengraf 100mg      [name], DOB: [birth year]  Phone: [phone] (home)   Shipping Address: [address]  [place] [address]    All above HIPAA information was verified with patient.     Completed refill call assessment today to schedule patient's medication shipment from the [facility] [phone]).       Specialty medication(s) and dose(s) confirmed: Regimen is correct and unchanged.   Changes to medications: Pius reports no changes reported at this time.  Changes to insurance: No  Questions for the pharmacist: No    The patient will receive an FSI print out for each medication shipped and additional FDA Medication Guides as required.  Patient education from [employer] or Robet Leu may also be included in the shipment.    DISEASE-SPECIFIC INFORMATION        N/A    ADHERENCE     Medication Adherence    Patient reported X missed doses in the last month:  0         MEDICARE PART B DOCUMENTATION     Mycophenolate mofetil 250mg : Patient has 10 days worth of capsules on hand.  Gengraf 25mg : Patient has 10 days worth of on hand.  Gengraf 100mg : Patient has 10 days worth of on hand.    SHIPPING     Shipping address confirmed in FSI.     Delivery Scheduled: Yes, Expected medication delivery date: 10/19/2017 via UPS or courier.     Madelynne Lasker Leodis Binet   [facility] Shared [facility] Specialty Technician

## 2017-10-18 ENCOUNTER — Encounter: Admit: 2017-10-18 | Discharge: 2017-10-19 | Payer: MEDICARE

## 2017-10-18 ENCOUNTER — Ambulatory Visit: Admit: 2017-10-18 | Discharge: 2017-10-19 | Payer: MEDICARE | Attending: Nephrology | Primary: Nephrology

## 2017-10-18 DIAGNOSIS — Z940 Kidney transplant status: Principal | ICD-10-CM

## 2017-10-18 DIAGNOSIS — Z79899 Other long term (current) drug therapy: Secondary | ICD-10-CM

## 2017-10-18 LAB — CYCLOSPORINE, TROUGH: Lab: 76 — ABNORMAL LOW

## 2017-10-18 LAB — NITRITE UA: Lab: NEGATIVE

## 2017-10-18 LAB — URINALYSIS
BILIRUBIN UA: NEGATIVE
BLOOD UA: NEGATIVE
GLUCOSE UA: NEGATIVE
HYALINE CASTS: 1 /LPF (ref 0–1)
KETONES UA: NEGATIVE
NITRITE UA: NEGATIVE
PH UA: 5.5 (ref 5.0–9.0)
PROTEIN UA: 30 — AB
RBC UA: 1 /HPF (ref <=3)
SPECIFIC GRAVITY UA: 1.013 (ref 1.003–1.030)
SQUAMOUS EPITHELIAL: <1 /HPF (ref 0–5)
UROBILINOGEN UA: 0.2
WBC UA: 53 /HPF — ABNORMAL HIGH (ref <=2)

## 2017-10-18 LAB — COMPREHENSIVE METABOLIC PANEL
ALBUMIN: 3.7 g/dL (ref 3.5–5.0)
ALKALINE PHOSPHATASE: 72 U/L (ref 38–126)
ALT (SGPT): 12 U/L — ABNORMAL LOW (ref 19–72)
ANION GAP: 11 mmol/L (ref 9–15)
AST (SGOT): 12 U/L — ABNORMAL LOW (ref 19–55)
BILIRUBIN TOTAL: 0.3 mg/dL (ref 0.0–1.2)
BLOOD UREA NITROGEN: 43 mg/dL — ABNORMAL HIGH (ref 7–21)
BUN / CREAT RATIO: 20
CALCIUM: 9.7 mg/dL (ref 8.5–10.2)
CHLORIDE: 109 mmol/L — ABNORMAL HIGH (ref 98–107)
CREATININE: 2.18 mg/dL — ABNORMAL HIGH (ref 0.70–1.30)
EGFR MDRD AF AMER: 37 mL/min/{1.73_m2} — ABNORMAL LOW (ref >=60)
EGFR MDRD NON AF AMER: 31 mL/min/{1.73_m2} — ABNORMAL LOW (ref >=60)
GLUCOSE RANDOM: 144 mg/dL — ABNORMAL HIGH (ref 65–99)
POTASSIUM: 5.3 mmol/L — ABNORMAL HIGH (ref 3.5–5.0)
PROTEIN TOTAL: 7.3 g/dL (ref 6.5–8.3)
SODIUM: 140 mmol/L (ref 135–145)

## 2017-10-18 LAB — PROTEIN / CREATININE RATIO, URINE
CREATININE, URINE: 98.2 mg/dL
PROTEIN URINE: 58.8 mg/dL

## 2017-10-18 LAB — VITAMIN D, TOTAL (25OH): Lab: 29.3

## 2017-10-18 LAB — CMV DNA, QUANTITATIVE, PCR

## 2017-10-18 LAB — PARATHYROID HOMONE (PTH): CALCIUM: 9.7 mg/dL (ref 8.5–10.2)

## 2017-10-18 LAB — LIPID PANEL
CHOLESTEROL: 200 mg/dL — ABNORMAL HIGH (ref 100–199)
HDL CHOLESTEROL: 39 mg/dL — ABNORMAL LOW (ref 40–59)
NON-HDL CHOLESTEROL: 161 mg/dL
TRIGLYCERIDES: 156 mg/dL — ABNORMAL HIGH (ref 1–149)
VLDL CHOLESTEROL CAL: 31.2 mg/dL (ref 12–42)

## 2017-10-18 LAB — CREATININE, URINE: Lab: 98.2

## 2017-10-18 LAB — ALT (SGPT): Alanine aminotransferase:CCnc:Pt:Ser/Plas:Qn:: 12 — ABNORMAL LOW

## 2017-10-18 LAB — CMV COMMENT: Lab: 0

## 2017-10-18 LAB — LDL CHOLESTEROL CALCULATED: Cholesterol.in LDL:MCnc:Pt:Ser/Plas:Qn:Calculated: 130 — ABNORMAL HIGH

## 2017-10-18 LAB — PARATHYROID HORMONE INTACT: Parathyrin.intact:MCnc:Pt:Ser/Plas:Qn:: 188.4 — ABNORMAL HIGH

## 2017-10-18 MED ORDER — ENALAPRIL MALEATE 10 MG TABLET: 20 mg | tablet | Freq: Two times a day (BID) | 11 refills | 0 days | Status: AC

## 2017-10-18 MED ORDER — ENALAPRIL MALEATE 10 MG TABLET
ORAL_TABLET | Freq: Two times a day (BID) | ORAL | 11 refills | 0.00000 days | Status: CP
Start: 2017-10-18 — End: 2018-02-06

## 2017-10-18 MED FILL — GENGRAF/100MG/CAPS: GENGRAF/100MG/CAPS | 30 days supply | Qty: 60 | Fill #5

## 2017-10-18 MED FILL — MYCOPHENOLATE MOFETIL/250MG/CAPS: MYCOPHENOLATE MOFETIL/250MG/CAPS | 30 days supply | Qty: 120 | Fill #4

## 2017-10-18 MED FILL — GENGRAF/25MG/CAPS: GENGRAF/25MG/CAPS | 30 days supply | Qty: 180 | Fill #5

## 2017-10-18 NOTE — Unmapped (Addendum)
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 He also has a history of a class I donor specific antibodies, but has no history of rejection, significant proteinuria, and stable serum creatinine in the range of approximately 1.6-2.4 mg/dL.    Interval History:  Since last seen, patient reported dysuria and urinary frequency on 07/25/17 and was subsequently treated with Macrobid for UTI. He was then admitted 3/31-08/02/17 for continued UTI and influenza A. He received ceftriaxone transitioned to Cefdinir, and Tamiflu, respectively. He was also found to be hyperkalemic and hypomagnesemic, for which he followed with internal medicine post-discharge.  He was recommended to start amlodipine and stop enalapril due to hyperkalemia, but had swelling and chest discomfort with amlodipine and immediately stopped it, and then restarted the enalapril.      He presents today feeling well. No further fevers, chills, urinary symptoms.  He has been taking his immunosuppressive medications as prescribed without any issues.  He denies shortness of breath, chest pain, or palpitations.     Review of Systems     All other systems are reviewed and are negative.  A 10 systems review was completed.    Medications    Current Outpatient Medications   Medication Sig Dispense Refill   ??? aspirin, buffered 81 mg Tab Take 81 mg by mouth. Frequency:QD   Dosage:81   MG  Instructions:  Note:Dose: 81MG      ??? cholecalciferol, vitamin D3, (VITAMIN D3) 2,000 unit cap Take 2,000 Units by mouth every other day.     ??? cycloSPORINE modified (GENGRAF) 100 MG capsule Pt. to take 100 mg BID. Dx code Z94.0, TP date: 7.11.08. Discharge date: 7.18.08. Had part a at time of TP 60 capsule 11   ??? cycloSPORINE modified (GENGRAF) 25 MG capsule Pt. To take 3 capsules (75 mg) BID. DX code Z94.0, TP date:7.11.08 discharge date:7.18.08. Had part A at time of TP. 180 capsule 11   ??? docusate sodium (COLACE) 100 MG capsule Take 100 mg by mouth two (2) times a day as needed. Frequency:BID   Dosage:100   MG  Instructions:  Note:Dose: 100MG      ??? enalapril (VASOTEC) 10 MG tablet Take 2 tablets (20 mg total) by mouth Two (2) times a day. 120 tablet 11   ??? furosemide (LASIX) 20 MG tablet TAKE 1 TABLET BY MOUTH DAILY AS NEEDED 30 tablet 4   ??? hydrALAZINE (APRESOLINE) 25 MG tablet TAKE 3 TABLETS (75 MG TOTAL) BY MOUTH THREE (3) TIMES A DAY. 270 tablet 6   ??? hydroCHLOROthiazide (HYDRODIURIL) 50 MG tablet TAKE 1 TABLET (50 MG TOTAL) BY MOUTH DAILY. 90 tablet 3   ??? lancets Misc (E11.39); NPI: [id number]. Check blood sugar 3 times daily before meals 100 each 11   ??? magnesium oxide (MAGOX) 400 mg (241.3 mg magnesium) tablet Take 1 tablet (400 mg total) by mouth Two (2) times a day. 60 tablet 3   ??? MEDICAL SUPPLY ITEM (E11.39); NPI: [id number]. Check blood sugar 3 times daily before meals 1 Package 0   ??? metoprolol tartrate (LOPRESSOR) 50 MG tablet TAKE 1 TABLET BY MOUTH TWICE A DAY 180 tablet 3   ??? mycophenolate (CELLCEPT) 250 mg capsule Take 2 capsules (500 mg total) by mouth Two (2) times a day. diagnosis code:  Z94.0 120 capsule 11   ??? omeprazole (PRILOSEC) 40 MG capsule TAKE 1 CAPSULE (40 MG TOTAL) BY MOUTH DAILY. 30 capsule 11   ??? sodium bicarbonate 650 mg tablet TAKE 1 TABLET (650 MG TOTAL) BY MOUTH THREE (3) TIMES A DAY. 90 tablet 3   ??? traMADol (ULTRAM) 50 mg tablet Take 1 tablet (50 mg total) by mouth every six (6) hours as needed for pain. 120 tablet 5     No current facility-administered medications for this visit.        Physical Exam  BP 126/72 (BP Site: R Arm, BP Position: Sitting, BP Cuff Size: Medium)  - Pulse 60  - Temp 36.1 ??C (Temporal)  - Ht 188 cm (6' 2.02)  - Wt 100.7 kg (222 lb)  - BMI 28.49 kg/m??   General: Patient is a pleasant male in no apparent distress.  Eyes: Sclera anicteric.  Neck: Supple without LAD/JVD/bruits.  Lungs: Clear to auscultation bilaterally.  Cardiovascular: Grade 1/6 systolic murmur  Abdomen: Soft, notender/nondistended. Positive bowel sounds. No hepatosplenomegaly, masses or bruits appreciated.  Extremities: trace edema of lower extremities.  Skin: Without rash  Neurological: Grossly nonfocal.  Psychiatric: Mood and affect appropriate.    Laboratory Results    Recent Results (from the past 170 hour(s))   Parathyroid Hormone (PTH)    Collection Time: 10/18/17  8:23 AM   Result Value Ref Range    PTH 188.4 (H) 12.0 - 72.0 pg/mL    Calcium 9.7 8.5 - 10.2 mg/dL   Lipid panel    Collection Time: 10/18/17  8:23 AM   Result Value Ref Range    Triglycerides 156 (H) 1 - 149 mg/dL    Cholesterol 578 (H) 100 - 199 mg/dL    HDL 39 (L) 40 - 59 mg/dL    LDL Calculated 469 (H) 60 - 99 mg/dL    VLDL Cholesterol Cal 31.2 12 - 42 mg/dL    Chol/HDL Ratio 5.1 (H) <5.0    Non-HDL Cholesterol 161 mg/dL    FASTING Yes    Comprehensive Metabolic Panel    Collection Time: 10/18/17  8:23 AM   Result Value Ref Range    Sodium 140 135 - 145 mmol/L    Potassium 5.3 (H) 3.5 - 5.0 mmol/L    Chloride 109 (H) 98 - 107 mmol/L    CO2 20.0 (L) 22.0 - 30.0 mmol/L    BUN 43 (H) 7 - 21 mg/dL    Creatinine 6.29 (H) 0.70 - 1.30 mg/dL    BUN/Creatinine Ratio 20     EGFR MDRD Non Af Amer 31 (L) >=60 mL/min/1.19m2    EGFR MDRD Af Amer 37 (L) >=60 mL/min/1.16m2    Anion Gap 11 9 - 15 mmol/L    Glucose 144 (H) 65 - 99 mg/dL    Calcium 9.7 8.5 - 52.8 mg/dL    Albumin 3.7 3.5 - 5.0 g/dL    Total Protein 7.3 6.5 - 8.3 g/dL    Total Bilirubin 0.3 0.0 - 1.2 mg/dL    AST 12 (L) 19 - 55 U/L    ALT 12 (L) 19 - 72 U/L    Alkaline Phosphatase 72 38 - 126 U/L   Cyclosporine, Trough    Collection Time: 10/18/17  8:24 AM   Result Value Ref Range    Cyclosporine, Trough 76 (L) 100 - 400 ng/mL   Protein/Creatinine Ratio, Urine    Collection Time: 10/18/17  8:32 AM   Result Value Ref Range Creat U  98.2 Undefined mg/dL    Protein, Ur 16.1 Undefined mg/dL    Protein/Creatinine Ratio, Urine 0.599 Undefined   Urinalysis    Collection Time: 10/18/17  8:32 AM   Result Value Ref Range    Color, UA Straw     Clarity, UA Clear     Specific Gravity, UA 1.013 1.003 - 1.030    pH, UA 5.5 5.0 - 9.0    Leukocyte Esterase, UA Large (A) Negative    Nitrite, UA Negative Negative    Protein, UA 30 mg/dL (A) Negative    Glucose, UA Negative Negative    Ketones, UA Negative Negative    Urobilinogen, UA 0.2 mg/dL 0.2 mg/dL, 1.0 mg/dL    Bilirubin, UA Negative Negative    Blood, UA Negative Negative    RBC, UA 1 <=3 /HPF    WBC, UA 53 (H) <=2 /HPF    Squam Epithel, UA <1 0 - 5 /HPF    Bacteria, UA Moderate (A) None Seen /HPF    Hyaline Casts, UA 1 0 - 1 /LPF        Assessment and Plan    Andrew Diaz is a 61 y.o. 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 Active medical issues include:    1. 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 Renal function remains stable with today's creatinine of 2.18 mg/dL within his baseline range of 1.7-2.4 mg/dL. Though he has class I donor specific antibodies to HLA-A1 and HLA-B8, he has no history of rejection.    2. Immunosuppression management. We will continue the current immunosuppressive regimen targeting cyclosporine levels of approximately 75-125. Today's level is at goal at 76. CellCept will be continued at a dosage of 500 mg BID and cyclosporine at the current dose.    3. Hypertension, controlled. He will continue his current regimen, including enalapril with mild hyperkalemia.  Continue with HCTZ 50mg  and hydralazine 75mg  TID; continue Lasix prn.    4. Hyperlipidemia. Continue to hold Pravachol due to past myalgias with statins.     5. 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 Continue sodium bicarbonate 650 mg TID. Potassium is 5.3 today, which is at goal for this patient.  Will continue ACE-I unless consistently>6; has prn Kayexylate if needed for hyperkalemia that needs medical management.    6. Diabetes mellitus. Last hemoglobin A1c was 6.1 on 04/20/17. He will continue current therapy.     7. Cancer screens. Patient had a colonoscopy revealing no polyps in 2016. Renal US on 06/22/16 revealed no masses.     8. Immunizations. Patient has received both doses of Shingrix (10/2016, 04/2017). He is up-to-date on all vaccines.    9. Follow-up. The patient will be seen again in approximately 6 months

## 2017-10-20 LAB — EBV VIRAL LOAD RESULT: Lab: NOT DETECTED

## 2017-10-21 LAB — VITAMIN D 1,25-DIHYDROXY: 1,25-Dihydroxyvitamin D:MCnc:Pt:Ser/Plas:Qn:: 32

## 2017-10-24 MED ORDER — CIPROFLOXACIN 500 MG TABLET
ORAL_TABLET | Freq: Two times a day (BID) | ORAL | 0 refills | 0.00000 days | Status: CP
Start: 2017-10-24 — End: 2017-11-03

## 2017-10-24 NOTE — Unmapped (Signed)
Urine culture from 6/18 positive for Klebsiella.  Per Dr. Margaretmary Bayley, pt. Started on Cipro 500 mg BID x 10 days.

## 2017-10-31 LAB — FSAB CLASS 1 ANTIBODY SPECIFICITY

## 2017-10-31 LAB — HLA DS POST TRANSPLANT
ANTI-DONOR HLA-A #1 MFI: 853 MFI
ANTI-DONOR HLA-A #2 MFI: 48 MFI
ANTI-DONOR HLA-B #1 MFI: 3286 MFI — ABNORMAL HIGH
ANTI-DONOR HLA-B #2 MFI: 54 MFI
ANTI-DONOR HLA-C #1 MFI: 716 MFI
ANTI-DONOR HLA-DQB #1 MFI: 132 MFI
ANTI-DONOR HLA-DQB #2 MFI: 122 MFI
ANTI-DONOR HLA-DR #1 MFI: 109 MFI
ANTI-DONOR HLA-DR #2 MFI: 90 MFI

## 2017-10-31 LAB — HLA CL1 ANTIBODY COMM: Lab: 0

## 2017-10-31 LAB — ANTI-DONOR HLA-DR #2 MFI: Lab: 90

## 2017-10-31 LAB — HLA CL2 AB COMMENT: Lab: 0

## 2017-11-14 NOTE — Unmapped (Signed)
Pt request for RX Refill

## 2017-11-15 MED ORDER — SODIUM BICARBONATE 650 MG TABLET
ORAL_TABLET | Freq: Three times a day (TID) | ORAL | 1 refills | 0.00000 days | Status: CP
Start: 2017-11-15 — End: 2018-11-22

## 2017-11-18 MED ORDER — FUROSEMIDE 20 MG TABLET
ORAL_TABLET | 3 refills | 0 days | Status: CP
Start: 2017-11-18 — End: 2018-12-19

## 2017-11-18 NOTE — Unmapped (Signed)
[facility] Refill Coordination Note  Medication: GENGRAF 25 AND 100, MYCOPHENOLATE    Unable to reach patient to schedule shipment for medication being filled at [facility]. left message with a lady who answered the phone, and left a voicemail.  As this is the 3rd unsuccessful attempt to reach the patient, no additional phone call attempts will be made at this time.      Phone numbers attempted: [phone], [phone]  Last scheduled delivery: 10/18/17    Please call the [facility] at [phone] (option 4) should you have any further questions.      Thanks,  [facility] Shared [employer] [facility] Specialty Team

## 2017-11-18 NOTE — Unmapped (Signed)
Pt request for RX Refill

## 2017-11-21 MED FILL — GENGRAF/25MG/CAPS: GENGRAF/25MG/CAPS | 30 days supply | Qty: 180 | Fill #6

## 2017-11-21 MED FILL — MYCOPHENOLATE MOFETIL/250MG/CAPS: MYCOPHENOLATE MOFETIL/250MG/CAPS | 30 days supply | Qty: 120 | Fill #5

## 2017-11-21 MED FILL — GENGRAF/100MG/CAPS: GENGRAF/100MG/CAPS | 30 days supply | Qty: 60 | Fill #6

## 2017-11-21 NOTE — Unmapped (Signed)
[facility] Refill and Clinical Coordination Note  Medication(s): mycophenolate, gengraf 25 and [address] [place], [place]: August 29, 1955  Phone: [phone] (home) , Alternate phone contact: N/A  Shipping address: [address]  [place] [address]  Phone or address changes today?: No  All above HIPAA information verified.  Insurance changes? No    Completed refill and clinical call assessment today to schedule patient's medication shipment from the [facility] [phone]).      MEDICATION RECONCILIATION    Confirmed the medication and dosage are correct and have not changed: Yes, regimen is correct and unchanged.    Were there any changes to your medication(s) in the past month:  No, there are no changes reported at this time.    ADHERENCE    Is this medicine transplant or covered by Medicare Part B? Yes.    Mycophenolate Mofetil 250 mg   Quantity filled last month: 120   # of tablets left on hand: 1 day left  Gengraf 25 mg   Quantity filled last month: 180   # of tablets left on hand: 1 day left  Gengraf 100 mg   Quantity filled last month: 60   # of tablets left on hand: 1 day left      Did you miss any doses in the past 4 weeks? No missed doses reported.  Adherence counseling provided? Not needed     SIDE EFFECT MANAGEMENT    Are you tolerating your medication?:  Andrew Diaz reports tolerating the medication.  Side effect management discussed: None      Therapy is appropriate and should be continued.    Evidence of clinical benefit: See Epic note from 10/18/17      FINANCIAL/SHIPPING    Delivery Scheduled: Yes, Expected medication delivery date: 11/21/17 (today) via sd wfd   Additional medications refilled: No additional medications/refills needed at this time.    The patient will receive an FSI print out for each medication shipped and additional FDA Medication Guides as required.  Patient education from [employer] or Robet Leu may also be included in the shipment.    Andrew Diaz did not have any additional questions at this time.    Delivery address validated in FSI scheduling system: Yes, address listed above is correct.      We will follow up with patient monthly for standard refill processing and delivery.      Thank you,  Thad Ranger   [facility] Shared [facility] Specialty Pharmacist

## 2017-12-05 NOTE — Unmapped (Signed)
Current Outpatient Medications on File Prior to Visit   Medication Sig   ??? aspirin, buffered 81 mg Tab Take 81 mg by mouth. Frequency:QD   Dosage:81   MG  Instructions:  Note:Dose: 81MG    ??? cholecalciferol, vitamin D3, (VITAMIN D3) 2,000 unit cap Take 2,000 Units by mouth every other day.   ??? cycloSPORINE modified (GENGRAF) 100 MG capsule Pt. to take 100 mg BID. Dx code Z94.0, TP date: 7.11.08. Discharge date: 7.18.08. Had part a at time of TP   ??? cycloSPORINE modified (GENGRAF) 25 MG capsule Pt. To take 3 capsules (75 mg) BID. DX code Z94.0, TP date:7.11.08 discharge date:7.18.08. Had part A at time of TP.   ??? docusate sodium (COLACE) 100 MG capsule Take 100 mg by mouth two (2) times a day as needed. Frequency:BID   Dosage:100   MG  Instructions:  Note:Dose: 100MG    ??? enalapril (VASOTEC) 10 MG tablet Take 2 tablets (20 mg total) by mouth Two (2) times a day.   ??? furosemide (LASIX) 20 MG tablet TAKE 1 TABLET BY MOUTH DAILY AS NEEDED   ??? hydrALAZINE (APRESOLINE) 25 MG tablet TAKE 3 TABLETS (75 MG TOTAL) BY MOUTH THREE (3) TIMES A DAY.   ??? hydroCHLOROthiazide (HYDRODIURIL) 50 MG tablet TAKE 1 TABLET (50 MG TOTAL) BY MOUTH DAILY.   ??? lancets Misc (E11.39); NPI: [id number]. Check blood sugar 3 times daily before meals   ??? magnesium oxide (MAGOX) 400 mg (241.3 mg magnesium) tablet Take 1 tablet (400 mg total) by mouth Two (2) times a day.   ??? MEDICAL SUPPLY ITEM (E11.39); NPI: [id number]. Check blood sugar 3 times daily before meals   ??? metoprolol tartrate (LOPRESSOR) 50 MG tablet TAKE 1 TABLET BY MOUTH TWICE A DAY   ??? mycophenolate (CELLCEPT) 250 mg capsule Take 2 capsules (500 mg total) by mouth Two (2) times a day. diagnosis code: Z94.0   ??? omeprazole (PRILOSEC) 40 MG capsule TAKE 1 CAPSULE (40 MG TOTAL) BY MOUTH DAILY.   ??? sodium bicarbonate 650 mg tablet TAKE 1 TABLET (650 MG TOTAL) BY MOUTH THREE (3) TIMES A DAY.   ??? traMADol (ULTRAM) 50 mg tablet Take 1 tablet (50 mg total) by mouth every six (6) hours as needed for pain.     No current facility-administered medications on file prior to visit.

## 2017-12-08 MED ORDER — HYDROCHLOROTHIAZIDE 50 MG TABLET
ORAL_TABLET | Freq: Every day | ORAL | 3 refills | 0 days | Status: CP
Start: 2017-12-08 — End: 2019-01-02

## 2017-12-09 NOTE — Unmapped (Signed)
Patient contact did not originate through upfront.    Outreached to patient to schedule clinic performed DM eye, A1c.  Patient was unable to be contacted. Left Message.

## 2017-12-20 NOTE — Unmapped (Signed)
[facility] Refill Coordination Note    Specialty Medication(s) to be Shipped:   Transplant: mycophenolate mofetil 250mg , Gengraf 25mg  and Gengraf 100mg      [name], DOB: 1955/10/17  Phone: [phone] (home)   Shipping Address: [address]  [place] [address]    All above HIPAA information was verified with patient.     Completed refill call assessment today to schedule patient's medication shipment from the [facility] [phone]).       Specialty medication(s) and dose(s) confirmed: Regimen is correct and unchanged.   Changes to medications: Lynkin reports no changes reported at this time.  Changes to insurance: No  Questions for the pharmacist: No    The patient will receive a drug information handout for each medication shipped and additional FDA Medication Guides as required.      DISEASE/MEDICATION-SPECIFIC INFORMATION        N/A    ADHERENCE     Medication Adherence    Patient reported X missed doses in the last month:  0         MEDICARE PART B DOCUMENTATION     Gengraf 25mg : Patient has 5 days worth of tablets on hand.  Gengraf 100mg : Patient has 5 days worth of capsules on hand.  Mycophenolate mofetil 250mg : Patient has 5 days worth of capsules on hand.    SHIPPING     Shipping address confirmed in Epic.     Delivery Scheduled: Yes, Expected medication delivery date: 12/23/2017 via UPS or courier.     Mister Krahenbuhl Leodis Binet   [facility] Shared [facility] Specialty Technician

## 2017-12-22 MED FILL — GENGRAF 100 MG CAPSULE: 30 days supply | Qty: 60 | Fill #0 | Status: AC

## 2017-12-22 MED FILL — GENGRAF 25 MG CAPSULE: 30 days supply | Qty: 180 | Fill #0 | Status: AC

## 2017-12-22 MED FILL — GENGRAF 25 MG CAPSULE: 30 days supply | Qty: 180 | Fill #0

## 2017-12-22 MED FILL — MYCOPHENOLATE MOFETIL 250 MG CAPSULE: 30 days supply | Qty: 120 | Fill #0

## 2017-12-22 MED FILL — GENGRAF 100 MG CAPSULE: ORAL | 30 days supply | Qty: 60 | Fill #0

## 2017-12-22 MED FILL — MYCOPHENOLATE MOFETIL 250 MG CAPSULE: 30 days supply | Qty: 120 | Fill #0 | Status: AC

## 2018-01-13 NOTE — Unmapped (Signed)
[facility] Specialty Pharmacy Refill Coordination Note  Specialty Medication(s): Cellcept 250, Cyclosporine 25 and 100mg   Additional Medications shipped: none    Andrew Diaz, DOB: 1955/05/19  Phone: [phone] (home) , Alternate phone contact: N/A  Phone or address changes today?: No  All above HIPAA information was verified with patient.  Shipping Address: [address]  [place] [address]   Insurance changes? No    Completed refill call assessment today to schedule patient's medication shipment from the [facility] [phone]).      Confirmed the medication and dosage are correct and have not changed: Yes, regimen is correct and unchanged.    Confirmed patient started or stopped the following medications in the past month:  No, there are no changes reported at this time.    Are you tolerating your medication?:  Andrew Diaz reports tolerating the medication.    ADHERENCE    (Below is required for Medicare Part B or Transplant patients only - per drug):   How many tablets were dispensed last month: Cellcept: 120, cyclosporine 25: 180, 100mg -60  Patient currently has one week (7 days) remaining.    Did you miss any doses in the past 4 weeks? No missed doses reported.    FINANCIAL/SHIPPING    Delivery Scheduled: Yes, Expected medication delivery date: 01/17/2018     The patient will receive a drug information handout for each medication shipped and additional FDA Medication Guides as required.      Andrew Diaz did not have any additional questions at this time.    Delivery address validated in Epic.    We will follow up with patient monthly for standard refill processing and delivery.      Thank you,  Breck Coons Shared [facility] Specialty Pharmacist

## 2018-01-16 MED FILL — GENGRAF 100 MG CAPSULE: ORAL | 30 days supply | Qty: 60 | Fill #1

## 2018-01-16 MED FILL — MYCOPHENOLATE MOFETIL 250 MG CAPSULE: 30 days supply | Qty: 120 | Fill #1 | Status: AC

## 2018-01-16 MED FILL — GENGRAF 25 MG CAPSULE: 30 days supply | Qty: 180 | Fill #1 | Status: AC

## 2018-01-16 MED FILL — GENGRAF 25 MG CAPSULE: 30 days supply | Qty: 180 | Fill #1

## 2018-01-16 MED FILL — MYCOPHENOLATE MOFETIL 250 MG CAPSULE: 30 days supply | Qty: 120 | Fill #1

## 2018-01-16 MED FILL — GENGRAF 100 MG CAPSULE: 30 days supply | Qty: 60 | Fill #1 | Status: AC

## 2018-02-06 MED ORDER — ENALAPRIL MALEATE 10 MG TABLET
ORAL_TABLET | Freq: Two times a day (BID) | ORAL | 5 refills | 0 days | Status: CP
Start: 2018-02-06 — End: 2018-08-28

## 2018-02-06 NOTE — Unmapped (Signed)
Please eprescribe teed up medication to pharmacy on encounter.  Please let me know if I can do anything to help you.  Thank you.

## 2018-02-10 NOTE — Unmapped (Signed)
[facility] Specialty Pharmacy Refill Coordination Note    Specialty Medication(s) to be Shipped:   Transplant: mycophenolate mofetil 250mg , cyclosporine 25mg  and cyclosporine 100mg      Andrew Diaz, DOB: [birth year]  Phone: [phone] (home)   Shipping Address: [address]  [place] [address]    All above HIPAA information was verified with patient.     Completed refill call assessment today to schedule patient's medication shipment from the [facility] [phone]).       Specialty medication(s) and dose(s) confirmed: Regimen is correct and unchanged.   Changes to medications: Andrew Diaz reports no changes reported at this time.  Changes to insurance: No  Questions for the pharmacist: No    The patient will receive a drug information handout for each medication shipped and additional FDA Medication Guides as required.      DISEASE/MEDICATION-SPECIFIC INFORMATION        N/A    ADHERENCE     Medication Adherence    Patient reported X missed doses in the last month:  0  Specialty Medication:  Cyclosporine 25mg  & 100mg  & Mycopehnolate 250  Patient is on additional specialty medications:  No  Patient is on more than two specialty medications:  No  Any gaps in refill history greater than 2 weeks in the last 3 months:  no  Demonstrates understanding of importance of adherence:  yes  Informant:  patient  Reliability of informant:  fairly reliable  Adherence tools used:  patient uses a pill box to manage medications  Confirmed plan for next specialty medication refill:  delivery by pharmacy          Refill Coordination    Has the Patients' Contact Information Changed:  No  Is the Shipping Address Different:  No         MEDICARE PART B DOCUMENTATION     Cyclosporine modified 25mg : Patient has 7 days worth of capsules on hand.  Cyclosporine modified 100mg : Patient has 7 days worth of capsules on hand.  Mycophenolate mofetil 250mg : Patient has 7 days worth of capsules on hand.    SHIPPING Shipping address confirmed in Epic.     Delivery Scheduled: Yes, Expected medication delivery date: 02/15/2018 via UPS or courier.     Andrew Diaz   [facility] Shared [facility] Specialty Technician

## 2018-02-14 MED FILL — MYCOPHENOLATE MOFETIL 250 MG CAPSULE: 30 days supply | Qty: 120 | Fill #2

## 2018-02-14 MED FILL — MYCOPHENOLATE MOFETIL 250 MG CAPSULE: 30 days supply | Qty: 120 | Fill #2 | Status: AC

## 2018-02-14 MED FILL — GENGRAF 25 MG CAPSULE: 30 days supply | Qty: 180 | Fill #2

## 2018-02-14 MED FILL — GENGRAF 25 MG CAPSULE: 30 days supply | Qty: 180 | Fill #2 | Status: AC

## 2018-02-14 MED FILL — GENGRAF 100 MG CAPSULE: 30 days supply | Qty: 60 | Fill #2 | Status: AC

## 2018-02-14 MED FILL — GENGRAF 100 MG CAPSULE: ORAL | 30 days supply | Qty: 60 | Fill #2

## 2018-03-16 NOTE — Unmapped (Signed)
[facility] Specialty Pharmacy Refill Coordination Note    Specialty Medication(s) to be Shipped:   Transplant: mycophenolate mofetil 250mg , cyclosporine 25mg  and cyclosporine 100mg   ** Patient takes Gengraf 25mg  & 100mg  **     [name], DOB: February 25, 1956  Phone: [phone] (home)     All above HIPAA information was verified with patient.     Completed refill call assessment today to schedule patient's medication shipment from the [facility] [phone]).       Specialty medication(s) and dose(s) confirmed: Regimen is correct and unchanged.   Changes to medications: Breylen reports no changes reported at this time.  Changes to insurance: No  Questions for the pharmacist: No    The patient will receive a drug information handout for each medication shipped and additional FDA Medication Guides as required.      DISEASE/MEDICATION-SPECIFIC INFORMATION        N/A    ADHERENCE     Medication Adherence    Patient reported X missed doses in the last month:  0  Specialty Medication:  Mycophenolate 250mg , Gengraf 25mg  & 100mg   Patient is on additional specialty medications:  No  Patient is on more than two specialty medications:  No  Any gaps in refill history greater than 2 weeks in the last 3 months:  no  Demonstrates understanding of importance of adherence:  yes  Informant:  patient  Reliability of informant:  fairly reliable      Adherence tools used:  patient uses a pill box to manage medications      Support network for adherence:  family member      Confirmed plan for next specialty medication refill:  delivery by pharmacy          Refill Coordination    Has the Patients' Contact Information Changed:  No  Is the Shipping Address Different:  No         MEDICARE PART B DOCUMENTATION     Gengraf 25mg : Patient has 6 days worth of tablets on hand.  Gengraf 100mg : Patient has 6 days worth of capsules on hand.  Mycophenolate mofetil 250mg : Patient has 6 days worht of capsules on hand. SHIPPING     Shipping address confirmed in Epic.     Delivery Scheduled: Yes, Expected medication delivery date: 03/21/2018 via UPS or courier.     Medication will be delivered via UPS to the home address in [employer] [place].    Abiel Antrim P [name]   [facility] Shared [facility] Specialty Technician

## 2018-03-20 MED FILL — MYCOPHENOLATE MOFETIL 250 MG CAPSULE: 30 days supply | Qty: 120 | Fill #3

## 2018-03-20 MED FILL — GENGRAF 100 MG CAPSULE: ORAL | 30 days supply | Qty: 60 | Fill #3

## 2018-03-20 MED FILL — GENGRAF 25 MG CAPSULE: 30 days supply | Qty: 180 | Fill #3 | Status: AC

## 2018-03-20 MED FILL — GENGRAF 25 MG CAPSULE: 30 days supply | Qty: 180 | Fill #3

## 2018-03-20 MED FILL — GENGRAF 100 MG CAPSULE: 30 days supply | Qty: 60 | Fill #3 | Status: AC

## 2018-03-20 MED FILL — MYCOPHENOLATE MOFETIL 250 MG CAPSULE: 30 days supply | Qty: 120 | Fill #3 | Status: AC

## 2018-04-03 MED ORDER — HYDRALAZINE 25 MG TABLET
ORAL_TABLET | Freq: Three times a day (TID) | ORAL | 2 refills | 0 days | Status: CP
Start: 2018-04-03 — End: 2018-12-28

## 2018-04-03 NOTE — Unmapped (Signed)
Pt request for RX Refill

## 2018-04-04 NOTE — Unmapped (Signed)
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 He also has a history of a class I donor specific antibodies, but has no history of rejection or significant proteinuria with baseline serum creatinine 1.6-2.4 mg/dL.    Interval History:  He presents today without current complaints. He did have testicular pain recently that has now resolved. He denies dysuria, difficulty voiding, and or discharge. He does note more frequent voiding due to lasix. He was treated for a klebsiella UTI with Cipro x 10 days in 10/2017 after being hospitalized in 08/2017 with nfluenza and a UTI. Home BP's are in the 150's/70's.      Last dose of Cyclosporine was this AM (not a trough level today)    Review of Systems     All other systems are reviewed and are negative.  A 10 systems review was completed.    Medications    Current Outpatient Medications   Medication Sig Dispense Refill   ??? aspirin, buffered 81 mg Tab Take 81 mg by mouth. Frequency:QD   Dosage:81   MG  Instructions:  Note:Dose: 81MG      ??? cholecalciferol, vitamin D3, (VITAMIN D3) 2,000 unit cap Take 2,000 Units by mouth every other day.     ??? cycloSPORINE modified (NEORAL) 100 MG capsule TAKE 1 CAPSULE BY MOUTH TWICE DAILY 60 each 11   ??? cycloSPORINE modified (NEORAL) 25 MG capsule TAKE 3 CAPSULES (75MG ) BY MOUTH TWICE DAILY 180 each 11   ??? docusate sodium (COLACE) 100 MG capsule Take 100 mg by mouth two (2) times a day as needed. Frequency:BID   Dosage:100   MG  Instructions:  Note:Dose: 100MG      ??? enalapril (VASOTEC) 10 MG tablet Take 2 tablets (20 mg total) by mouth Two (2) times a day. 120 tablet 5   ??? furosemide (LASIX) 20 MG tablet TAKE 1 TABLET BY MOUTH DAILY AS NEEDED 90 tablet 3   ??? hydrALAZINE (APRESOLINE) 25 MG tablet TAKE 3 TABLETS (75 MG TOTAL) BY MOUTH THREE (3) TIMES A DAY. 810 tablet 2   ??? hydroCHLOROthiazide (HYDRODIURIL) 50 MG tablet TAKE 1 TABLET (50 MG TOTAL) BY MOUTH DAILY. 90 tablet 3   ??? lancets Misc (E11.39); NPI: [id number]. Check blood sugar 3 times daily before meals 100 each 11   ??? magnesium oxide (MAGOX) 400 mg (241.3 mg magnesium) tablet Take 1 tablet (400 mg total) by mouth Two (2) times a day. 60 tablet 3   ??? MEDICAL SUPPLY ITEM (E11.39); NPI: [id number]. Check blood sugar 3 times daily before meals 1 Package 0   ??? metoprolol tartrate (LOPRESSOR) 50 MG tablet TAKE 1 TABLET BY MOUTH TWICE A DAY 180 tablet 3   ??? mycophenolate (CELLCEPT) 250 mg capsule TAKE 2 CAPSULES (500MG ) BY MOUTH TWICE DAILY 120 capsule 11   ??? omeprazole (PRILOSEC) 40 MG capsule TAKE 1 CAPSULE (40 MG TOTAL) BY MOUTH DAILY. 30 capsule 11   ??? sodium bicarbonate 650 mg tablet TAKE 1 TABLET (650 MG TOTAL) BY MOUTH THREE (3) TIMES A DAY. 270 tablet 1   ??? sodium polystyrene, SPS, with sorbitol 15-20 gram/60 mL Susp Take 15 Gm daily as needed  For elevated potassium level. 360 mL 2   ??? traMADol (ULTRAM) 50 mg tablet Take 1 tablet (50 mg  total) by mouth every six (6) hours as needed for pain. 120 tablet 5     No current facility-administered medications for this visit.        Physical Exam  BP 144/70 (BP Site: R Arm, BP Position: Sitting, BP Cuff Size: Medium)  - Pulse 60  - Temp 36.2 ??C (97.1 ??F) (Temporal)  - Ht 188 cm (6' 2.02)  - Wt (!) 101.4 kg (223 lb 9.6 oz)  - BMI 28.70 kg/m??   General: Patient is a pleasant male in no apparent distress.  Eyes: Sclera anicteric.  Neck: Supple without LAD/JVD/bruits.  Lungs: Clear to auscultation bilaterally.  Cardiovascular: Grade 1/6 systolic murmur  Abdomen: Soft, notender/nondistended. Positive bowel sounds. No hepatosplenomegaly, masses or bruits appreciated.  Extremities: trace edema of lower extremities.  Skin: Without rash  Neurological: Grossly nonfocal.  Psychiatric: Mood and affect appropriate.    Laboratory Results    Recent Results (from the past 170 hour(s))   Comprehensive Metabolic Panel    Collection Time: 04/05/18  9:38 AM   Result Value Ref Range    Sodium 138 135 - 145 mmol/L    Potassium 5.9 (H) 3.5 - 5.0 mmol/L    Chloride 107 98 - 107 mmol/L    CO2 23.0 22.0 - 30.0 mmol/L    BUN 60 (H) 7 - 21 mg/dL    Creatinine 1.61 (H) 0.70 - 1.30 mg/dL    BUN/Creatinine Ratio 22     EGFR CKD-EPI Non-African American, Male 24 (L) >=60 mL/min/1.59m2    EGFR CKD-EPI African American, Male 27 (L) >=60 mL/min/1.10m2    Glucose 117 65 - 179 mg/dL    Calcium 9.6 8.5 - 09.6 mg/dL    Albumin 4.1 3.5 - 5.0 g/dL    Total Protein 7.9 6.5 - 8.3 g/dL    Total Bilirubin 0.7 0.0 - 1.2 mg/dL    AST 14 (L) 19 - 55 U/L    ALT 8 <50 U/L    Alkaline Phosphatase 65 38 - 126 U/L    Anion Gap 8 7 - 15 mmol/L   Phosphorus Level    Collection Time: 04/05/18  9:38 AM   Result Value Ref Range    Phosphorus 4.2 2.9 - 4.7 mg/dL   Magnesium Level    Collection Time: 04/05/18  9:38 AM   Result Value Ref Range    Magnesium 1.5 (L) 1.6 - 2.2 mg/dL   CBC w/ Differential    Collection Time: 04/05/18  9:38 AM   Result Value Ref Range    WBC 3.6 (L) 4.5 - 11.0 10*9/L    RBC 4.96 4.50 - 5.90 10*12/L    HGB 12.3 (L) 13.5 - 17.5 g/dL    HCT 04.5 (L) 40.9 - 53.0 %    MCV 81.5 80.0 - 100.0 fL    MCH 24.8 (L) 26.0 - 34.0 pg    MCHC 30.4 (L) 31.0 - 37.0 g/dL    RDW 81.1 (H) 91.4 - 15.0 %    MPV 7.8 7.0 - 10.0 fL    Platelet 242 150 - 440 10*9/L    Neutrophils % 64.8 %    Lymphocytes % 21.1 %    Monocytes % 8.3 %    Eosinophils % 1.7 %    Basophils % 2.0 %    Absolute Neutrophils 2.3 2.0 - 7.5 10*9/L    Absolute Lymphocytes 0.8 (L) 1.5 - 5.0 10*9/L    Absolute Monocytes 0.3 0.2 - 0.8 10*9/L    Absolute Eosinophils  0.1 0.0 - 0.4 10*9/L    Absolute Basophils 0.1 0.0 - 0.1 10*9/L    Large Unstained Cells 2 0 - 4 %    Microcytosis Slight (A) Not Present    Hypochromasia Marked (A) Not Present        Assessment and Plan    Andrew Diaz is a 62 y.o. 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 Active medical issues include:    1. 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 Serum creatinine today is 2.72 mg/dL and above s above his baseline range of 1.7-2.4 mg/dL.The creatinine change followed recent UTIs as well as relatively recent initiation of HCTZ.  UP/C is 0.316. Will recheck labs next week after hydration and consider renal biopsy if creatinine level remains elevated. Though he has class I donor specific antibodies to HLA-A1 and HLA-B8, he has no history of antibody mediated rejection.    2. Immunosuppression management. We will continue the current immunosuppressive regimen targeting cyclosporine levels of approximately 75-125. Today's level is not a trough.     3. Hypertension, controlled. He will continue his current regimen    4. History of UTI.  Symptoms could also be consistent with prostatitis. Will check urine culture today though UA does not suggest a current UTI.    5. Hyperlipidemia. Continue to hold Pravachol due to past myalgias with statins.     6. 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 Continue sodium bicarbonate 650 mg TID and HCTZ for K control. Will continue ACE-I unless K consistently >6; he has prn Kayexylate if needed for hyperkalemia that needs medical management.    7. Diabetes mellitus. Last hemoglobin A1c was 6.1 on 04/20/17. He will continue current therapy.     8. Testicle pain. Patient is asymptomatic today. PSA is normal (0.83). I have suggested patient follow up with local urologist.     9. Cancer screens. Patient had a colonoscopy revealing no polyps in 2016. Renal US on 06/22/16 revealed no masses.     10. Immunizations. Patient has received both doses of Shingrix (10/2016, 04/2017). Will receive Pneumovax 23 today (04/05/18).     11. Follow-up. The patient will be seen again in approximately 4 months. Recheck labs next week.     Scribe's Attestation: Jackey Loge, MD obtained and performed the history, physical exam and medical decision making elements that were entered into the chart.  Signed by Delaney Meigs, Scribe, on April 05, 2018 11:54 AM    ----------------------------------------------------------------------------------------------------------------------  April 08, 2018 8:14 PM. Documentation assistance provided by the Scribe. I was present during the time the encounter was recorded. The information recorded by the Scribe was done at my direction and has been reviewed and validated by me.  ----------------------------------------------------------------------------------------------------------------------

## 2018-04-05 ENCOUNTER — Ambulatory Visit: Admit: 2018-04-05 | Discharge: 2018-04-05 | Payer: MEDICARE | Attending: Nephrology | Primary: Nephrology

## 2018-04-05 ENCOUNTER — Ambulatory Visit: Admit: 2018-04-05 | Discharge: 2018-04-05 | Payer: MEDICARE

## 2018-04-05 DIAGNOSIS — Z79899 Other long term (current) drug therapy: Secondary | ICD-10-CM

## 2018-04-05 DIAGNOSIS — Z940 Kidney transplant status: Principal | ICD-10-CM

## 2018-04-05 DIAGNOSIS — D899 Disorder involving the immune mechanism, unspecified: Secondary | ICD-10-CM

## 2018-04-05 DIAGNOSIS — I10 Essential (primary) hypertension: Secondary | ICD-10-CM

## 2018-04-05 LAB — COMPREHENSIVE METABOLIC PANEL
ALBUMIN: 4.1 g/dL (ref 3.5–5.0)
ALKALINE PHOSPHATASE: 65 U/L (ref 38–126)
ALT (SGPT): 8 U/L (ref <50)
ANION GAP: 8 mmol/L (ref 7–15)
BILIRUBIN TOTAL: 0.7 mg/dL (ref 0.0–1.2)
BLOOD UREA NITROGEN: 60 mg/dL — ABNORMAL HIGH (ref 7–21)
BUN / CREAT RATIO: 22
CALCIUM: 9.6 mg/dL (ref 8.5–10.2)
CHLORIDE: 107 mmol/L (ref 98–107)
CO2: 23 mmol/L (ref 22.0–30.0)
CREATININE: 2.72 mg/dL — ABNORMAL HIGH (ref 0.70–1.30)
EGFR CKD-EPI AA MALE: 27 mL/min/{1.73_m2} — ABNORMAL LOW (ref >=60)
EGFR CKD-EPI NON-AA MALE: 24 mL/min/{1.73_m2} — ABNORMAL LOW (ref >=60)
GLUCOSE RANDOM: 117 mg/dL (ref 65–179)
POTASSIUM: 5.9 mmol/L — ABNORMAL HIGH (ref 3.5–5.0)
PROTEIN TOTAL: 7.9 g/dL (ref 6.5–8.3)
SODIUM: 138 mmol/L (ref 135–145)

## 2018-04-05 LAB — SMEAR REVIEW: Lab: 0

## 2018-04-05 LAB — CBC W/ AUTO DIFF
BASOPHILS ABSOLUTE COUNT: 0.1 10*9/L (ref 0.0–0.1)
BASOPHILS RELATIVE PERCENT: 2 %
EOSINOPHILS ABSOLUTE COUNT: 0.1 10*9/L (ref 0.0–0.4)
EOSINOPHILS RELATIVE PERCENT: 1.7 %
HEMATOCRIT: 40.4 % — ABNORMAL LOW (ref 41.0–53.0)
HEMOGLOBIN: 12.3 g/dL — ABNORMAL LOW (ref 13.5–17.5)
LARGE UNSTAINED CELLS: 2 % (ref 0–4)
LYMPHOCYTES ABSOLUTE COUNT: 0.8 10*9/L — ABNORMAL LOW (ref 1.5–5.0)
LYMPHOCYTES RELATIVE PERCENT: 21.1 %
MEAN CORPUSCULAR HEMOGLOBIN CONC: 30.4 g/dL — ABNORMAL LOW (ref 31.0–37.0)
MEAN CORPUSCULAR HEMOGLOBIN: 24.8 pg — ABNORMAL LOW (ref 26.0–34.0)
MONOCYTES ABSOLUTE COUNT: 0.3 10*9/L (ref 0.2–0.8)
MONOCYTES RELATIVE PERCENT: 8.3 %
NEUTROPHILS ABSOLUTE COUNT: 2.3 10*9/L (ref 2.0–7.5)
NEUTROPHILS RELATIVE PERCENT: 64.8 %
PLATELET COUNT: 242 10*9/L (ref 150–440)
RED BLOOD CELL COUNT: 4.96 10*12/L (ref 4.50–5.90)
RED CELL DISTRIBUTION WIDTH: 15.7 % — ABNORMAL HIGH (ref 12.0–15.0)
WBC ADJUSTED: 3.6 10*9/L — ABNORMAL LOW (ref 4.5–11.0)

## 2018-04-05 LAB — MAGNESIUM: Magnesium:MCnc:Pt:Ser/Plas:Qn:: 1.5 — ABNORMAL LOW

## 2018-04-05 LAB — PROTEIN / CREATININE RATIO, URINE
CREATININE, URINE: 32.9 mg/dL
PROTEIN/CREAT RATIO, URINE: 0.316

## 2018-04-05 LAB — PHOSPHORUS: Phosphate:MCnc:Pt:Ser/Plas:Qn:: 4.2

## 2018-04-05 LAB — RBC UA: Lab: 2

## 2018-04-05 LAB — EOSINOPHILS ABSOLUTE COUNT: Lab: 0.1

## 2018-04-05 LAB — PROSTATE SPECIFIC ANTIGEN: Prostate specific Ag:MCnc:Pt:Ser/Plas:Qn:: 0.83

## 2018-04-05 LAB — URINALYSIS
BILIRUBIN UA: NEGATIVE
BLOOD UA: NEGATIVE
GLUCOSE UA: NEGATIVE
KETONES UA: NEGATIVE
LEUKOCYTE ESTERASE UA: NEGATIVE
NITRITE UA: NEGATIVE
PH UA: 6.5 (ref 5.0–9.0)
PROTEIN UA: NEGATIVE
RBC UA: 2 /HPF (ref <=3)
SQUAMOUS EPITHELIAL: <1 /HPF (ref 0–5)
UROBILINOGEN UA: 0.2
WBC UA: 1 /HPF (ref <=2)

## 2018-04-05 LAB — CHLORIDE: Chloride:SCnc:Pt:Ser/Plas:Qn:: 107

## 2018-04-05 LAB — CREATININE, URINE: Lab: 32.9

## 2018-04-05 LAB — CYCLOSPORINE, TROUGH: Lab: 690 — ABNORMAL HIGH

## 2018-04-05 MED ORDER — SODIUM POLYSTYRENE SULFONATE 15 GRAM-SORBITOL 20 GRAM/60 ML ORAL SUSP
2 refills | 0 days | Status: CP
Start: 2018-04-05 — End: ?

## 2018-04-05 NOTE — Unmapped (Signed)
Cyclosporine trough of 690 was not a trough level. Pt. Took his Neoral at 7:30 that morning.

## 2018-04-05 NOTE — Unmapped (Signed)
A urine specimen was collected at the visit.

## 2018-04-06 LAB — CMV QUANT LOG10: Lab: 0

## 2018-04-06 LAB — CMV DNA, QUANTITATIVE, PCR: CMV QUANT: <50 [IU]/mL — ABNORMAL HIGH (ref <0)

## 2018-04-12 NOTE — Unmapped (Signed)
[facility] Specialty Pharmacy Refill and Clinical Coordination Note  Medication(s): gengraf 100 and 25 and mycophenolate    [name], [place]: 20-Jan-1956  Phone: [phone] (home) , Alternate phone contact: N/A  Shipping address: [address]  [place] [address]  Phone or address changes today?: No  All above HIPAA information verified.  Insurance changes? No    Completed refill and clinical call assessment today to schedule patient's medication shipment from the [facility] [phone]).      MEDICATION RECONCILIATION    Confirmed the medication and dosage are correct and have not changed: Yes, regimen is correct and unchanged.    Were there any changes to your medication(s) in the past month:  No, there are no changes reported at this time.    ADHERENCE    Is this medicine transplant or covered by Medicare Part B? Yes.    Gengraf 25mg : Patient has 12 days worth of tablets on hand.  Gengraf 100mg : Patient has 12 days worth of capsules on hand.  Mycophenolate mofetil 250mg : Patient has 12 days worth of capsules on hand.    Did you miss any doses in the past 4 weeks? No missed doses reported.  Adherence counseling provided? Not needed     SIDE EFFECT MANAGEMENT    Are you tolerating your medication?:  Catarino reports tolerating the medication.  Side effect management discussed: None      Therapy is appropriate and should be continued.    Evidence of clinical benefit: Do you feel that that the medication is helping? Yes      FINANCIAL/SHIPPING    Delivery Scheduled: Yes, Expected medication delivery date: 04/18/18     Medication will be delivered via Next Day Courier to the home address in [facility].    Additional medications refilled: No additional medications/refills needed at this time.    The patient will receive a drug information handout for each medication shipped and additional FDA Medication Guides as required.      Shalev did not have any additional questions at this time. Delivery address confirmed in Epic.     We will follow up with patient monthly for standard refill processing and delivery.      Thank you,  Thad Ranger   [facility] Shared [facility] Specialty Pharmacist

## 2018-04-17 MED FILL — GENGRAF 100 MG CAPSULE: ORAL | 30 days supply | Qty: 60 | Fill #4

## 2018-04-17 MED FILL — GENGRAF 25 MG CAPSULE: 30 days supply | Qty: 180 | Fill #4 | Status: AC

## 2018-04-17 MED FILL — GENGRAF 100 MG CAPSULE: 30 days supply | Qty: 60 | Fill #4 | Status: AC

## 2018-04-17 MED FILL — GENGRAF 25 MG CAPSULE: 30 days supply | Qty: 180 | Fill #4

## 2018-04-17 MED FILL — MYCOPHENOLATE MOFETIL 250 MG CAPSULE: 30 days supply | Qty: 120 | Fill #4 | Status: AC

## 2018-04-17 MED FILL — MYCOPHENOLATE MOFETIL 250 MG CAPSULE: 30 days supply | Qty: 120 | Fill #4

## 2018-05-09 MED ORDER — CYCLOSPORINE MODIFIED 100 MG CAPSULE
ORAL | 11 refills | 0 days | Status: CP
Start: 2018-05-09 — End: 2019-05-09

## 2018-05-09 NOTE — Unmapped (Signed)
[facility] Specialty Pharmacy Refill Coordination Note  Specialty Medication(s): mycophenolate 250mg , Gengraf 25mg , Gengraf 100mg   Additional Medications shipped: none    [name], DOB: 1955-10-05  Phone: [phone] (home) , Alternate phone contact: N/A  Phone or address changes today?: No  All above HIPAA information was verified with patient.  Shipping Address: [address]  [place] [address]   Insurance changes? No    Completed refill call assessment today to schedule patient's medication shipment from the [facility] [phone]).      Confirmed the medication and dosage are correct and have not changed: Yes, regimen is correct and unchanged.    Confirmed patient started or stopped the following medications in the past month:  No, there are no changes reported at this time.    Are you tolerating your medication?:  Nehemyah reports tolerating the medication.    ADHERENCE    Gengraf 100 mg   Quantity filled last month: 60   # of tablets left on hand: 10 days  Gengraf 25 mg   Quantity filled last month: 180   # of tablets left on hand: 10 days  Mycophenolate Mofetil 250 mg   Quantity filled last month: 120   # of tablets left on hand: 10 days      Did you miss any doses in the past 4 weeks? No missed doses reported.    FINANCIAL/SHIPPING    Delivery Scheduled: Yes, Expected medication delivery date: 05/16/18.  However, Rx request for refills was sent to the provider as there are none remaining.     Medication will be delivered via UPS to the home address in [facility].    The patient will receive a drug information handout for each medication shipped and additional FDA Medication Guides as required.      Younes did not have any additional questions at this time.    We will follow up with patient monthly for standard refill processing and delivery.      Thank you,  Lupita Shutter   [facility] Specialty Pharmacist

## 2018-05-15 MED ORDER — CYCLOSPORINE MODIFIED 25 MG CAPSULE
11 refills | 0 days | Status: CP
Start: 2018-05-15 — End: 2019-05-15
  Filled 2018-05-15: qty 60, 30d supply, fill #0

## 2018-05-15 MED ORDER — MYCOPHENOLATE MOFETIL 250 MG CAPSULE
ORAL_CAPSULE | 11 refills | 0 days | Status: CP
Start: 2018-05-15 — End: 2019-05-15
  Filled 2018-05-15: qty 120, 30d supply, fill #0

## 2018-05-15 MED FILL — GENGRAF 100 MG CAPSULE: 30 days supply | Qty: 60 | Fill #0 | Status: AC

## 2018-05-15 MED FILL — GENGRAF 25 MG CAPSULE: 30 days supply | Qty: 180 | Fill #0 | Status: AC

## 2018-05-15 MED FILL — GENGRAF 25 MG CAPSULE: 30 days supply | Qty: 180 | Fill #0

## 2018-05-15 MED FILL — MYCOPHENOLATE MOFETIL 250 MG CAPSULE: 30 days supply | Qty: 120 | Fill #0 | Status: AC

## 2018-05-18 MED ORDER — TRAMADOL 50 MG TABLET
ORAL_TABLET | 0 refills | 0 days | Status: CP
Start: 2018-05-18 — End: 2018-08-24

## 2018-05-30 ENCOUNTER — Encounter: Admit: 2018-05-30 | Discharge: 2018-05-31 | Payer: MEDICARE | Attending: Urology | Primary: Urology

## 2018-05-30 DIAGNOSIS — R399 Unspecified symptoms and signs involving the genitourinary system: Secondary | ICD-10-CM

## 2018-05-30 DIAGNOSIS — Z79899 Other long term (current) drug therapy: Secondary | ICD-10-CM

## 2018-05-30 DIAGNOSIS — N419 Inflammatory disease of prostate, unspecified: Secondary | ICD-10-CM

## 2018-05-30 DIAGNOSIS — Z940 Kidney transplant status: Principal | ICD-10-CM

## 2018-05-30 NOTE — Unmapped (Signed)
IDENTIFICATION/CHIEF COMPLAINT:   A 63 y.o.-year-old male seen today in consultation at the request of Clayborne Artist, * for LUTS and prostate cancer screening.    HISTORY OF PRESENT ILLNESS:   The patient became concerned approximately a month ago when he developed testicular pain bilaterally.  He then made arrangements to see urology.  A PSA was done at approximately that time, in December 2019, and returned at 0.83.  He describes the testicular pain as an ache, and it resolved spontaneously, albeit slowly.  He never took any medication for this.  Urine culture and urine cytology was negative at that time.    He has urinary symptoms, which he ascribes to diuretic therapy he takes among other medications for management of his renal transplant.  His IPSS today is 9 with a bother score of 3.  The main components of this are frequency and urgency.  He occasionally develops urge incontinence before he can get to the bathroom.  He thinks this occurs when he waits too long to locate the restroom.  He denies hesitancy and intermittency.  He reports a good stream, but some postvoid dribbling.  He has never had gross hematuria.      PAST MEDICAL HISTORY:   Past Medical History:   Diagnosis Date   ??? Cataract    ??? Diabetes mellitus (CMS-HCC)    ??? Diabetic retinopathy (CMS-HCC)    ??? GERD (gastroesophageal reflux disease)    ??? Hypercholesteremia    ??? Hypertension        PAST SURGICAL HISTORY:   Past Surgical History:   Procedure Laterality Date   ??? CHOLECYSTECTOMY     ??? NEPHRECTOMY TRANSPLANTED ORGAN     ??? PR COLONOSCOPY FLX DX W/COLLJ SPEC WHEN PFRMD N/A 10/14/2014    Procedure: COLONOSCOPY, FLEXIBLE, PROXIMAL TO SPLENIC FLEXURE; DIAGNOSTIC, W/WO COLLECTION SPECIMEN BY BRUSH OR WASH;  Surgeon: Donneta Romberg, MD;  [facility];  Service: Gastroenterology   ??? PR EYE SURG POST SGMT PROC UNLISTED Left 06/01/10    PPV/EL/MP   ??? PR TRABECULOPLASTY BY LASER SURGERY Bilateral     PRP   ??? PR XCAPSL CTRC RMVL INSJ IO LENS PROSTH W/O ECP Left 12/13/2012    Procedure: EXTRACAPSULAR CATARACT REMOVAL W/INSERTION OF INTRAOCULAR LENS PROSTHESIS, MANUAL OR MECHANICAL TECHNIQUE OS ZCB00 22.5 and ZA9003 22.0 and 21.5; 0.7 mm phaco with gold sleeve, 1.1 mm slit knife, 2.2 mm keratome;  Surgeon: Nadine Counts, MD;  Location: [facility];  Service: Ophthalmology       MEDICATIONS:   Current Outpatient Medications   Medication Sig Dispense Refill   ??? aspirin, buffered 81 mg Tab Take 81 mg by mouth. Frequency:QD   Dosage:81   MG  Instructions:  Note:Dose: 81MG      ??? cholecalciferol, vitamin D3, (VITAMIN D3) 2,000 unit cap Take 2,000 Units by mouth every other day.     ??? cycloSPORINE modified (NEORAL) 100 MG capsule TAKE 1 CAPSULE BY MOUTH TWICE DAILY 60 each 11   ??? cycloSPORINE modified (NEORAL) 25 MG capsule TAKE 3 CAPSULES (75MG ) BY MOUTH TWICE DAILY 180 each 11   ??? docusate sodium (COLACE) 100 MG capsule Take 100 mg by mouth two (2) times a day as needed. Frequency:BID   Dosage:100   MG  Instructions:  Note:Dose: 100MG      ??? enalapril (VASOTEC) 10 MG tablet Take 2 tablets (20 mg total) by mouth Two (2) times a day. 120 tablet 5   ??? furosemide (LASIX)  20 MG tablet TAKE 1 TABLET BY MOUTH DAILY AS NEEDED 90 tablet 3   ??? hydrALAZINE (APRESOLINE) 25 MG tablet TAKE 3 TABLETS (75 MG TOTAL) BY MOUTH THREE (3) TIMES A DAY. 810 tablet 2   ??? hydroCHLOROthiazide (HYDRODIURIL) 50 MG tablet TAKE 1 TABLET (50 MG TOTAL) BY MOUTH DAILY. 90 tablet 3   ??? lancets Misc (E11.39); NPI: [id number]. Check blood sugar 3 times daily before meals 100 each 11   ??? magnesium oxide (MAGOX) 400 mg (241.3 mg magnesium) tablet Take 1 tablet (400 mg total) by mouth Two (2) times a day. 60 tablet 3   ??? MEDICAL SUPPLY ITEM (E11.39); NPI: [id number]. Check blood sugar 3 times daily before meals 1 Package 0   ??? metoprolol tartrate (LOPRESSOR) 50 MG tablet TAKE 1 TABLET BY MOUTH TWICE A DAY 180 tablet 3   ??? mycophenolate (CELLCEPT) 250 mg capsule TAKE 2 CAPSULES (500MG ) BY MOUTH TWICE DAILY 120 capsule 11   ??? omeprazole (PRILOSEC) 40 MG capsule TAKE 1 CAPSULE (40 MG TOTAL) BY MOUTH DAILY. 30 capsule 11   ??? sodium bicarbonate 650 mg tablet TAKE 1 TABLET (650 MG TOTAL) BY MOUTH THREE (3) TIMES A DAY. 270 tablet 1   ??? sodium polystyrene, SPS, with sorbitol 15-20 gram/60 mL Susp Take 15 Gm daily as needed  For elevated potassium level. 360 mL 2   ??? traMADol (ULTRAM) 50 mg tablet TAKE 1 TABLET BY MOUTH EVERY 6 HOURS AS NEEDED FOR PAIN 120 tablet 0     No current facility-administered medications for this visit.        ALLERGIES:   Allergies   Allergen Reactions   ??? Pollen Extracts Other (See Comments)     cough       FAMILY HISTORY:   Family History   Problem Relation Age of Onset   ??? Diabetes Mother    ??? Diabetes Father    ??? Heart disease Father    ??? Glaucoma Neg Hx    ??? Retinal detachment Neg Hx    ??? Strabismus Neg Hx    ??? Macular degeneration Neg Hx    ??? Amblyopia Neg Hx    ??? Blindness Neg Hx        SOCIAL HISTORY:   Social History     Socioeconomic History   ??? Marital status: Single     Spouse name: None   ??? Number of children: None   ??? Years of education: None   ??? Highest education level: None   Occupational History   ??? None   Social Needs   ??? Financial resource strain: None   ??? Food insecurity     Worry: None     Inability: None   ??? Transportation needs     Medical: None     Non-medical: None   Tobacco Use   ??? Smoking status: Never Smoker   ??? Smokeless tobacco: Never Used   Substance and Sexual Activity   ??? Alcohol use: No   ??? Drug use: No   ??? Sexual activity: None   Lifestyle   ??? Physical activity     Days per week: None     Minutes per session: None   ??? Stress: None   Relationships   ??? Social Wellsite geologist on phone: None     Gets together: None     Attends religious service: None     Active member of club or organization: None  Attends meetings of clubs or organizations: None     Relationship status: None   Other Topics Concern   ??? None   Social History Narrative   ??? None       REVIEW OF SYSTEMS: Significant for none; otherwise negative upon 10-system review other than what is mentioned in the HPI.     PHYSICAL EXAM:   GENERAL: Pleasant male in no acute distress.   VITAL SIGNS: Blood pressure 227/101, pulse 62, temperature 36.6 ??C (97.8 ??F), temperature source Temporal, height 188 cm (6' 2.02), weight (!) 101.4 kg (223 lb 8.7 oz).  HEENT: Normocephalic, atraumatic, extraocular muscles intact  NECK: Supple, no lymphadenopathy  CARDIOVASCULAR: No peripheral edema  PULMONARY: Normal work of breathing, no use of accessory muscles  ABDOMEN: Soft, non-tender, non-distended. No organomegaly or hernias.  BACK: No costovertebral angle tenderness, no spiny bone tenderness.   EXTREMITIES: No clubbing, cyanosis or edema.   NEUROLOGIC:  Cranial nerves II-XII grossly intact  PSYCHOLOGIC: Normal affect, normal mood  SKIN: Warm and dry. No lesions.  Genitourinary exam: Prostate is small, smooth, soft, symmetric, nonnodular, and nontender.    RESULTS:   Bladder scan postvoid residual equals 7 cc      ASSESSMENT AND PLAN:  Andrew Diaz is a 63 y.o. male with lower urinary tract symptoms.  We had a detailed discussion about these, and I told him that the decision to pursue medical therapy at this point is based on how much his symptoms bother him.  We talked about the use of tamsulosin for the symptoms.  I do not think that he should have an overactive bladder medication yet, but this could be considered as primary intervention as well.  However, the patient reported that he is not bothered so much that he wants to take yet another medication given his complex regimen, and I completely support this.  If his symptoms become more bothersome in the future, the discussion should be revisited, and either overactive bladder drug or tamsulosin could be instituted.  He mainly seemed relieved that he did not have a high risk of prostate cancer.  I recommended annual prostate cancer screening until age 59.

## 2018-06-08 NOTE — Unmapped (Signed)
[facility] Refill Coordination Note    Specialty Medication(s) to be Shipped:   Transplant: mycophenolate mofetil 250mg , Gengraf 25mg  and Gengraf 100mg     Other medication(s) to be shipped: None     [name], DOB: [birth year]  Phone: [phone] (home)       All above HIPAA information was verified with patient.     Completed refill call assessment today to schedule patient's medication shipment from the [facility] [phone]).       Specialty medication(s) and dose(s) confirmed: Regimen is correct and unchanged.   Changes to medications: Nochum reports no changes reported at this time.  Changes to insurance: No  Questions for the pharmacist: No    The patient will receive a drug information handout for each medication shipped and additional FDA Medication Guides as required.      DISEASE/MEDICATION-SPECIFIC INFORMATION        N/A    ADHERENCE     Medication Adherence    Patient reported X missed doses in the last month:  0  Specialty Medication:  Mycophenolate 250mg , Gengraf 25mg  & 100mg    Adherence tools used:  patient uses a pill box to manage medications  Support network for adherence:  family member              MEDICARE PART B DOCUMENTATION     Gengraf 25mg : Patient has 10 days worth of tablets on hand.  Gengraf 100mg : Patient has 10 days worth of capsules on hand.  Mycophenolate mofetil 250mg : Patient has 10 days worth of capsules on hand.    SHIPPING     Shipping address confirmed in Epic.     Delivery Scheduled: Yes, Expected medication delivery date: 06/14/18 via UPS or courier.     Medication will be delivered via UPS to the home address in Epic WAM.    Andrew Diaz   [facility] Specialty Technician

## 2018-06-13 MED FILL — MYCOPHENOLATE MOFETIL 250 MG CAPSULE: 30 days supply | Qty: 120 | Fill #1

## 2018-06-13 MED FILL — GENGRAF 25 MG CAPSULE: 30 days supply | Qty: 180 | Fill #1 | Status: AC

## 2018-06-13 MED FILL — GENGRAF 100 MG CAPSULE: ORAL | 30 days supply | Qty: 60 | Fill #1

## 2018-06-13 MED FILL — GENGRAF 100 MG CAPSULE: 30 days supply | Qty: 60 | Fill #1 | Status: AC

## 2018-06-13 MED FILL — MYCOPHENOLATE MOFETIL 250 MG CAPSULE: 30 days supply | Qty: 120 | Fill #1 | Status: AC

## 2018-06-13 MED FILL — GENGRAF 25 MG CAPSULE: 30 days supply | Qty: 180 | Fill #1

## 2018-07-10 NOTE — Unmapped (Signed)
[facility] Refill Coordination Note    Specialty Medication(s) to be Shipped:   Transplant:  mycophenolic acid 250mg mg, Gengraf 25 and Gengraf 100    Other medication(s) to be shipped:       Andrew Diaz, DOB: 10/01/1955  Phone: [phone] (home)       All above HIPAA information was verified with patient.     Completed refill call assessment today to schedule patient's medication shipment from the [facility] [phone]).       Specialty medication(s) and dose(s) confirmed: Regimen is correct and unchanged.   Changes to medications: Travelle reports no changes reported at this time.  Changes to insurance: No  Questions for the pharmacist: No    Confirmed patient received Welcome Packet with first shipment. The patient will receive a drug information handout for each medication shipped and additional FDA Medication Guides as required.       DISEASE/MEDICATION-SPECIFIC INFORMATION        N/A    SPECIALTY MEDICATION ADHERENCE     Medication Adherence    Patient reported X missed doses in the last month:  0  Specialty Medication:  Mycophenolate 250mg   Patient is on additional specialty medications:  Yes  Additional Specialty Medications:  Gengraf 25g  Patient Reported Additional Medication X Missed Doses in the Last Month:  0  Patient is on more than two specialty medications:  Yes  Specialty Medication:  Gengraf 100mg   Patient Reported Additional Medication X Missed Doses in the Last Month:  0  Adherence tools used:  patient uses a pill box to manage medications  Support network for adherence:  family member                Gengraf 100 mg: 7 days of medicine on hand   Gengraf 25 mg: 7 days of medicine on hand  Mycophenolate 250 mg: 7 days of medicine on hand        SHIPPING     Shipping address confirmed in Epic.     Delivery Scheduled: Yes, Expected medication delivery date: 031120.     Medication will be delivered via UPS to the home address in Epic WAM.    Antonietta Barcelona   [facility] Specialty Technician

## 2018-07-11 MED FILL — MYCOPHENOLATE MOFETIL 250 MG CAPSULE: 30 days supply | Qty: 120 | Fill #2 | Status: AC

## 2018-07-11 MED FILL — GENGRAF 25 MG CAPSULE: 30 days supply | Qty: 180 | Fill #2

## 2018-07-11 MED FILL — GENGRAF 100 MG CAPSULE: ORAL | 30 days supply | Qty: 60 | Fill #2

## 2018-07-11 MED FILL — GENGRAF 100 MG CAPSULE: 30 days supply | Qty: 60 | Fill #2 | Status: AC

## 2018-07-11 MED FILL — MYCOPHENOLATE MOFETIL 250 MG CAPSULE: 30 days supply | Qty: 120 | Fill #2

## 2018-07-11 MED FILL — GENGRAF 25 MG CAPSULE: 30 days supply | Qty: 180 | Fill #2 | Status: AC

## 2018-07-13 NOTE — Unmapped (Signed)
-----   Message from Sherrie George sent at 07/13/2018 12:28 AM EDT -----  Regarding: FW: PCP visit needed  Renae Fickle, can you please assist with getting this patient an appointment to follow up with Dr. Casimer Lanius per Hyman Bower message below.  Thank you  Vickie  07-12-2018  ----- Message -----  From: Jeanella Flattery, CPP  Sent: 07/06/2018   3:03 PM EDT  To: , #  Subject: PCP visit needed                                 This patient is out of care. Please call to offer a PCP visit. If they are seeing a new PCP, please update in Epic.    Thank you,  Forde Radon, PharmD, CPP  [facility]

## 2018-07-13 NOTE — Unmapped (Signed)
Called and mailbox is full,will send reminder to schedule letter

## 2018-07-14 DIAGNOSIS — Z940 Kidney transplant status: Principal | ICD-10-CM

## 2018-07-14 DIAGNOSIS — I10 Essential (primary) hypertension: Principal | ICD-10-CM

## 2018-07-14 MED ORDER — METOPROLOL TARTRATE 50 MG TABLET
ORAL_TABLET | Freq: Two times a day (BID) | ORAL | 3 refills | 0 days | Status: CP
Start: 2018-07-14 — End: ?

## 2018-08-04 NOTE — Unmapped (Signed)
Called both #'s left message to change appointment for 4/7 @ 8:30am with Dr. Margaretmary Bayley to a phone visit.

## 2018-08-07 NOTE — Unmapped (Signed)
[facility] Clinical Assessment & Refill Coordination Note    Andrew Diaz, [place]: 11-09-1955  Phone: [phone] (home)     All above HIPAA information was verified with patient.     Specialty Medication(s):   Transplant: mycophenolate mofetil 250mg , Gengraf 25mg  and Gengraf 100mg      Current Outpatient Medications   Medication Sig Dispense Refill   ??? aspirin, buffered 81 mg Tab Take 81 mg by mouth. Frequency:QD   Dosage:81   MG  Instructions:  Note:Dose: 81MG      ??? cholecalciferol, vitamin D3, (VITAMIN D3) 2,000 unit cap Take 2,000 Units by mouth every other day.     ??? cycloSPORINE modified (NEORAL) 100 MG capsule TAKE 1 CAPSULE BY MOUTH TWICE DAILY 60 each 11   ??? cycloSPORINE modified (NEORAL) 25 MG capsule TAKE 3 CAPSULES (75MG ) BY MOUTH TWICE DAILY 180 each 11   ??? docusate sodium (COLACE) 100 MG capsule Take 100 mg by mouth two (2) times a day as needed. Frequency:BID   Dosage:100   MG  Instructions:  Note:Dose: 100MG      ??? enalapril (VASOTEC) 10 MG tablet Take 2 tablets (20 mg total) by mouth Two (2) times a day. 120 tablet 5   ??? furosemide (LASIX) 20 MG tablet TAKE 1 TABLET BY MOUTH DAILY AS NEEDED 90 tablet 3   ??? hydrALAZINE (APRESOLINE) 25 MG tablet TAKE 3 TABLETS (75 MG TOTAL) BY MOUTH THREE (3) TIMES A DAY. 810 tablet 2   ??? hydroCHLOROthiazide (HYDRODIURIL) 50 MG tablet TAKE 1 TABLET (50 MG TOTAL) BY MOUTH DAILY. 90 tablet 3   ??? lancets Misc (E11.39); NPI: [id number]. Check blood sugar 3 times daily before meals 100 each 11   ??? magnesium oxide (MAGOX) 400 mg (241.3 mg magnesium) tablet Take 1 tablet (400 mg total) by mouth Two (2) times a day. 60 tablet 3   ??? MEDICAL SUPPLY ITEM (E11.39); NPI: [id number]. Check blood sugar 3 times daily before meals 1 Package 0   ??? metoprolol tartrate (LOPRESSOR) 50 MG tablet Take 1 tablet (50 mg total) by mouth Two (2) times a day. 180 tablet 3   ??? mycophenolate (CELLCEPT) 250 mg capsule TAKE 2 CAPSULES (500MG ) BY MOUTH TWICE DAILY 120 capsule 11   ??? omeprazole (PRILOSEC) 40 MG capsule TAKE 1 CAPSULE (40 MG TOTAL) BY MOUTH DAILY. 30 capsule 11   ??? sodium bicarbonate 650 mg tablet TAKE 1 TABLET (650 MG TOTAL) BY MOUTH THREE (3) TIMES A DAY. 270 tablet 1   ??? sodium polystyrene, SPS, with sorbitol 15-20 gram/60 mL Susp Take 15 Gm daily as needed  For elevated potassium level. 360 mL 2   ??? traMADol (ULTRAM) 50 mg tablet TAKE 1 TABLET BY MOUTH EVERY 6 HOURS AS NEEDED FOR PAIN 120 tablet 0     No current facility-administered medications for this visit.         Changes to medications: [name] reports no changes reported at this time.    Allergies   Allergen Reactions   ??? Pollen Extracts Other (See Comments)     cough       Changes to allergies: No    SPECIALTY MEDICATION ADHERENCE     Gengraf 25 mg: 7 days of medicine on hand   Gengraf 100 mg: 7 days of medicine on hand   Mycophenolate 250 mg: 7 days of medicine on hand     Medication Adherence    Patient reported X missed doses in the last month:  0  Specialty Medication:  mycophenolate 250mg   Patient is on additional specialty medications:  Yes  Additional Specialty Medications:  Gengraf 25  Patient Reported Additional Medication X Missed Doses in the Last Month:  0  Patient is on more than two specialty medications:  Yes  Specialty Medication:  Gengraf 100mg   Adherence tools used:  patient uses a pill box to manage medications  Support network for adherence:  family member          Specialty medication(s) dose(s) confirmed: Regimen is correct and unchanged.     Are there any concerns with adherence? No    Adherence counseling provided? Not needed    CLINICAL MANAGEMENT AND INTERVENTION      Clinical Benefit Assessment:    Do you feel the medicine is effective or helping your condition? Yes    Clinical Benefit counseling provided? Not needed    Adverse Effects Assessment:    Are you experiencing any side effects? No    Are you experiencing difficulty administering your medicine? No    Quality of Life Assessment:    How many days over the past month did your kidney transplant  keep you from your normal activities? For example, brushing your teeth or getting up in the morning. 0    Have you discussed this with your provider? Not needed    Therapy Appropriateness:    Is therapy appropriate? Yes, therapy is appropriate and should be continued    DISEASE/MEDICATION-SPECIFIC INFORMATION      N/A    PATIENT SPECIFIC NEEDS     ? Does the patient have any physical, cognitive, or cultural barriers? No    ? Is the patient high risk? Yes, patient taking a REMS drug     ? Does the patient require a Care Management Plan? No     ? Does the patient require physician intervention or other additional services (i.e. nutrition, smoking cessation, social work)? No      SHIPPING     Specialty Medication(s) to be Shipped:   Transplant: mycophenolate mofetil 250mg , Gengraf 25mg  and Gengraf 100mg     Other medication(s) to be shipped: NONE     Changes to insurance: No    Delivery Scheduled: Yes, Expected medication delivery date: 07/10/2018.     Medication will be delivered via UPS to the confirmed home address in [facility].    The patient will receive a drug information handout for each medication shipped and additional FDA Medication Guides as required.  Verified that patient has previously received a Conservation officer, historic buildings.    Tera Helper   [facility] Specialty Pharmacist

## 2018-08-08 ENCOUNTER — Encounter: Admit: 2018-08-08 | Discharge: 2018-08-09 | Payer: MEDICARE | Attending: Nephrology | Primary: Nephrology

## 2018-08-08 DIAGNOSIS — Z48298 Encounter for aftercare following other organ transplant: Secondary | ICD-10-CM

## 2018-08-08 DIAGNOSIS — Z940 Kidney transplant status: Principal | ICD-10-CM

## 2018-08-08 DIAGNOSIS — I10 Essential (primary) hypertension: Secondary | ICD-10-CM

## 2018-08-08 DIAGNOSIS — D899 Disorder involving the immune mechanism, unspecified: Secondary | ICD-10-CM

## 2018-08-08 NOTE — Unmapped (Signed)
Transplant Nephrology??Telephone??Visit  ??  Reason for Visit  A telephone visit was performed today due to the COVID-19 pandemic.??He is??evaluated today??for??follow-up of??her??kidney??transplant, immunosuppression management??and management of other medical problems.??Patient was willing to accept/consent to the charge for this visit.??I spent??18??minutes with the patient on the call including answering questions and providing counseling about COVID-19.    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 He also has a history of a class I donor specific antibodies, but has no history of rejection or significant proteinuria with baseline serum creatinine 1.6-2.4 mg/dL.    Interval History:  He is without complaints today. He denies cough, shortness of breath,altered sense of smell or taste, fever, nausea, vomiting,diarrhea, dysuria, graft tenderness, or chest pain. He does have mild edema and is taking lasix and HCTZ daily. He saw a urologist for BPH and testicular pain since his last visit. These symptoms have now resolved. Home BP's are in the 140-160 range systolic and the 16'X diastolic.  Blood glucoses have been in the low 100s. He has made no medication changes.     Review of Systems     All other systems are reviewed and are negative.  A 10 systems review was completed.    Medications    Current Outpatient Medications   Medication Sig Dispense Refill   ??? aspirin, buffered 81 mg Tab Take 81 mg by mouth. Frequency:QD   Dosage:81   MG  Instructions:  Note:Dose: 81MG      ??? cholecalciferol, vitamin D3, (VITAMIN D3) 2,000 unit cap Take 2,000 Units by mouth every other day.     ??? cycloSPORINE modified (NEORAL) 100 MG capsule TAKE 1 CAPSULE BY MOUTH TWICE DAILY 60 each 11   ??? cycloSPORINE modified (NEORAL) 25 MG capsule TAKE 3 CAPSULES (75MG ) BY MOUTH TWICE DAILY 180 each 11   ??? docusate sodium (COLACE) 100 MG capsule Take 100 mg by mouth two (2) times a day as needed. Frequency:BID   Dosage:100   MG  Instructions:  Note:Dose: 100MG      ??? enalapril (VASOTEC) 10 MG tablet Take 2 tablets (20 mg total) by mouth Two (2) times a day. 120 tablet 5   ??? furosemide (LASIX) 20 MG tablet TAKE 1 TABLET BY MOUTH DAILY AS NEEDED 90 tablet 3   ??? hydrALAZINE (APRESOLINE) 25 MG tablet TAKE 3 TABLETS (75 MG TOTAL) BY MOUTH THREE (3) TIMES A DAY. 810 tablet 2   ??? hydroCHLOROthiazide (HYDRODIURIL) 50 MG tablet TAKE 1 TABLET (50 MG TOTAL) BY MOUTH DAILY. 90 tablet 3   ??? lancets Misc (E11.39); NPI: [id number]. Check blood sugar 3 times daily before meals 100 each 11   ??? magnesium oxide (MAGOX) 400 mg (241.3 mg magnesium) tablet Take 1 tablet (400 mg total) by mouth Two (2) times a day. 60 tablet 3   ??? MEDICAL SUPPLY ITEM (E11.39); NPI: [id number]. Check blood sugar 3 times daily before meals 1 Package 0   ??? metoprolol tartrate (LOPRESSOR) 50 MG tablet Take 1 tablet (50 mg total) by mouth Two (2) times a day. 180 tablet 3   ??? mycophenolate (CELLCEPT) 250 mg capsule TAKE 2 CAPSULES (500MG ) BY MOUTH TWICE DAILY 120 capsule 11   ??? omeprazole (PRILOSEC) 40 MG capsule TAKE 1 CAPSULE (40 MG TOTAL) BY MOUTH DAILY. 30 capsule 11   ??? sodium  bicarbonate 650 mg tablet TAKE 1 TABLET (650 MG TOTAL) BY MOUTH THREE (3) TIMES A DAY. 270 tablet 1   ??? sodium polystyrene, SPS, with sorbitol 15-20 gram/60 mL Susp Take 15 Gm daily as needed  For elevated potassium level. 360 mL 2   ??? traMADol (ULTRAM) 50 mg tablet TAKE 1 TABLET BY MOUTH EVERY 6 HOURS AS NEEDED FOR PAIN 120 tablet 0     No current facility-administered medications for this visit.        Physical Exam    No exam was performed today.     Laboratory Results    No results found for this or any previous visit (from the past 170 hour(s)).     Assessment and Plan    [name] is a 63 y.o. 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 Active medical issues include:    1. 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 Though he has class I DSAs he has no history of rejection. Serum creatinine was most recently 2.72 mg/dL on 45/08/979. This was above his baseline range of 1.7-2.4 mg/dL and was felt to be due to his UTI and initiation of HCTZ. I have suggested he obtain labs and have taken into consideration the relative risk of a laboratory visit compared to benefit. He will obtain labs soon.      2. Immunosuppression management. We will continue the current immunosuppressive regimen targeting cyclosporine levels of approximately 75-125. The level from 04/05/2018 of 690 was after his dose and not a trough. He will continue cyclosporine 175 mg BID and CellCept 500 mg BID.     3. Hypertension. Systolic BP has been above target range despite a multi-drug regimen.     4. History of UTI. He has no current symptoms of UTI.     5. Hyperlipidemia. He was intolerant of Pravachol due to myalgias experienced with all statins.     6. 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 Continue sodium bicarbonate 650 mg TID and HCTZ for K control. Will continue ACE-I unless K consistently >6; he has prn Kayexylate if needed for hyperkalemia that needs medical management.    7. Diabetes mellitus, controlled. Will continue current therapy.     8. Cancer screens. Patient had a colonoscopy revealing no polyps in 2016. Renal US on 06/22/16 revealed no masses. Recently saw urology for prostate screen.     9. Immunizations. Patient has received both doses of Shingrix (10/2016, 04/2017). Other vaccines are up-to-date.     10. Follow-up. He will have labs drawn soon. He will be seen again in clinic in 3 months.

## 2018-08-09 MED FILL — GENGRAF 25 MG CAPSULE: 30 days supply | Qty: 180 | Fill #3 | Status: AC

## 2018-08-09 MED FILL — GENGRAF 100 MG CAPSULE: 30 days supply | Qty: 60 | Fill #3 | Status: AC

## 2018-08-09 MED FILL — GENGRAF 25 MG CAPSULE: 30 days supply | Qty: 180 | Fill #3

## 2018-08-09 MED FILL — MYCOPHENOLATE MOFETIL 250 MG CAPSULE: 30 days supply | Qty: 120 | Fill #3

## 2018-08-09 MED FILL — GENGRAF 100 MG CAPSULE: ORAL | 30 days supply | Qty: 60 | Fill #3

## 2018-08-09 MED FILL — MYCOPHENOLATE MOFETIL 250 MG CAPSULE: 30 days supply | Qty: 120 | Fill #3 | Status: AC

## 2018-08-09 NOTE — Unmapped (Signed)
Delivery date- 08/10/18  Marletta Lor, Pharmacist  [facility]  [phone]

## 2018-08-24 MED ORDER — TRAMADOL 50 MG TABLET
ORAL_TABLET | 1 refills | 0 days | Status: CP
Start: 2018-08-24 — End: ?

## 2018-08-28 MED ORDER — ENALAPRIL MALEATE 10 MG TABLET
ORAL_TABLET | Freq: Two times a day (BID) | ORAL | 0 refills | 0.00000 days | Status: CP
Start: 2018-08-28 — End: 2018-10-06

## 2018-08-28 NOTE — Unmapped (Signed)
Patient needs to schedule an appointment

## 2018-09-01 NOTE — Unmapped (Signed)
[facility] Refill Coordination Note    Specialty Medication(s) to be Shipped:   Transplant: mycophenolate mofetil 250mg , Gengraf 25mg  and Gengraf 100mg      [name], DOB: 04/20/1956  Phone: [phone] (home)     All above HIPAA information was verified with patient.     Completed refill call assessment today to schedule patient's medication shipment from the [facility] [phone]).       Specialty medication(s) and dose(s) confirmed: Regimen is correct and unchanged.   Changes to medications: Andrew Diaz reports no changes reported at this time.  Changes to insurance: No  Questions for the pharmacist: No    Confirmed patient received Welcome Packet with first shipment. The patient will receive a drug information handout for each medication shipped and additional FDA Medication Guides as required.       DISEASE/MEDICATION-SPECIFIC INFORMATION        N/A    SPECIALTY MEDICATION ADHERENCE     Medication Adherence    Patient reported X missed doses in the last month:  0  Specialty Medication:  Gengraf 100mg   Patient is on additional specialty medications:  Yes  Additional Specialty Medications:  Gengraf 25mg   Patient Reported Additional Medication X Missed Doses in the Last Month:  0  Patient is on more than two specialty medications:  Yes  Specialty Medication:  Mycophenolate 250mg   Patient Reported Additional Medication X Missed Doses in the Last Month:  0  Adherence tools used:  patient uses a pill box to manage medications  Support network for adherence:  family member        Gengraf 25 mg: 9 days of medicine on hand   Gengraf 100 mg: 9 days of medicine on hand   Mycophenolate 250 mg: 9 days of medicine on hand     SHIPPING     Shipping address confirmed in Epic.     Delivery Scheduled: Yes, Expected medication delivery date: 09/08/2018.     Medication will be delivered via UPS to the home address in [place].    Andrew Diaz P Andrew Diaz   [facility] Shared [facility] Specialty Technician

## 2018-09-07 MED FILL — MYCOPHENOLATE MOFETIL 250 MG CAPSULE: 30 days supply | Qty: 120 | Fill #4 | Status: AC

## 2018-09-07 MED FILL — GENGRAF 100 MG CAPSULE: 30 days supply | Qty: 60 | Fill #4 | Status: AC

## 2018-09-07 MED FILL — GENGRAF 25 MG CAPSULE: 30 days supply | Qty: 180 | Fill #4 | Status: AC

## 2018-09-07 MED FILL — GENGRAF 100 MG CAPSULE: ORAL | 30 days supply | Qty: 60 | Fill #4

## 2018-09-07 MED FILL — MYCOPHENOLATE MOFETIL 250 MG CAPSULE: 30 days supply | Qty: 120 | Fill #4

## 2018-09-07 MED FILL — GENGRAF 25 MG CAPSULE: 30 days supply | Qty: 180 | Fill #4

## 2018-09-29 MED ORDER — OMEPRAZOLE 40 MG CAPSULE,DELAYED RELEASE
ORAL_CAPSULE | Freq: Every day | ORAL | 11 refills | 0 days | Status: CP
Start: 2018-09-29 — End: ?

## 2018-10-06 MED ORDER — ENALAPRIL MALEATE 10 MG TABLET
ORAL_TABLET | Freq: Two times a day (BID) | ORAL | 11 refills | 0 days | Status: CP
Start: 2018-10-06 — End: 2019-10-06

## 2018-10-06 NOTE — Unmapped (Signed)
[facility] Refill Coordination Note    Specialty Medication(s) to be Shipped:   Transplant: mycophenolate mofetil 250mg , Gengraf 25mg  and Gengraf 100mg      Andrew Diaz, DOB: 02/19/1956  Phone: [phone] (home)     All above HIPAA information was verified with patient.     Completed refill call assessment today to schedule patient's medication shipment from the [facility] [phone]).       Specialty medication(s) and dose(s) confirmed: Regimen is correct and unchanged.   Changes to medications: Andrew Diaz no changes reported at this time.  Changes to insurance: No  Questions for the pharmacist: No    Confirmed patient received Welcome Packet with first shipment. The patient will receive a drug information handout for each medication shipped and additional FDA Medication Guides as required.       DISEASE/MEDICATION-SPECIFIC INFORMATION        N/A    SPECIALTY MEDICATION ADHERENCE     Medication Adherence    Patient reported X missed doses in the last month:  0  Specialty Medication:  Gengraf 100mg   Patient is on additional specialty medications:  Yes  Additional Specialty Medications:  Gengraf 25mg   Patient Reported Additional Medication X Missed Doses in the Last Month:  0  Patient is on more than two specialty medications:  Yes  Specialty Medication:  Mycophenoalte 250mg   Patient Reported Additional Medication X Missed Doses in the Last Month:  0  Adherence tools used:  patient uses a pill box to manage medications  Support network for adherence:  family member        Mycophenolate 250 mg: 6 days of medicine on hand   Gengraf 25 mg: 6 days of medicine on hand   Gengraf 100 mg: 6 days of medicine on hand     SHIPPING     Shipping address confirmed in Epic.     Delivery Scheduled: Yes, Expected medication delivery date: 10/10/2018.     Medication will be delivered via UPS to the home address in [place].    [name] [name]   [facility] Specialty Technician

## 2018-10-09 MED FILL — MYCOPHENOLATE MOFETIL 250 MG CAPSULE: 30 days supply | Qty: 120 | Fill #5 | Status: AC

## 2018-10-09 MED FILL — GENGRAF 25 MG CAPSULE: 30 days supply | Qty: 180 | Fill #5 | Status: AC

## 2018-10-09 MED FILL — GENGRAF 25 MG CAPSULE: 30 days supply | Qty: 180 | Fill #5

## 2018-10-09 MED FILL — MYCOPHENOLATE MOFETIL 250 MG CAPSULE: 30 days supply | Qty: 120 | Fill #5

## 2018-10-09 MED FILL — GENGRAF 100 MG CAPSULE: 30 days supply | Qty: 60 | Fill #5 | Status: AC

## 2018-10-09 MED FILL — GENGRAF 100 MG CAPSULE: ORAL | 30 days supply | Qty: 60 | Fill #5

## 2018-11-10 NOTE — Unmapped (Signed)
[facility] Refill Coordination Note    Specialty Medication(s) to be Shipped:   Transplant: mycophenolate mofetil 250mg , Gengraf 25mg  and Gengraf 100mg      [name], DOB: 1955/11/03  Phone: [phone] (home)     All above HIPAA information was verified with patient.     Completed refill call assessment today to schedule patient's medication shipment from the [facility] [phone]).       Specialty medication(s) and dose(s) confirmed: Regimen is correct and unchanged.   Changes to medications: Dawit reports no changes reported at this time.  Changes to insurance: No  Questions for the pharmacist: No    Confirmed patient received Welcome Packet with first shipment. The patient will receive a drug information handout for each medication shipped and additional FDA Medication Guides as required.       DISEASE/MEDICATION-SPECIFIC INFORMATION        N/A    SPECIALTY MEDICATION ADHERENCE     Medication Adherence    Patient reported X missed doses in the last month:  0  Specialty Medication:  Gengraf 25mg   Patient is on additional specialty medications:  Yes  Additional Specialty Medications:  Gengraf 100mg   Patient Reported Additional Medication X Missed Doses in the Last Month:  0  Patient is on more than two specialty medications:  Yes  Specialty Medication:  Mycophenolate 250mg   Patient Reported Additional Medication X Missed Doses in the Last Month:  0  Adherence tools used:  patient uses a pill box to manage medications  Support network for adherence:  family member        Mycophenolate 250 mg: 7 days of medicine on hand   Gengraf 25 mg: 7 days of medicine on hand   Gengraf 100 mg: 7 days of medicine on hand     SHIPPING     Shipping address confirmed in Epic.     Delivery Scheduled: Yes, Expected medication delivery date: 11/16/2018.     Medication will be delivered via UPS to the home address in [place].    Demetruis Depaul P [name]   [facility] Shared [facility] Specialty Technician

## 2018-11-13 NOTE — Unmapped (Signed)
Left message with girlfriend, called to schedule follow up visit with Dr Margaretmary Bayley. Can be scheduled from recall tab.

## 2018-11-15 MED FILL — MYCOPHENOLATE MOFETIL 250 MG CAPSULE: 30 days supply | Qty: 120 | Fill #6

## 2018-11-15 MED FILL — GENGRAF 25 MG CAPSULE: 30 days supply | Qty: 180 | Fill #6

## 2018-11-15 MED FILL — GENGRAF 25 MG CAPSULE: 30 days supply | Qty: 180 | Fill #6 | Status: AC

## 2018-11-15 MED FILL — MYCOPHENOLATE MOFETIL 250 MG CAPSULE: 30 days supply | Qty: 120 | Fill #6 | Status: AC

## 2018-11-15 MED FILL — GENGRAF 100 MG CAPSULE: ORAL | 30 days supply | Qty: 60 | Fill #6

## 2018-11-15 MED FILL — GENGRAF 100 MG CAPSULE: 30 days supply | Qty: 60 | Fill #6 | Status: AC

## 2018-11-22 MED ORDER — SODIUM BICARBONATE 650 MG TABLET
ORAL_TABLET | Freq: Three times a day (TID) | ORAL | 3 refills | 60 days | Status: CP
Start: 2018-11-22 — End: 2019-11-23

## 2018-12-08 ENCOUNTER — Encounter: Admit: 2018-12-08 | Discharge: 2018-12-09 | Payer: MEDICARE | Attending: Nephrology | Primary: Nephrology

## 2018-12-08 ENCOUNTER — Encounter: Admit: 2018-12-08 | Discharge: 2018-12-09 | Payer: MEDICARE

## 2018-12-08 DIAGNOSIS — Z940 Kidney transplant status: Principal | ICD-10-CM

## 2018-12-08 DIAGNOSIS — D899 Disorder involving the immune mechanism, unspecified: Secondary | ICD-10-CM

## 2018-12-08 DIAGNOSIS — Z79899 Other long term (current) drug therapy: Secondary | ICD-10-CM

## 2018-12-08 LAB — BASIC METABOLIC PANEL
ANION GAP: 15 mmol/L (ref 7–15)
BUN / CREAT RATIO: 20
CALCIUM: 9.8 mg/dL (ref 8.5–10.2)
CHLORIDE: 103 mmol/L (ref 98–107)
CO2: 23 mmol/L (ref 22.0–30.0)
CREATININE: 2.36 mg/dL — ABNORMAL HIGH (ref 0.70–1.30)
EGFR CKD-EPI AA MALE: 32 mL/min/{1.73_m2} — ABNORMAL LOW (ref >=60)
EGFR CKD-EPI NON-AA MALE: 28 mL/min/{1.73_m2} — ABNORMAL LOW (ref >=60)
GLUCOSE RANDOM: 113 mg/dL (ref 70–179)
POTASSIUM: 5.2 mmol/L — ABNORMAL HIGH (ref 3.5–5.0)
SODIUM: 141 mmol/L (ref 135–145)

## 2018-12-08 LAB — CBC W/ AUTO DIFF
BASOPHILS ABSOLUTE COUNT: 0 10*9/L (ref 0.0–0.1)
BASOPHILS RELATIVE PERCENT: 0.4 %
EOSINOPHILS ABSOLUTE COUNT: 0.1 10*9/L (ref 0.0–0.4)
EOSINOPHILS RELATIVE PERCENT: 1.6 %
HEMATOCRIT: 39.5 % — ABNORMAL LOW (ref 41.0–53.0)
HEMOGLOBIN: 12.2 g/dL — ABNORMAL LOW (ref 13.5–17.5)
LARGE UNSTAINED CELLS: 2 % (ref 0–4)
LYMPHOCYTES ABSOLUTE COUNT: 0.7 10*9/L — ABNORMAL LOW (ref 1.5–5.0)
LYMPHOCYTES RELATIVE PERCENT: 15.5 %
MEAN CORPUSCULAR HEMOGLOBIN CONC: 30.8 g/dL — ABNORMAL LOW (ref 31.0–37.0)
MEAN CORPUSCULAR VOLUME: 81.8 fL (ref 80.0–100.0)
MEAN PLATELET VOLUME: 7.3 fL (ref 7.0–10.0)
MONOCYTES RELATIVE PERCENT: 9.3 %
NEUTROPHILS ABSOLUTE COUNT: 3.1 10*9/L (ref 2.0–7.5)
NEUTROPHILS RELATIVE PERCENT: 71.1 %
RED BLOOD CELL COUNT: 4.83 10*12/L (ref 4.50–5.90)
RED CELL DISTRIBUTION WIDTH: 15.5 % — ABNORMAL HIGH (ref 12.0–15.0)
WBC ADJUSTED: 4.3 10*9/L — ABNORMAL LOW (ref 4.5–11.0)

## 2018-12-08 LAB — URINALYSIS
BACTERIA: NONE SEEN /HPF
BILIRUBIN UA: NEGATIVE
GLUCOSE UA: NEGATIVE
HYALINE CASTS: 1 /LPF (ref 0–1)
KETONES UA: NEGATIVE
LEUKOCYTE ESTERASE UA: NEGATIVE
NITRITE UA: NEGATIVE
PH UA: 5 (ref 5.0–9.0)
PROTEIN UA: NEGATIVE
RBC UA: 2 /HPF (ref <=3)
SPECIFIC GRAVITY UA: 1.01 (ref 1.003–1.030)
SQUAMOUS EPITHELIAL: <1 /HPF (ref 0–5)
WBC UA: <1 /HPF (ref <=2)

## 2018-12-08 LAB — PROTEIN / CREATININE RATIO, URINE: PROTEIN URINE: 17.7 mg/dL

## 2018-12-08 LAB — PHOSPHORUS: Phosphate:MCnc:Pt:Ser/Plas:Qn:: 3.5

## 2018-12-08 LAB — MAGNESIUM: Magnesium:MCnc:Pt:Ser/Plas:Qn:: 1.6

## 2018-12-08 LAB — SMEAR REVIEW

## 2018-12-08 LAB — BACTERIA: Lab: NONE SEEN

## 2018-12-08 LAB — CREATININE, URINE: Lab: 73.2

## 2018-12-08 LAB — CYCLOSPORINE, TROUGH: Lab: 142

## 2018-12-08 LAB — MEAN PLATELET VOLUME: Lab: 7.3

## 2018-12-08 LAB — BLOOD UREA NITROGEN: Urea nitrogen:MCnc:Pt:Ser/Plas:Qn:: 48 — ABNORMAL HIGH

## 2018-12-08 NOTE — Unmapped (Signed)
Transplant Nephrology??Clinic??Visit  ??  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 He also has a history of a class I donor specific antibodies, but has no history of rejection or significant proteinuria with baseline serum creatinine 1.6-2.4 mg/dL.    Interval History:  He is without complaints today. He denies cough, shortness of breath,altered sense of smell or taste, fever, nausea, vomiting,diarrhea, dysuria, graft tenderness, or chest pain. Home BP's are in the 140-160 range systolic and the 16'X diastolic.  Blood glucoses have been stable per patient.     He states he has decreased energy but has been more active since staying home due to covid.  He is mowing yards at this time.  Patient encouraged to increase fluids when outside.  Patient states he does miss his midday dose of hydralazine and sodium bicarb when outside because he forgets.  Patient has lost 5 pounds since last visit.     Last dose of cyclosporine: 10:00pm    Review of Systems     All other systems are reviewed and are negative.  A 10 systems review was completed.  Patient denies falling in past year and feels steady on his feet without any assistance.     Medications    Current Outpatient Medications   Medication Sig Dispense Refill   ??? aspirin, buffered 81 mg Tab Take 81 mg by mouth. Frequency:QD   Dosage:81   MG  Instructions:  Note:Dose: 81MG      ??? cholecalciferol, vitamin D3, (VITAMIN D3) 2,000 unit cap Take 2,000 Units by mouth every other day.     ??? cycloSPORINE modified (NEORAL) 100 MG capsule TAKE 1 CAPSULE BY MOUTH TWICE DAILY 60 each 11   ??? cycloSPORINE modified (NEORAL) 25 MG capsule TAKE 3 CAPSULES (75MG ) BY MOUTH TWICE DAILY 180 each 11   ??? docusate sodium (COLACE) 100 MG capsule Take 100 mg by mouth two (2) times a day as needed. Frequency:BID   Dosage:100   MG  Instructions:  Note:Dose: 100MG      ??? enalapril (VASOTEC) 10 MG tablet Take 2 tablets (20 mg total) by mouth Two (2) times a day. 120 tablet 11   ??? furosemide (LASIX) 20 MG tablet TAKE 1 TABLET BY MOUTH DAILY AS NEEDED 90 tablet 3   ??? hydrALAZINE (APRESOLINE) 25 MG tablet TAKE 3 TABLETS (75 MG TOTAL) BY MOUTH THREE (3) TIMES A DAY. 810 tablet 2   ??? hydroCHLOROthiazide (HYDRODIURIL) 50 MG tablet TAKE 1 TABLET (50 MG TOTAL) BY MOUTH DAILY. 90 tablet 3   ??? metoprolol tartrate (LOPRESSOR) 50 MG tablet Take 1 tablet (50 mg total) by mouth Two (2) times a day. 180 tablet 3   ??? mycophenolate (CELLCEPT) 250 mg capsule TAKE 2 CAPSULES (500MG ) BY MOUTH TWICE DAILY 120 capsule 11   ??? omeprazole (PRILOSEC) 40 MG capsule Take 1 capsule (40 mg total) by mouth daily. TAKE 1 CAPSULE (40 MG TOTAL) BY MOUTH DAILY. 30 capsule 11   ??? sodium bicarbonate 650 mg tablet Take 1 tablet (650 mg total) by mouth Three (3) times a day. 180 tablet 3   ??? traMADoL (ULTRAM) 50 mg tablet TAKE 1 TABLET BY MOUTH EVERY 6 HOURS AS NEEDED FOR PAIN 92 tablet 1   ??? lancets Misc (E11.39); NPI: [id number]. Check blood sugar 3 times daily before  meals 100 each 11   ??? MEDICAL SUPPLY ITEM (E11.39); NPI: [id number]. Check blood sugar 3 times daily before meals 1 Package 0   ??? sodium polystyrene, SPS, with sorbitol 15-20 gram/60 mL Susp Take 15 Gm daily as needed  For elevated potassium level. (Patient not taking: Reported on 12/08/2018) 360 mL 2     No current facility-administered medications for this visit.        Physical Exam    BP 154/72 (BP Site: R Arm, BP Position: Sitting, BP Cuff Size: Medium)  - Pulse 64  - Temp 35.9 ??C (96.7 ??F) (Temporal)  - Wt 99.2 kg (218 lb 9.6 oz)  - BMI 28.05 kg/m??     General: Patient is a pleasant male in no apparent distress.  Eyes: Sclera anicteric.  Neck: Supple without LAD/JVD/bruits.  Lungs: Clear to auscultation bilaterally, no wheezes/rales/rhonchi.  Cardiovascular: Grade 1/6 systolic murmur  Abdomen: Soft, notender/nondistended. Positive bowel sounds. No hepatosplenomegaly, masses or bruits appreciated.  Extremities: Without edema, joints without evidence of synovitis  Skin: Without rash  Neurological: Grossly nonfocal.  Psychiatric: Mood and affect appropriate.      Laboratory Results    Recent Results (from the past 170 hour(s))   Cyclosporine, Trough    Collection Time: 12/08/18  8:22 AM   Result Value Ref Range    Cyclosporine, Trough 142 100 - 400 ng/mL   Basic Metabolic Panel    Collection Time: 12/08/18  8:22 AM   Result Value Ref Range    Sodium 141 135 - 145 mmol/L    Potassium 5.2 (H) 3.5 - 5.0 mmol/L    Chloride 103 98 - 107 mmol/L    CO2 23.0 22.0 - 30.0 mmol/L    Anion Gap 15 7 - 15 mmol/L    BUN 48 (H) 7 - 21 mg/dL    Creatinine 8.65 (H) 0.70 - 1.30 mg/dL    BUN/Creatinine Ratio 20     EGFR CKD-EPI Non-African American, Male 28 (L) >=60 mL/min/1.52m2    EGFR CKD-EPI African American, Male 32 (L) >=60 mL/min/1.15m2    Glucose 113 70 - 179 mg/dL    Calcium 9.8 8.5 - 78.4 mg/dL   Magnesium Level    Collection Time: 12/08/18  8:22 AM   Result Value Ref Range    Magnesium 1.6 1.6 - 2.2 mg/dL   Phosphorus Level    Collection Time: 12/08/18  8:22 AM   Result Value Ref Range    Phosphorus 3.5 2.9 - 4.7 mg/dL   CBC w/ Differential    Collection Time: 12/08/18  8:22 AM   Result Value Ref Range    WBC 4.3 (L) 4.5 - 11.0 10*9/L    RBC 4.83 4.50 - 5.90 10*12/L    HGB 12.2 (L) 13.5 - 17.5 g/dL    HCT 69.6 (L) 29.5 - 53.0 %    MCV 81.8 80.0 - 100.0 fL    MCH 25.2 (L) 26.0 - 34.0 pg    MCHC 30.8 (L) 31.0 - 37.0 g/dL    RDW 28.4 (H) 13.2 - 15.0 %    MPV 7.3 7.0 - 10.0 fL    Platelet 207 150 - 440 10*9/L    Neutrophils % 71.1 %    Lymphocytes % 15.5 %    Monocytes % 9.3 %    Eosinophils % 1.6 %    Basophils % 0.4 %    Absolute Neutrophils 3.1 2.0 - 7.5 10*9/L    Absolute Lymphocytes 0.7 (L) 1.5 - 5.0  10*9/L    Absolute Monocytes 0.4 0.2 - 0.8 10*9/L    Absolute Eosinophils 0.1 0.0 - 0.4 10*9/L Absolute Basophils 0.0 0.0 - 0.1 10*9/L    Large Unstained Cells 2 0 - 4 %    Microcytosis Slight (A) Not Present    Hypochromasia Marked (A) Not Present   Morphology Review    Collection Time: 12/08/18  8:22 AM   Result Value Ref Range    Smear Review Comments See Comment (A) Undefined   Urinalysis    Collection Time: 12/08/18  9:04 AM   Result Value Ref Range    Color, UA Light Yellow     Clarity, UA Clear     Specific Gravity, UA 1.010 1.003 - 1.030    pH, UA 5.0 5.0 - 9.0    Leukocyte Esterase, UA Negative Negative    Nitrite, UA Negative Negative    Protein, UA Negative Negative    Glucose, UA Negative Negative    Ketones, UA Negative Negative    Urobilinogen, UA 0.2 mg/dL 0.2 mg/dL, 1.0 mg/dL    Bilirubin, UA Negative Negative    Blood, UA Negative Negative    RBC, UA 2 <=3 /HPF    WBC, UA <1 <=2 /HPF    Squam Epithel, UA <1 0 - 5 /HPF    Bacteria, UA None Seen None Seen /HPF    Hyaline Casts, UA 1 0 - 1 /LPF    Mucus, UA Rare (A) None Seen /HPF        Assessment and Plan    Faolan Springfield is a 63 y.o. 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 Active medical issues include:    1. 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 Though he has class I DSAs he has no history of rejection. Serum creatinine is 2.36 today. This is within his baseline range of 1.7-2.4 mg/dL. >25 decoy cells in urine on 04/05/2018.  BK viral load pending today.  CMV VL <50 on 04/05/2018, today's is pending.     2. Immunosuppression management. We will continue the current immunosuppressive regimen targeting cyclosporine levels of approximately 75-125. Today's level is 142 and is a 10.5 hour trough. He will continue cyclosporine 175 mg BID and CellCept 500 mg BID.     3. Hypertension. Systolic BP has been above target range despite a multi-drug regimen. Patient reminded to take midday dose of Hydralazine.    4. History of UTI. He has no current symptoms of UTI.     5. Hyperlipidemia. He was intolerant of Pravachol due to myalgias experienced with all statins.     6. 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 Continue sodium bicarbonate 650 mg TID and HCTZ for K control. Will continue ACE-I unless K consistently >6; he has prn Kayexylate if needed for hyperkalemia that needs medical management.    7. Diabetes mellitus, controlled. Will continue current therapy.     8. Cancer screens. Patient had a colonoscopy revealing no polyps in 2016, follow up 2021. Renal US today revealed no masses. Follows with urology for prostate scans.     9. Immunizations. Patient has received both doses of Shingrix (10/2016, 04/2017). Other vaccines are up-to-date.     10. Follow-up.  3 months with annual chest x-ray.  Reminded patient to get labs monthly to at least every 3 months.

## 2018-12-12 LAB — CMV DNA, QUANTITATIVE, PCR: CMV VIRAL LD: NOT DETECTED

## 2018-12-12 LAB — CMV COMMENT: Lab: 0

## 2018-12-13 LAB — BK VIRUS QUANTITATIVE PCR, BLOOD: BK BLOOD RESULT: NOT DETECTED

## 2018-12-13 LAB — BK BLOOD COMMENT: Lab: 0

## 2018-12-14 LAB — HLA DS POST TRANSPLANT
ANTI-DONOR DRW #1 MFI: 333 MFI
ANTI-DONOR HLA-A #1 MFI: 1752 MFI — ABNORMAL HIGH
ANTI-DONOR HLA-A #2 MFI: 143 MFI
ANTI-DONOR HLA-B #1 MFI: 5106 MFI — ABNORMAL HIGH
ANTI-DONOR HLA-B #2 MFI: 157 MFI
ANTI-DONOR HLA-DQB #1 MFI: 270 MFI
ANTI-DONOR HLA-DR #1 MFI: 118 MFI
ANTI-DONOR HLA-DR #2 MFI: 200 MFI

## 2018-12-14 LAB — HLA CLASS 1 ANTIBODY RESULT: Lab: POSITIVE

## 2018-12-14 LAB — FSAB CLASS 1 ANTIBODY SPECIFICITY: HLA CLASS 1 ANTIBODY RESULT: POSITIVE

## 2018-12-14 LAB — HLA CL2 AB RESULT: Lab: POSITIVE

## 2018-12-14 LAB — ANTI-DONOR HLA-B #1 MFI: Lab: 5106 — ABNORMAL HIGH

## 2018-12-15 MED FILL — GENGRAF 25 MG CAPSULE: 30 days supply | Qty: 180 | Fill #7 | Status: AC

## 2018-12-15 MED FILL — MYCOPHENOLATE MOFETIL 250 MG CAPSULE: 30 days supply | Qty: 120 | Fill #7 | Status: AC

## 2018-12-15 MED FILL — GENGRAF 100 MG CAPSULE: ORAL | 30 days supply | Qty: 60 | Fill #7

## 2018-12-15 MED FILL — GENGRAF 100 MG CAPSULE: 30 days supply | Qty: 60 | Fill #7 | Status: AC

## 2018-12-15 MED FILL — GENGRAF 25 MG CAPSULE: 30 days supply | Qty: 180 | Fill #7

## 2018-12-15 MED FILL — MYCOPHENOLATE MOFETIL 250 MG CAPSULE: 30 days supply | Qty: 120 | Fill #7

## 2018-12-15 NOTE — Unmapped (Signed)
Andrew Diaz    Specialty Medication(s) to be Shipped:   Transplant: mycophenolate mofetil 250mg , Gengraf 25mg  and Gengraf 100mg      Andrew Diaz, DOB: [birth year]  Phone: [phone] (home)     All above HIPAA information was verified with patient.     Completed refill call assessment today to schedule patient's medication shipment from the [facility] [phone]).       Specialty medication(s) and dose(s) confirmed: Regimen is correct and unchanged.   Changes to medications: Andrew Diaz reports no changes at this time.  Changes to insurance: No  Questions for the pharmacist: No    Confirmed patient received Welcome Packet with first shipment. The patient will receive a drug information handout for each medication shipped and additional FDA Medication Guides as required.       DISEASE/MEDICATION-SPECIFIC INFORMATION        N/A    SPECIALTY MEDICATION ADHERENCE     Medication Adherence    Patient reported X missed doses in the last month: 0  Specialty Medication: Gengraf 25mg   Patient is on additional specialty medications: Yes  Additional Specialty Medications: Gengraf 100mg   Patient Reported Additional Medication X Missed Doses in the Last Month: 0  Patient is on more than two specialty medications: Yes  Specialty Medication: Mycophenolate 250mg   Patient Reported Additional Medication X Missed Doses in the Last Month: 0  Adherence tools used: patient uses a pill box to manage medications  Support network for adherence: family member        Gengraf 25 mg: 5 days of medicine on hand   Gengraf 100 mg: 5 days of medicine on hand   Mycophenolate 250 mg: 5 days of medicine on hand     SHIPPING     Shipping address confirmed in Epic.     Delivery Scheduled: Yes, Expected medication delivery date: 12/18/2018.     Medication will be delivered via UPS to the home address in [employer] [place].    Andrew Diaz   [facility] Shared [facility] Specialty Technician

## 2018-12-19 MED ORDER — FUROSEMIDE 20 MG TABLET
ORAL_TABLET | Freq: Every day | ORAL | 3 refills | 90.00000 days | Status: CP
Start: 2018-12-19 — End: ?

## 2018-12-28 MED ORDER — HYDRALAZINE 25 MG TABLET
ORAL_TABLET | Freq: Three times a day (TID) | ORAL | 2 refills | 90 days | Status: CP
Start: 2018-12-28 — End: ?

## 2019-01-02 DIAGNOSIS — I10 Essential (primary) hypertension: Secondary | ICD-10-CM

## 2019-01-02 DIAGNOSIS — Z940 Kidney transplant status: Secondary | ICD-10-CM

## 2019-01-02 MED ORDER — HYDROCHLOROTHIAZIDE 50 MG TABLET
ORAL_TABLET | Freq: Every day | ORAL | 3 refills | 90 days | Status: CP
Start: 2019-01-02 — End: 2020-01-03

## 2019-01-04 ENCOUNTER — Encounter: Admit: 2019-01-04 | Discharge: 2019-01-05 | Payer: MEDICARE

## 2019-01-04 DIAGNOSIS — E113593 Type 2 diabetes mellitus with proliferative diabetic retinopathy without macular edema, bilateral: Secondary | ICD-10-CM

## 2019-01-04 DIAGNOSIS — H43819 Vitreous degeneration, unspecified eye: Secondary | ICD-10-CM

## 2019-01-04 DIAGNOSIS — H26492 Other secondary cataract, left eye: Secondary | ICD-10-CM

## 2019-01-04 DIAGNOSIS — H04123 Dry eye syndrome of bilateral lacrimal glands: Secondary | ICD-10-CM

## 2019-01-04 DIAGNOSIS — E11311 Type 2 diabetes mellitus with unspecified diabetic retinopathy with macular edema: Secondary | ICD-10-CM

## 2019-01-04 DIAGNOSIS — H4311 Vitreous hemorrhage, right eye: Secondary | ICD-10-CM

## 2019-01-04 NOTE — Unmapped (Signed)
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 No medications taken for Diabetes s/p kidney transplant in 2008.   --PRP OD 2004, #387 (02/09/11), #8119 (10/14/11)   --PRP OS 2004, 2013 with PPV    - patient reports no longer needing DM meds after kidney transplant  -  old VH today in OD. Good PRP The patient will observe these symptoms, and report promptly any worsening or unexpected persistence.  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      - PCO OS - sp YAG  Capsulotomy August 2016.       4) Dry eye symptoms.     INTERPRETATION EXTENDED OPHTHALMOSCOPT MACULA (90Dlens)  Macula - right:  Flat  Macula - left:  flat,  MA's, IRH temporally, ERM

## 2019-01-15 NOTE — Unmapped (Signed)
[facility] Clinical Assessment & Refill Coordination Note    Andrew Diaz, [place]: 1956/03/06  Phone: [phone] (home)     All above HIPAA information was verified with patient.     Specialty Medication(s):   Transplant: mycophenolate mofetil 250 gengraf 100mg mg, gengraf 25mg  and n/a     Current Outpatient Medications   Medication Sig Dispense Refill   ??? aspirin, buffered 81 mg Tab Take 81 mg by mouth. Frequency:QD   Dosage:81   MG  Instructions:  Note:Dose: 81MG      ??? cholecalciferol, vitamin D3, (VITAMIN D3) 2,000 unit cap Take 2,000 Units by mouth every other day.     ??? cycloSPORINE modified (NEORAL) 100 MG capsule TAKE 1 CAPSULE BY MOUTH TWICE DAILY 60 each 11   ??? cycloSPORINE modified (NEORAL) 25 MG capsule TAKE 3 CAPSULES (75MG ) BY MOUTH TWICE DAILY 180 each 11   ??? docusate sodium (COLACE) 100 MG capsule Take 100 mg by mouth two (2) times a day as needed. Frequency:BID   Dosage:100   MG  Instructions:  Note:Dose: 100MG      ??? enalapril (VASOTEC) 10 MG tablet Take 2 tablets (20 mg total) by mouth Two (2) times a day. 120 tablet 11   ??? furosemide (LASIX) 20 MG tablet Take 1 tablet (20 mg total) by mouth daily. 90 tablet 3   ??? hydrALAZINE (APRESOLINE) 25 MG tablet Take 3 tablets (75 mg total) by mouth Three (3) times a day. 810 tablet 2   ??? hydroCHLOROthiazide (HYDRODIURIL) 50 MG tablet Take 1 tablet (50 mg total) by mouth daily. 90 tablet 3   ??? lancets Misc (E11.39); NPI: [id number]. Check blood sugar 3 times daily before meals 100 each 11   ??? MEDICAL SUPPLY ITEM (E11.39); NPI: [id number]. Check blood sugar 3 times daily before meals 1 Package 0   ??? metoprolol tartrate (LOPRESSOR) 50 MG tablet Take 1 tablet (50 mg total) by mouth Two (2) times a day. 180 tablet 3   ??? mycophenolate (CELLCEPT) 250 mg capsule TAKE 2 CAPSULES (500MG ) BY MOUTH TWICE DAILY 120 capsule 11   ??? omeprazole (PRILOSEC) 40 MG capsule Take 1 capsule (40 mg total) by mouth daily. TAKE 1 CAPSULE (40 MG TOTAL) BY MOUTH DAILY. 30 capsule 11   ??? sodium bicarbonate 650 mg tablet Take 1 tablet (650 mg total) by mouth Three (3) times a day. 180 tablet 3   ??? sodium polystyrene, SPS, with sorbitol 15-20 gram/60 mL Susp Take 15 Gm daily as needed  For elevated potassium level. 360 mL 2   ??? traMADoL (ULTRAM) 50 mg tablet TAKE 1 TABLET BY MOUTH EVERY 6 HOURS AS NEEDED FOR PAIN 92 tablet 1     No current facility-administered medications for this visit.         Changes to medications: Roarke reports no changes at this time.    Allergies   Allergen Reactions   ??? Pollen Extracts Other (See Comments)     cough       Changes to allergies: No    SPECIALTY MEDICATION ADHERENCE     gengraf 100mg   : 10 days of medicine on hand   gengraf 25mg   : 10 days of medicine on hand   Mycophenolate 250mg   : 8 days of medicine on hand     Medication Adherence    Patient reported X missed doses in the last month: 0  Specialty Medication: gengraf 100mg   Patient is on additional specialty medications: Yes  Additional  Specialty Medications: gengraf 25mg   Patient Reported Additional Medication X Missed Doses in the Last Month: 0  Patient is on more than two specialty medications: Yes  Specialty Medication: mycophenolate 250mg   Patient Reported Additional Medication X Missed Doses in the Last Month: 0  Adherence tools used: patient uses a pill box to manage medications  Support network for adherence: family member          Specialty medication(s) dose(s) confirmed: Regimen is correct and unchanged.     Are there any concerns with adherence? No    Adherence counseling provided? Not needed    CLINICAL MANAGEMENT AND INTERVENTION      Clinical Benefit Assessment:    Do you feel the medicine is effective or helping your condition? Yes    Clinical Benefit counseling provided? Not needed    Adverse Effects Assessment:    Are you experiencing any side effects? No    Are you experiencing difficulty administering your medicine? No    Quality of Life Assessment: How many days over the past month did your transplant  keep you from your normal activities? For example, brushing your teeth or getting up in the morning. 0    Have you discussed this with your provider? Not needed    Therapy Appropriateness:    Is therapy appropriate? Yes, therapy is appropriate and should be continued    DISEASE/MEDICATION-SPECIFIC INFORMATION      N/A    PATIENT SPECIFIC NEEDS     ? Does the patient have any physical, cognitive, or cultural barriers? No    ? Is the patient high risk? Yes, patient taking a REMS drug     ? Does the patient require a Care Management Plan? No     ? Does the patient require physician intervention or other additional services (i.e. nutrition, smoking cessation, social work)? No      SHIPPING     Specialty Medication(s) to be Shipped:   Transplant: mycophenolate mofetil 250mg , gengraf 100mg  and gengraf 25mg     Other medication(s) to be shipped: n/a     Changes to insurance: No    Delivery Scheduled: Yes, Expected medication delivery date: 01/18/2019.     Medication will be delivered via UPS to the confirmed home address in [facility].    The patient will receive a drug information handout for each medication shipped and additional FDA Medication Guides as required.  Verified that patient has previously received a Conservation officer, historic buildings.    All of the patient's questions and concerns have been addressed.    Thad Ranger   [facility] Specialty Pharmacist

## 2019-01-18 NOTE — Unmapped (Signed)
Andrew Diaz 's Gengraf shipment will be delayed as a result of insufficient inventory of the drug.     I have reached out to the patient and communicated the delivery change. We will reschedule the medication for the delivery date that the patient agreed upon.  We have confirmed the delivery date as 01/19/2019, via same day courier. DMD

## 2019-01-19 MED FILL — MYCOPHENOLATE MOFETIL 250 MG CAPSULE: 30 days supply | Qty: 120 | Fill #8 | Status: AC

## 2019-01-19 MED FILL — GENGRAF 25 MG CAPSULE: 30 days supply | Qty: 180 | Fill #8 | Status: AC

## 2019-01-19 MED FILL — MYCOPHENOLATE MOFETIL 250 MG CAPSULE: 30 days supply | Qty: 120 | Fill #8

## 2019-01-19 MED FILL — GENGRAF 25 MG CAPSULE: 30 days supply | Qty: 180 | Fill #8

## 2019-01-19 MED FILL — GENGRAF 100 MG CAPSULE: 30 days supply | Qty: 60 | Fill #8 | Status: AC

## 2019-01-19 MED FILL — GENGRAF 100 MG CAPSULE: ORAL | 30 days supply | Qty: 60 | Fill #8

## 2019-02-12 NOTE — Unmapped (Signed)
[facility] Refill Coordination Note    Specialty Medication(s) to be Shipped:   Transplant: mycophenolate mofetil 250mg , Gengraf 25mg  and Gengraf 100mg     Other medication(s) to be shipped: N/A     Andrew Diaz, DOB: June 18, 1955  Phone: [phone] (home)       All above HIPAA information was verified with patient.     Completed refill call assessment today to schedule patient's medication shipment from the [facility] [phone]).       Specialty medication(s) and dose(s) confirmed: Regimen is correct and unchanged.   Changes to medications: [name] reports no changes at this time.  Changes to insurance: No  Questions for the pharmacist: No    Confirmed patient received Welcome Packet with first shipment. The patient will receive a drug information handout for each medication shipped and additional FDA Medication Guides as required.       DISEASE/MEDICATION-SPECIFIC INFORMATION        N/A    SPECIALTY MEDICATION ADHERENCE     Medication Adherence    Patient reported X missed doses in the last month: 0  Specialty Medication: Gengraf 25mg   Patient is on additional specialty medications: Yes  Additional Specialty Medications: Gengraf 100mg   Patient Reported Additional Medication X Missed Doses in the Last Month: 0  Patient is on more than two specialty medications: Yes  Specialty Medication: Mycophenolate 250mg   Patient Reported Additional Medication X Missed Doses in the Last Month: 0  Adherence tools used: patient uses a pill box to manage medications  Support network for adherence: family member          Gengraf 25 mg: 7 days of medicine on hand   Gengraf 100 mg: 7 days of medicine on hand   Mycophenolate 250 mg: 7 days of medicine on hand     SHIPPING     Shipping address confirmed in Epic.     Delivery Scheduled: Yes, Expected medication delivery date: 02/16/2019.     Medication will be delivered via UPS to the home address in [employer] [place].    [name] [facility] Specialty Technician

## 2019-02-15 MED FILL — GENGRAF 25 MG CAPSULE: 30 days supply | Qty: 180 | Fill #9

## 2019-02-15 MED FILL — MYCOPHENOLATE MOFETIL 250 MG CAPSULE: 30 days supply | Qty: 120 | Fill #9 | Status: AC

## 2019-02-15 MED FILL — GENGRAF 100 MG CAPSULE: ORAL | 30 days supply | Qty: 60 | Fill #9

## 2019-02-15 MED FILL — MYCOPHENOLATE MOFETIL 250 MG CAPSULE: 30 days supply | Qty: 120 | Fill #9

## 2019-02-15 MED FILL — GENGRAF 25 MG CAPSULE: 30 days supply | Qty: 180 | Fill #9 | Status: AC

## 2019-02-15 MED FILL — GENGRAF 100 MG CAPSULE: 30 days supply | Qty: 60 | Fill #9 | Status: AC

## 2019-02-26 DIAGNOSIS — Z940 Kidney transplant status: Principal | ICD-10-CM

## 2019-02-26 MED ORDER — TRAMADOL 50 MG TABLET
ORAL_TABLET | 0 refills | 0 days | Status: CP
Start: 2019-02-26 — End: ?

## 2019-03-07 NOTE — Unmapped (Signed)
[facility] Refill Coordination Note    Specialty Medication(s) to be Shipped:   Transplant: mycophenolate mofetil 250mg , Gengraf 25mg  and Gengraf 100mg     Other medication(s) to be shipped: none     [name], DOB: 07/27/1955  Phone: [phone] (home)       All above HIPAA information was verified with patient.     Completed refill call assessment today to schedule patient's medication shipment from the [facility] [phone]).       Specialty medication(s) and dose(s) confirmed: Regimen is correct and unchanged.   Changes to medications: Jerick reports no changes at this time.  Changes to insurance: No  Questions for the pharmacist: No    Confirmed patient received Welcome Packet with first shipment. The patient will receive a drug information handout for each medication shipped and additional FDA Medication Guides as required.       DISEASE/MEDICATION-SPECIFIC INFORMATION        N/A    SPECIALTY MEDICATION ADHERENCE     Medication Adherence    Patient reported X missed doses in the last month: 0  Specialty Medication: Gengraf 25mg  and 100mg   Patient is on additional specialty medications: Yes  Additional Specialty Medications: Mycophenolate 250mg   Patient Reported Additional Medication X Missed Doses in the Last Month: 0  Adherence tools used: patient uses a pill box to manage medications  Support network for adherence: family member                Gengraf 25 mg: 7 days of medicine on hand   Gengraf 100 mg: 7 days of medicine on hand   Mycophenolate 250 mg: 7 days of medicine on hand       SHIPPING     Shipping address confirmed in Epic.     Delivery Scheduled: Yes, Expected medication delivery date: 03/14/2019.     Medication will be delivered via UPS to the prescription address in [facility] [place].    Tera Helper   [facility] Specialty Pharmacist

## 2019-03-13 MED FILL — MYCOPHENOLATE MOFETIL 250 MG CAPSULE: 30 days supply | Qty: 120 | Fill #10 | Status: AC

## 2019-03-13 MED FILL — MYCOPHENOLATE MOFETIL 250 MG CAPSULE: 30 days supply | Qty: 120 | Fill #10

## 2019-03-13 MED FILL — GENGRAF 25 MG CAPSULE: 30 days supply | Qty: 180 | Fill #10 | Status: AC

## 2019-03-13 MED FILL — GENGRAF 100 MG CAPSULE: ORAL | 30 days supply | Qty: 60 | Fill #10

## 2019-03-13 MED FILL — GENGRAF 25 MG CAPSULE: 30 days supply | Qty: 180 | Fill #10

## 2019-03-13 MED FILL — GENGRAF 100 MG CAPSULE: 30 days supply | Qty: 60 | Fill #10 | Status: AC

## 2019-04-06 NOTE — Unmapped (Signed)
[facility] Refill Coordination Note    Specialty Medication(s) to be Shipped:   Transplant: mycophenolate mofetil 250mg , GENGRAF 25mg  and GENGRAF 100mg     Other medication(s) to be shipped: none     [name], DOB: 10/04/1955  Phone: [phone] (home)       All above HIPAA information was verified with patient.     Was a Nurse, learning disability used for this call? No    Completed refill call assessment today to schedule patient's medication shipment from the [facility] [phone]).       Specialty medication(s) and dose(s) confirmed: Regimen is correct and unchanged.   Changes to medications: Divon reports no changes at this time.  Changes to insurance: No  Questions for the pharmacist: No    Confirmed patient received Welcome Packet with first shipment. The patient will receive a drug information handout for each medication shipped and additional FDA Medication Guides as required.       DISEASE/MEDICATION-SPECIFIC INFORMATION        N/A    SPECIALTY MEDICATION ADHERENCE     Medication Adherence    Patient reported X missed doses in the last month: 0  Adherence tools used: patient uses a pill box to manage medications  Support network for adherence: family member          Gengraf 25 mg: 8 days of medicine on hand   Gengraf 100 mg: 8 days of medicine on hand   Mycophenolate 250 mg: 8 days of medicine on hand         SHIPPING     Shipping address confirmed in Epic.     Delivery Scheduled: Yes, Expected medication delivery date: 04/11/19.     Medication will be delivered via UPS to the prescription address in [facility] [place].    Andrew Diaz   [facility] Specialty Technician

## 2019-04-10 MED FILL — GENGRAF 25 MG CAPSULE: 30 days supply | Qty: 180 | Fill #11 | Status: AC

## 2019-04-10 MED FILL — MYCOPHENOLATE MOFETIL 250 MG CAPSULE: 30 days supply | Qty: 120 | Fill #11

## 2019-04-10 MED FILL — GENGRAF 25 MG CAPSULE: 30 days supply | Qty: 180 | Fill #11

## 2019-04-10 MED FILL — GENGRAF 100 MG CAPSULE: ORAL | 30 days supply | Qty: 60 | Fill #11

## 2019-04-10 MED FILL — MYCOPHENOLATE MOFETIL 250 MG CAPSULE: 30 days supply | Qty: 120 | Fill #11 | Status: AC

## 2019-04-10 MED FILL — GENGRAF 100 MG CAPSULE: 30 days supply | Qty: 60 | Fill #11 | Status: AC

## 2019-05-03 ENCOUNTER — Encounter: Admit: 2019-05-03 | Discharge: 2019-05-03 | Payer: MEDICARE

## 2019-05-03 ENCOUNTER — Encounter: Admit: 2019-05-03 | Discharge: 2019-05-03 | Payer: MEDICARE | Attending: Nephrology | Primary: Nephrology

## 2019-05-03 DIAGNOSIS — I10 Essential (primary) hypertension: Principal | ICD-10-CM

## 2019-05-03 DIAGNOSIS — Z940 Kidney transplant status: Principal | ICD-10-CM

## 2019-05-03 DIAGNOSIS — D899 Disorder involving the immune mechanism, unspecified: Principal | ICD-10-CM

## 2019-05-03 DIAGNOSIS — Z48298 Encounter for aftercare following other organ transplant: Principal | ICD-10-CM

## 2019-05-03 DIAGNOSIS — N186 End stage renal disease: Principal | ICD-10-CM

## 2019-05-03 LAB — KETONES UA: Ketones:MCnc:Pt:Urine:Qn:Test strip: NEGATIVE

## 2019-05-03 LAB — CBC W/ AUTO DIFF
BASOPHILS ABSOLUTE COUNT: 0 10*9/L (ref 0.0–0.1)
BASOPHILS RELATIVE PERCENT: 0.7 %
EOSINOPHILS ABSOLUTE COUNT: 0.1 10*9/L (ref 0.0–0.4)
EOSINOPHILS RELATIVE PERCENT: 2.4 %
HEMOGLOBIN: 12.3 g/dL — ABNORMAL LOW (ref 13.5–17.5)
LARGE UNSTAINED CELLS: 2 % (ref 0–4)
LYMPHOCYTES ABSOLUTE COUNT: 0.9 10*9/L — ABNORMAL LOW (ref 1.5–5.0)
LYMPHOCYTES RELATIVE PERCENT: 24.5 %
MEAN CORPUSCULAR HEMOGLOBIN CONC: 30.1 g/dL — ABNORMAL LOW (ref 31.0–37.0)
MEAN CORPUSCULAR VOLUME: 83.8 fL (ref 80.0–100.0)
MEAN PLATELET VOLUME: 8.7 fL (ref 7.0–10.0)
MONOCYTES RELATIVE PERCENT: 9.1 %
NEUTROPHILS ABSOLUTE COUNT: 2.2 10*9/L (ref 2.0–7.5)
NEUTROPHILS RELATIVE PERCENT: 61.3 %
RED BLOOD CELL COUNT: 4.89 10*12/L (ref 4.50–5.90)
RED CELL DISTRIBUTION WIDTH: 16.5 % — ABNORMAL HIGH (ref 12.0–15.0)
WBC ADJUSTED: 3.6 10*9/L — ABNORMAL LOW (ref 4.5–11.0)

## 2019-05-03 LAB — URINALYSIS
BACTERIA: NONE SEEN /HPF
BILIRUBIN UA: NEGATIVE
BLOOD UA: NEGATIVE
GLUCOSE UA: NEGATIVE
HYALINE CASTS: 1 /LPF (ref 0–1)
KETONES UA: NEGATIVE
LEUKOCYTE ESTERASE UA: NEGATIVE
PH UA: 5 (ref 5.0–9.0)
PROTEIN UA: NEGATIVE
RBC UA: 1 /HPF (ref <=3)
SPECIFIC GRAVITY UA: 1.009 (ref 1.003–1.030)
SQUAMOUS EPITHELIAL: 1 /HPF (ref 0–5)
UROBILINOGEN UA: 0.2
WBC UA: <1 /HPF (ref <=2)

## 2019-05-03 LAB — BASIC METABOLIC PANEL
ANION GAP: 8 mmol/L (ref 7–15)
BLOOD UREA NITROGEN: 52 mg/dL — ABNORMAL HIGH (ref 7–21)
BUN / CREAT RATIO: 20
CALCIUM: 9.7 mg/dL (ref 8.5–10.2)
CHLORIDE: 110 mmol/L — ABNORMAL HIGH (ref 98–107)
CO2: 20 mmol/L — ABNORMAL LOW (ref 22.0–30.0)
EGFR CKD-EPI NON-AA MALE: 25 mL/min/{1.73_m2} — ABNORMAL LOW (ref >=60)
GLUCOSE RANDOM: 97 mg/dL (ref 70–99)
POTASSIUM: 6.1 mmol/L (ref 3.5–5.0)
SODIUM: 138 mmol/L (ref 135–145)

## 2019-05-03 LAB — LIPID PANEL
CHOLESTEROL/HDL RATIO SCREEN: 4.5 (ref <5.0)
CHOLESTEROL: 218 mg/dL — ABNORMAL HIGH (ref 100–199)
HDL CHOLESTEROL: 48 mg/dL (ref 40–59)
LDL CHOLESTEROL CALCULATED: 143 mg/dL — ABNORMAL HIGH (ref 60–99)
NON-HDL CHOLESTEROL: 170 mg/dL
VLDL CHOLESTEROL CAL: 26.6 mg/dL (ref 12–42)

## 2019-05-03 LAB — PHOSPHORUS: Phosphate:MCnc:Pt:Ser/Plas:Qn:: 4

## 2019-05-03 LAB — SMEAR REVIEW

## 2019-05-03 LAB — PROTEIN / CREATININE RATIO, URINE: PROTEIN URINE: 5.6 mg/dL

## 2019-05-03 LAB — LDL CHOLESTEROL CALCULATED: Cholesterol.in LDL:MCnc:Pt:Ser/Plas:Qn:Calculated: 143 — ABNORMAL HIGH

## 2019-05-03 LAB — CYCLOSPORINE, TROUGH: Lab: 77 — ABNORMAL LOW

## 2019-05-03 LAB — MAGNESIUM: Magnesium:MCnc:Pt:Ser/Plas:Qn:: 1.4 — ABNORMAL LOW

## 2019-05-03 LAB — PROTEIN/CREAT RATIO, URINE: Protein/Creatinine:MRto:Pt:Urine:Qn:: 0.093

## 2019-05-03 LAB — BUN / CREAT RATIO: Urea nitrogen/Creatinine:MRto:Pt:Ser/Plas:Qn:: 20

## 2019-05-03 LAB — PARATHYROID HOMONE (PTH): CALCIUM: 9.7 mg/dL (ref 8.5–10.2)

## 2019-05-03 LAB — PARATHYROID HORMONE INTACT: Parathyrin.intact:MCnc:Pt:Ser/Plas:Qn:: 163.2 — ABNORMAL HIGH

## 2019-05-03 LAB — NEUTROPHILS RELATIVE PERCENT: Neutrophils/100 leukocytes:NFr:Pt:Bld:Qn:Automated count: 61.3

## 2019-05-03 NOTE — Unmapped (Signed)
Transplant Nephrology??Clinic??Visit  ??    Assessment and Plan    Andrew Diaz is a 63 y.o. 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 Active medical issues include:    1. 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  Serum creatinine is 2.60 today (baseline range 1.9-2.6 mg/dL).Though he has class I DSAs he has no history of rejection. DSA to HLA A1 (MFI 1752) and HLA B8 (MFI 5106) were shown most recently 12/08/18.  BK viral load was undetectable 12/08/18.  UP/C is pending but has been <1.0 but often >0.25. I do not feel a kidney biopsy is presently warranted.    2. Immunosuppression management. We will continue the current immunosuppressive regimen targeting cyclosporine levels of approximately 75-125. Today's level is pending. He will continue cyclosporine 175 mg BID and CellCept 500 mg BID.     3. Hypertension. Systolic BP is again elevated despite a multi-drug regimen. Home BP, however, is under better control. I will continue current therapy.     4. History of UTI. He has no current symptoms of UTI.     5. Hyperlipidemia. He was intolerant of Pravachol due to myalgias experienced with all statins.     6. 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 K today is 6.1. Will continue sodium bicarbonate 650 mg TID and hydrochlorothiazide and furosemide for K control. Will continue ACE-I and use prn Kayexylate if needed for hyperkalemia    7. Diabetes mellitus, controlled. Will continue current therapy.     8. Cancer screens. Patient had a colonoscopy revealing no polyps in 2016, follow up 2021. Renal US 12/08/27 revealed no masses. Follows with urology for prostate scans.     9. Immunizations. Patient has received both doses of Shingrix (10/2016, 04/2017). Other vaccines are up-to-date. He wishes to receive COVID vaccination when available.     10. Follow-up. He will be seen again in 6 months.     History of Present Illness Transplant History:  Andrew Diaz is 63 y.o.and received a DCD kidney transplant on 11/12/2006. 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 He also has a history of a class I donor specific antibodies, but has no history of rejection or significant proteinuria with baseline serum creatinine 1.6-2.4 mg/dL.    Interval History:  He is without complaints today. He denies dysuria or allograft tenderness. Edema in the LEs is unchanged. He denies cough, shortness of breath,altered sense of smell or taste, fever, nausea, vomiting,diarrhea, dysuria, graft tenderness, or chest pain. Home BP's are in the 140-160 range systolic and the 45'W diastolic.  Blood glucoses have not been checked consistently.      He had chest pain approximately 3 weeks ago that lasted most of the day. It is described as heartburn and has not recurred.  He denies contact with COVID-19 people and has been practicing recommended infection prevention methods. He denies cough, shortness of breath, fever, chills, loss of taste or smell, nausea, vomiting, diarrhea, sore throat or nasal congestion.     Last dose of cyclosporine: 7 PM    Review of Systems     All other systems are reviewed and are negative.  A 10 systems review was completed.  Patient denies falling in past year and feels steady on his feet without any assistance.     Medications    Current Outpatient Medications   Medication Sig Dispense Refill   ???  aspirin, buffered 81 mg Tab Take 81 mg by mouth. Frequency:QD   Dosage:81   MG  Instructions:  Note:Dose: 81MG      ??? cholecalciferol, vitamin D3, (VITAMIN D3) 2,000 unit cap Take 2,000 Units by mouth every other day.     ??? cycloSPORINE modified (NEORAL) 100 MG capsule TAKE 1 CAPSULE BY MOUTH TWICE DAILY 60 each 11   ??? cycloSPORINE modified (NEORAL) 25 MG capsule TAKE 3 CAPSULES (75MG ) BY MOUTH TWICE DAILY 180 each 11 ??? docusate sodium (COLACE) 100 MG capsule Take 100 mg by mouth two (2) times a day as needed. Frequency:BID   Dosage:100   MG  Instructions:  Note:Dose: 100MG      ??? enalapril (VASOTEC) 10 MG tablet Take 2 tablets (20 mg total) by mouth Two (2) times a day. 120 tablet 11   ??? furosemide (LASIX) 20 MG tablet Take 1 tablet (20 mg total) by mouth daily. 90 tablet 3   ??? hydrALAZINE (APRESOLINE) 25 MG tablet Take 3 tablets (75 mg total) by mouth Three (3) times a day. 810 tablet 2   ??? hydroCHLOROthiazide (HYDRODIURIL) 50 MG tablet Take 1 tablet (50 mg total) by mouth daily. 90 tablet 3   ??? lancets Misc (E11.39); NPI: [id number]. Check blood sugar 3 times daily before meals 100 each 11   ??? MEDICAL SUPPLY ITEM (E11.39); NPI: [id number]. Check blood sugar 3 times daily before meals 1 Package 0   ??? metoprolol tartrate (LOPRESSOR) 50 MG tablet Take 1 tablet (50 mg total) by mouth Two (2) times a day. 180 tablet 3   ??? mycophenolate (CELLCEPT) 250 mg capsule TAKE 2 CAPSULES (500MG ) BY MOUTH TWICE DAILY 120 capsule 11   ??? omeprazole (PRILOSEC) 40 MG capsule Take 1 capsule (40 mg total) by mouth daily. TAKE 1 CAPSULE (40 MG TOTAL) BY MOUTH DAILY. 30 capsule 11   ??? sodium bicarbonate 650 mg tablet Take 1 tablet (650 mg total) by mouth Three (3) times a day. 180 tablet 3   ??? sodium polystyrene, SPS, with sorbitol 15-20 gram/60 mL Susp Take 15 Gm daily as needed  For elevated potassium level. 360 mL 2   ??? traMADoL (ULTRAM) 50 mg tablet TAKE 1 TABLET BY MOUTH EVERY 6 HOURS AS NEEDED FOR PAIN 92 tablet 0     No current facility-administered medications for this visit.        Physical Exam    BP 176/70 (BP Site: R Arm, BP Position: Sitting, BP Cuff Size: Medium)  - Pulse 61  - Temp 36.3 ??C (97.4 ??F) (Temporal)  - Resp 14  - Wt 99.3 kg (219 lb)  - BMI 28.11 kg/m??     General: Patient is a pleasant male in no apparent distress.  Eyes: Sclera anicteric.  Neck: Supple without LAD/JVD/bruits. Lungs: Clear to auscultation bilaterally, no wheezes/rales/rhonchi.  Cardiovascular: Grade 1/6 systolic murmur  Abdomen: Soft, notender/nondistended. Positive bowel sounds. No hepatosplenomegaly, masses or bruits appreciated.  Extremities: 1+ RLE edema, trace LLE edema.   Skin: Without rash  Neurological: Grossly nonfocal.  Psychiatric: Mood and affect appropriate.      Laboratory Results    Recent Results (from the past 170 hour(s))   Parathyroid Hormone (PTH)    Collection Time: 05/03/19  9:35 AM   Result Value Ref Range    PTH 163.2 (H) 12.0 - 72.0 pg/mL    Calcium 9.7 8.5 - 10.2 mg/dL   Lipid panel    Collection Time: 05/03/19  9:35 AM   Result Value Ref  Range    Triglycerides 133 1 - 149 mg/dL    Cholesterol 161 (H) 100 - 199 mg/dL    HDL 48 40 - 59 mg/dL    LDL Calculated 096 (H) 60 - 99 mg/dL    VLDL Cholesterol Cal 26.6 12 - 42 mg/dL    Chol/HDL Ratio 4.5 <0.4    Non-HDL Cholesterol 170 mg/dL    FASTING Yes    Phosphorus Level    Collection Time: 05/03/19  9:35 AM   Result Value Ref Range    Phosphorus 4.0 2.9 - 4.7 mg/dL   Magnesium Level    Collection Time: 05/03/19  9:35 AM   Result Value Ref Range    Magnesium 1.4 (L) 1.6 - 2.2 mg/dL   Basic Metabolic Panel    Collection Time: 05/03/19  9:35 AM   Result Value Ref Range    Sodium 138 135 - 145 mmol/L    Potassium 6.1 (HH) 3.5 - 5.0 mmol/L    Chloride 110 (H) 98 - 107 mmol/L    CO2 20.0 (L) 22.0 - 30.0 mmol/L    Anion Gap 8 7 - 15 mmol/L    BUN 52 (H) 7 - 21 mg/dL    Creatinine 5.40 (H) 0.70 - 1.30 mg/dL    BUN/Creatinine Ratio 20     EGFR CKD-EPI Non-African American, Male 25 (L) >=60 mL/min/1.69m2    EGFR CKD-EPI African American, Male 28 (L) >=60 mL/min/1.109m2    Glucose 97 70 - 99 mg/dL    Calcium 9.7 8.5 - 98.1 mg/dL   CBC w/ Differential    Collection Time: 05/03/19  9:35 AM   Result Value Ref Range    WBC 3.6 (L) 4.5 - 11.0 10*9/L    RBC 4.89 4.50 - 5.90 10*12/L    HGB 12.3 (L) 13.5 - 17.5 g/dL    HCT 19.1 47.8 - 29.5 %    MCV 83.8 80.0 - 100.0 fL MCH 25.2 (L) 26.0 - 34.0 pg    MCHC 30.1 (L) 31.0 - 37.0 g/dL    RDW 62.1 (H) 30.8 - 15.0 %    MPV 8.7 7.0 - 10.0 fL    Platelet 190 150 - 440 10*9/L    Neutrophils % 61.3 %    Lymphocytes % 24.5 %    Monocytes % 9.1 %    Eosinophils % 2.4 %    Basophils % 0.7 %    Absolute Neutrophils 2.2 2.0 - 7.5 10*9/L    Absolute Lymphocytes 0.9 (L) 1.5 - 5.0 10*9/L    Absolute Monocytes 0.3 0.2 - 0.8 10*9/L    Absolute Eosinophils 0.1 0.0 - 0.4 10*9/L    Absolute Basophils 0.0 0.0 - 0.1 10*9/L    Large Unstained Cells 2 0 - 4 %    Microcytosis Slight (A) Not Present    Anisocytosis Slight (A) Not Present    Hypochromasia Marked (A) Not Present   Morphology Review    Collection Time: 05/03/19  9:35 AM   Result Value Ref Range    Smear Review Comments See Comment (A) Undefined

## 2019-05-03 NOTE — Unmapped (Signed)
AOBP performed right arm, medium cuff.  1st reading - 180/72, p 22  2nd reading - 176/70, p 61  3rd reading - 171/68, p 61  Average - 176/70, p 61

## 2019-05-03 NOTE — Unmapped (Signed)
Denies CP, SOB, abdominal pain, n/v/d, urinary problems, fever, tremors, or swelling.    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 Feeling good.    HA this morning but it is because he has not eaten.    140-150/70 is avg BP    Took cyclo at 7pm last night and will get labs after apt

## 2019-05-04 LAB — BK VIRUS QUANTITATIVE PCR, BLOOD: BK BLOOD RESULT: NOT DETECTED

## 2019-05-04 LAB — BK BLOOD LOG(10): Lab: 0

## 2019-05-04 LAB — CMV QUANT: Lab: 112 — ABNORMAL HIGH

## 2019-05-04 LAB — CMV DNA, QUANTITATIVE, PCR: CMV QUANT LOG10: 2.05 {Log_IU}/mL — ABNORMAL HIGH (ref <0.00)

## 2019-05-06 LAB — VITAMIN D, TOTAL (25OH): Lab: 27.2

## 2019-05-08 LAB — EBV VIRAL LOAD RESULT: Lab: NOT DETECTED

## 2019-05-08 LAB — VITAMIN D 1,25-DIHYDROXY: 1,25-Dihydroxyvitamin D:MCnc:Pt:Ser/Plas:Qn:: 22

## 2019-05-17 DIAGNOSIS — Z940 Kidney transplant status: Principal | ICD-10-CM

## 2019-05-17 MED ORDER — MYCOPHENOLATE MOFETIL 250 MG CAPSULE
ORAL_CAPSULE | Freq: Two times a day (BID) | ORAL | 11 refills | 30 days | Status: CP
Start: 2019-05-17 — End: 2020-05-16
  Filled 2019-06-14: qty 120, 30d supply, fill #0

## 2019-05-17 MED ORDER — CYCLOSPORINE MODIFIED 100 MG CAPSULE
ORAL_CAPSULE | Freq: Two times a day (BID) | ORAL | 11 refills | 30 days | Status: CP
Start: 2019-05-17 — End: 2020-05-16
  Filled 2019-06-14: qty 180, 30d supply, fill #0

## 2019-05-17 MED ORDER — CYCLOSPORINE MODIFIED 25 MG CAPSULE
ORAL_CAPSULE | Freq: Two times a day (BID) | ORAL | 11 refills | 30 days | Status: CP
Start: 2019-05-17 — End: 2020-05-16

## 2019-05-18 NOTE — Unmapped (Signed)
Per test claim for Gengraf at the [facility], patient needs Medication Assistance Program for High Copay.

## 2019-05-29 DIAGNOSIS — Z940 Kidney transplant status: Principal | ICD-10-CM

## 2019-05-29 MED ORDER — SODIUM BICARBONATE 650 MG TABLET
ORAL_TABLET | Freq: Three times a day (TID) | ORAL | 3 refills | 60 days | Status: CP
Start: 2019-05-29 — End: 2020-05-29

## 2019-06-04 NOTE — Unmapped (Signed)
[facility] Refill Coordination Note    Specialty Medication(s) to be Shipped:   Transplant: mycophenolate mofetil 250mg , Gengraf 25mg  and Gengraf 100mg     Other medication(s) to be shipped: N/A     Andrew Diaz, DOB: 06-Sep-1955  Phone: [phone] (home)       All above HIPAA information was verified with patient.     Was a Nurse, learning disability used for this call? No    Completed refill call assessment today to schedule patient's medication shipment from the [facility] [phone]).       Specialty medication(s) and dose(s) confirmed: Regimen is correct and unchanged.   Changes to medications: [name] reports no changes at this time.  Changes to insurance: No  Questions for the pharmacist: No    Confirmed patient received Welcome Packet with first shipment. The patient will receive a drug information handout for each medication shipped and additional FDA Medication Guides as required.       DISEASE/MEDICATION-SPECIFIC INFORMATION        N/A    SPECIALTY MEDICATION ADHERENCE     Medication Adherence    Patient reported X missed doses in the last month: 0  Specialty Medication: Gengraf 25mg   Patient is on additional specialty medications: Yes  Additional Specialty Medications: Gengraf 100mg   Patient Reported Additional Medication X Missed Doses in the Last Month: 0  Patient is on more than two specialty medications: Yes  Specialty Medication: Mycophenolate 250mg   Patient Reported Additional Medication X Missed Doses in the Last Month: 0  Adherence tools used: patient uses a pill box to manage medications  Support network for adherence: family member          Gengraf 25 mg: 14 days of medicine on hand   Gengraf 100 mg: 14 days of medicine on hand   Mycophenolate 250 mg: 14 days of medicine on hand     SHIPPING     Shipping address confirmed in Epic.     Delivery Scheduled: Yes, Expected medication delivery date: 06/15/2019.     Medication will be delivered via UPS to the prescription address in Epic W[facility].    Lorelei Pont [facility] Specialty Technician

## 2019-06-07 DIAGNOSIS — Z940 Kidney transplant status: Principal | ICD-10-CM

## 2019-06-08 MED ORDER — TRAMADOL 50 MG TABLET
ORAL_TABLET | 0 refills | 0 days | Status: CP
Start: 2019-06-08 — End: ?

## 2019-06-14 MED FILL — GENGRAF 25 MG CAPSULE: 30 days supply | Qty: 180 | Fill #0 | Status: AC

## 2019-06-14 MED FILL — GENGRAF 100 MG CAPSULE: 30 days supply | Qty: 60 | Fill #0 | Status: AC

## 2019-06-14 MED FILL — MYCOPHENOLATE MOFETIL 250 MG CAPSULE: 30 days supply | Qty: 120 | Fill #0 | Status: AC

## 2019-06-14 MED FILL — GENGRAF 100 MG CAPSULE: ORAL | 30 days supply | Qty: 60 | Fill #0

## 2019-07-02 NOTE — Unmapped (Signed)
[facility] Clinical Assessment & Refill Coordination Note    Andrew Diaz [place], [place]: 1955/07/09  Phone: [phone] (home)     All above HIPAA information was verified with patient.     Was a Nurse, learning disability used for this call? No    Specialty Medication(s):   Transplant: mycophenolate mofetil 250mg  and gengraf 25mg  and 100mg      Current Outpatient Medications   Medication Sig Dispense Refill   ??? aspirin, buffered 81 mg Tab Take 81 mg by mouth. Frequency:QD   Dosage:81   MG  Instructions:  Note:Dose: 81MG      ??? cholecalciferol, vitamin D3, (VITAMIN D3) 2,000 unit cap Take 2,000 Units by mouth every other day.     ??? cycloSPORINE modified (GENGRAF) 100 MG capsule Take 1 capsule by mouth twice daily 60 capsule 11   ??? cycloSPORINE modified (GENGRAF) 25 MG capsule Take 3 capsules (75 mg total) by mouth two (2) times a day. 180 capsule 11   ??? docusate sodium (COLACE) 100 MG capsule Take 100 mg by mouth two (2) times a day as needed. Frequency:BID   Dosage:100   MG  Instructions:  Note:Dose: 100MG      ??? enalapril (VASOTEC) 10 MG tablet Take 2 tablets (20 mg total) by mouth Two (2) times a day. 120 tablet 11   ??? furosemide (LASIX) 20 MG tablet Take 1 tablet (20 mg total) by mouth daily. 90 tablet 3   ??? hydrALAZINE (APRESOLINE) 25 MG tablet Take 3 tablets (75 mg total) by mouth Three (3) times a day. 810 tablet 2   ??? hydroCHLOROthiazide (HYDRODIURIL) 50 MG tablet Take 1 tablet (50 mg total) by mouth daily. 90 tablet 3   ??? lancets Misc (E11.39); NPI: [id number]. Check blood sugar 3 times daily before meals 100 each 11   ??? MEDICAL SUPPLY ITEM (E11.39); NPI: [id number]. Check blood sugar 3 times daily before meals 1 Package 0   ??? metoprolol tartrate (LOPRESSOR) 50 MG tablet Take 1 tablet (50 mg total) by mouth Two (2) times a day. 180 tablet 3   ??? mycophenolate (CELLCEPT) 250 mg capsule Take 2 capsules (500 mg total) by mouth two (2) times a day. 120 capsule 11   ??? omeprazole (PRILOSEC) 40 MG capsule Take 1 capsule (40 mg total) by mouth daily. TAKE 1 CAPSULE (40 MG TOTAL) BY MOUTH DAILY. 30 capsule 11   ??? sodium bicarbonate 650 mg tablet Take 1 tablet (650 mg total) by mouth Three (3) times a day. 180 tablet 3   ??? sodium polystyrene, SPS, with sorbitol 15-20 gram/60 mL Susp Take 15 Gm daily as needed  For elevated potassium level. (Patient not taking: Reported on 07/02/2019) 360 mL 2   ??? traMADoL (ULTRAM) 50 mg tablet TAKE 1 TABLET BY MOUTH EVERY 6 HOURS AS NEEDED FOR PAIN 92 tablet 0     No current facility-administered medications for this visit.         Changes to medications: Andrew Diaz reports no changes at this time.    Allergies   Allergen Reactions   ??? Pollen Extracts Other (See Comments)     cough       Changes to allergies: No    SPECIALTY MEDICATION ADHERENCE     gengraf 100mg   : 28 days of medicine on hand   gengraf 25mg   : 21 days of medicine on hand   Mycophenolate   : 21 days of medicine on hand      Medication Adherence  Patient reported X missed doses in the last month: 0  Specialty Medication: gengraf 100mg   Patient is on additional specialty medications: Yes  Additional Specialty Medications: gengraf 25mg   Patient Reported Additional Medication X Missed Doses in the Last Month: 0  Patient is on more than two specialty medications: Yes  Specialty Medication: mycophenolate 250mg   Patient Reported Additional Medication X Missed Doses in the Last Month: 0  Adherence tools used: patient uses a pill box to manage medications  Support network for adherence: family member          Specialty medication(s) dose(s) confirmed: Regimen is correct and unchanged.     Are there any concerns with adherence? No    Adherence counseling provided? Not needed    CLINICAL MANAGEMENT AND INTERVENTION      Clinical Benefit Assessment:    Do you feel the medicine is effective or helping your condition? Yes    Clinical Benefit counseling provided? Not needed    Adverse Effects Assessment:    Are you experiencing any side effects? No    Are you experiencing difficulty administering your medicine? No    Quality of Life Assessment:    How many days over the past month did your transplant  keep you from your normal activities? For example, brushing your teeth or getting up in the morning. 0    Have you discussed this with your provider? Not needed    Therapy Appropriateness:    Is therapy appropriate? Yes, therapy is appropriate and should be continued    DISEASE/MEDICATION-SPECIFIC INFORMATION      N/A    PATIENT SPECIFIC NEEDS     ? Does the patient have any physical, cognitive, or cultural barriers? No    ? Is the patient high risk? Yes, patient is taking a REMS drug. Medication is dispensed in compliance with REMS program.     ? Does the patient require a Care Management Plan? No     ? Does the patient require physician intervention or other additional services (i.e. nutrition, smoking cessation, social work)? No      SHIPPING     Specialty Medication(s) to be Shipped:   na    Other medication(s) to be shipped: na  Pt wants call back in 1.5- 2 weeks     Changes to insurance: No    Delivery Scheduled: Patient declined refill at this time due to pt wants call back in 1.5- 2 weeks.     Medication will be delivered via na to the confirmed na address in [facility].    The patient will receive a drug information handout for each medication shipped and additional FDA Medication Guides as required.  Verified that patient has previously received a Conservation officer, historic buildings.    All of the patient's questions and concerns have been addressed.    Thad Ranger   [facility] Specialty Pharmacist

## 2019-07-13 ENCOUNTER — Ambulatory Visit: Payer: Medicare Other | Attending: Internal Medicine

## 2019-07-13 DIAGNOSIS — Z23 Encounter for immunization: Secondary | ICD-10-CM

## 2019-07-13 NOTE — Progress Notes (Signed)
   Covid-19 Vaccination Clinic  Name:  Blake Torres    MRN: [id number] DOB: 09-19-1955  07/13/2019  Blake Torres was observed post Covid-19 immunization for 15 minutes without incident. He was provided with Vaccine Information Sheet and instruction to access the V-Safe system.   Blake Torres was instructed to call 911 with any severe reactions post vaccine: Marland Kitchen Difficulty breathing  . Swelling of face and throat  . A fast heartbeat  . A bad rash all over body  . Dizziness and weakness   Immunizations Administered    Name Date Dose VIS Date Route   Moderna COVID-19 Vaccine 07/13/2019  9:06 AM 0.5 mL 04/03/2019 Intramuscular   Manufacturer: Moderna   Lot: [id number]   NDC: 27253-664-40

## 2019-07-16 NOTE — Unmapped (Signed)
[facility] Refill Coordination Note    Specialty Medication(s) to be Shipped:   Transplant: mycophenolate mofetil 250mg , gengraf 25mg  and gengraf 100mg     Other medication(s) to be shipped: N/A     Andrew Diaz, DOB: [birth year]  Phone: [phone] (home)       All above HIPAA information was verified with patient.     Was a Nurse, learning disability used for this call? No    Completed refill call assessment today to schedule patient's medication shipment from the [facility] [phone]).       Specialty medication(s) and dose(s) confirmed: Regimen is correct and unchanged.   Changes to medications: Andrew Diaz reports no changes at this time.  Changes to insurance: No  Questions for the pharmacist: No    Confirmed patient received Welcome Packet with first shipment. The patient will receive a drug information handout for each medication shipped and additional FDA Medication Guides as required.       DISEASE/MEDICATION-SPECIFIC INFORMATION        N/A    SPECIALTY MEDICATION ADHERENCE     Medication Adherence    Patient reported X missed doses in the last month: 0  Specialty Medication: Gengraf 25mg   Patient is on additional specialty medications: Yes  Additional Specialty Medications: Gengraf 100mg   Patient Reported Additional Medication X Missed Doses in the Last Month: 0  Patient is on more than two specialty medications: Yes  Specialty Medication: Mycophenolate 250mg   Patient Reported Additional Medication X Missed Doses in the Last Month: 0  Adherence tools used: patient uses a pill box to manage medications  Support network for adherence: family member          Gengraf 25 mg: 10 days of medicine on hand   Gengraf 100 mg: 10 days of medicine on hand   Mycophenolate 250 mg: 10 days of medicine on hand     SHIPPING     Shipping address confirmed in Epic.     Delivery Scheduled: Yes, Expected medication delivery date: 07/20/2019.     Medication will be delivered via UPS to the prescription address in Epic [facility].    Andrew Diaz [facility] Specialty Technician

## 2019-07-19 MED FILL — GENGRAF 25 MG CAPSULE: 30 days supply | Qty: 180 | Fill #1 | Status: AC

## 2019-07-19 MED FILL — GENGRAF 25 MG CAPSULE: ORAL | 30 days supply | Qty: 180 | Fill #1

## 2019-07-19 MED FILL — MYCOPHENOLATE MOFETIL 250 MG CAPSULE: 30 days supply | Qty: 120 | Fill #1 | Status: AC

## 2019-07-19 MED FILL — MYCOPHENOLATE MOFETIL 250 MG CAPSULE: ORAL | 30 days supply | Qty: 120 | Fill #1

## 2019-07-19 MED FILL — GENGRAF 100 MG CAPSULE: ORAL | 30 days supply | Qty: 60 | Fill #1

## 2019-07-19 MED FILL — GENGRAF 100 MG CAPSULE: 30 days supply | Qty: 60 | Fill #1 | Status: AC

## 2019-07-24 DIAGNOSIS — I10 Essential (primary) hypertension: Principal | ICD-10-CM

## 2019-07-24 DIAGNOSIS — Z940 Kidney transplant status: Principal | ICD-10-CM

## 2019-07-24 MED ORDER — METOPROLOL TARTRATE 50 MG TABLET
ORAL_TABLET | Freq: Two times a day (BID) | ORAL | 3 refills | 90 days | Status: CP
Start: 2019-07-24 — End: ?

## 2019-07-24 NOTE — Unmapped (Signed)
Pt request for RX Refill

## 2019-08-14 ENCOUNTER — Ambulatory Visit: Payer: Medicare Other | Attending: Internal Medicine

## 2019-08-14 DIAGNOSIS — Z23 Encounter for immunization: Secondary | ICD-10-CM

## 2019-08-14 NOTE — Progress Notes (Signed)
   Covid-19 Vaccination Clinic  Name:  Blake Torres    MRN: [id number] DOB: Jul 20, 1955  08/14/2019  [name] was observed post Covid-19 immunization for 15 minutes without incident. He was provided with Vaccine Information Sheet and instruction to access the V-Safe system.   [name] was instructed to call 911 with any severe reactions post vaccine: Marland Kitchen Difficulty breathing  . Swelling of face and throat  . A fast heartbeat  . A bad rash all over body  . Dizziness and weakness   Immunizations Administered    Name Date Dose VIS Date Route   Moderna COVID-19 Vaccine 08/14/2019  8:40 AM 0.5 mL 04/03/2019 Intramuscular   Manufacturer: Moderna   Lot: [id number]   NDC: 01779-390-30

## 2019-08-21 IMAGING — CR DG RIBS W/ CHEST 3+V*L*
1 series · 5 of 5 positions shown · non-contrast
Comparison: December 01, 2006

CLINICAL DATA: Pain on left after fall.

EXAM:
LEFT RIBS AND CHEST - 3+ VIEW

[Series 1: dg ribs unilateral w/chest left · 0.14mm/px · 5 of 5 slices shown]
[im 1/5]
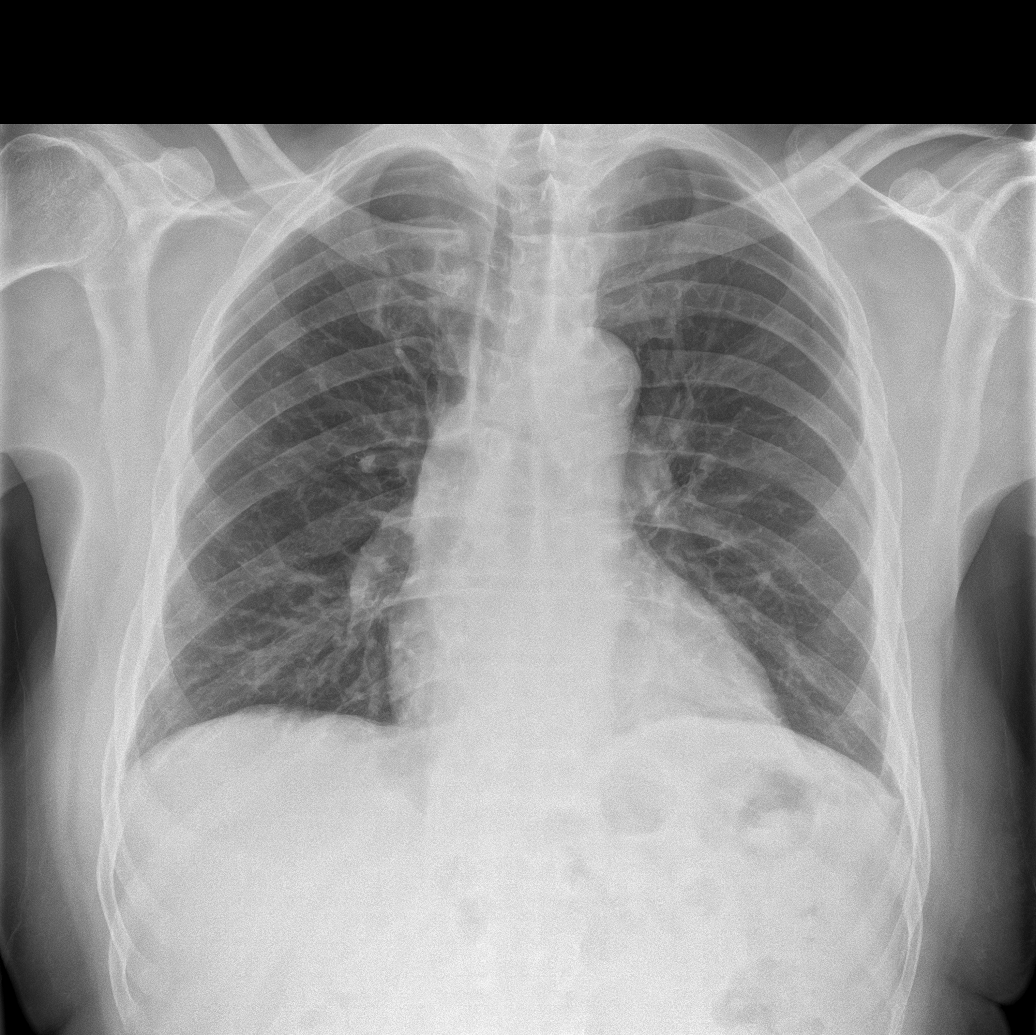
[im 2/5]
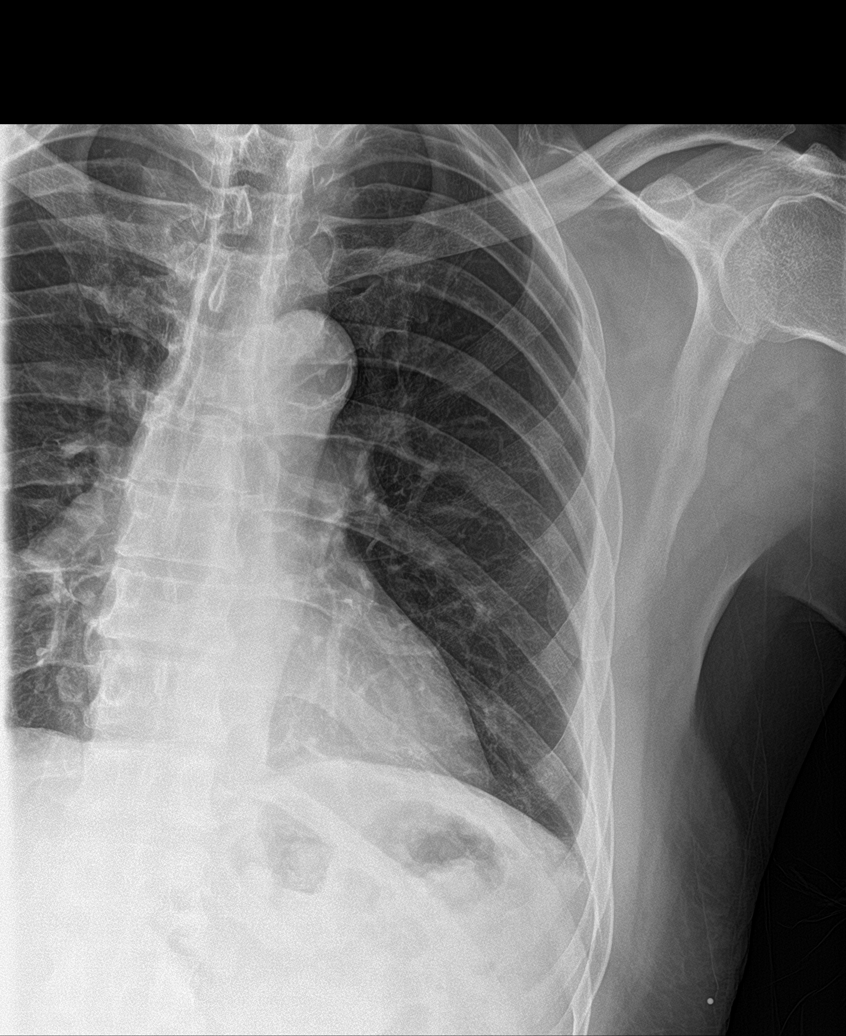
[im 3/5]
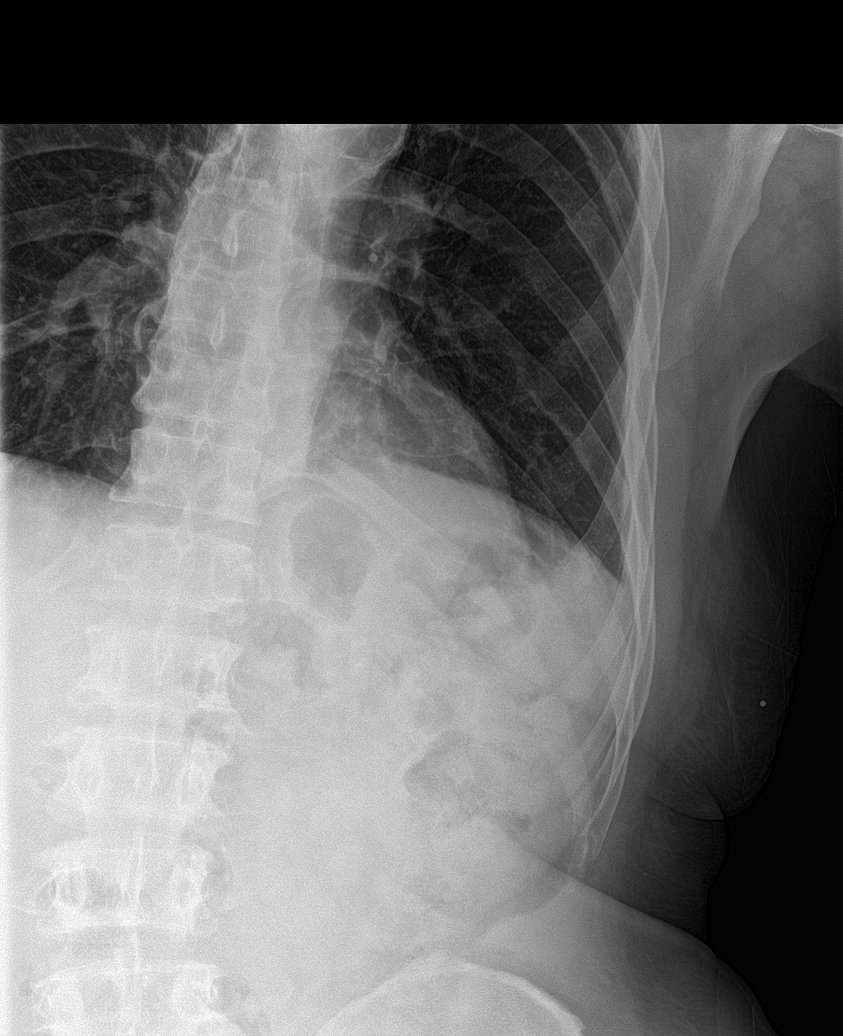
[im 4/5]
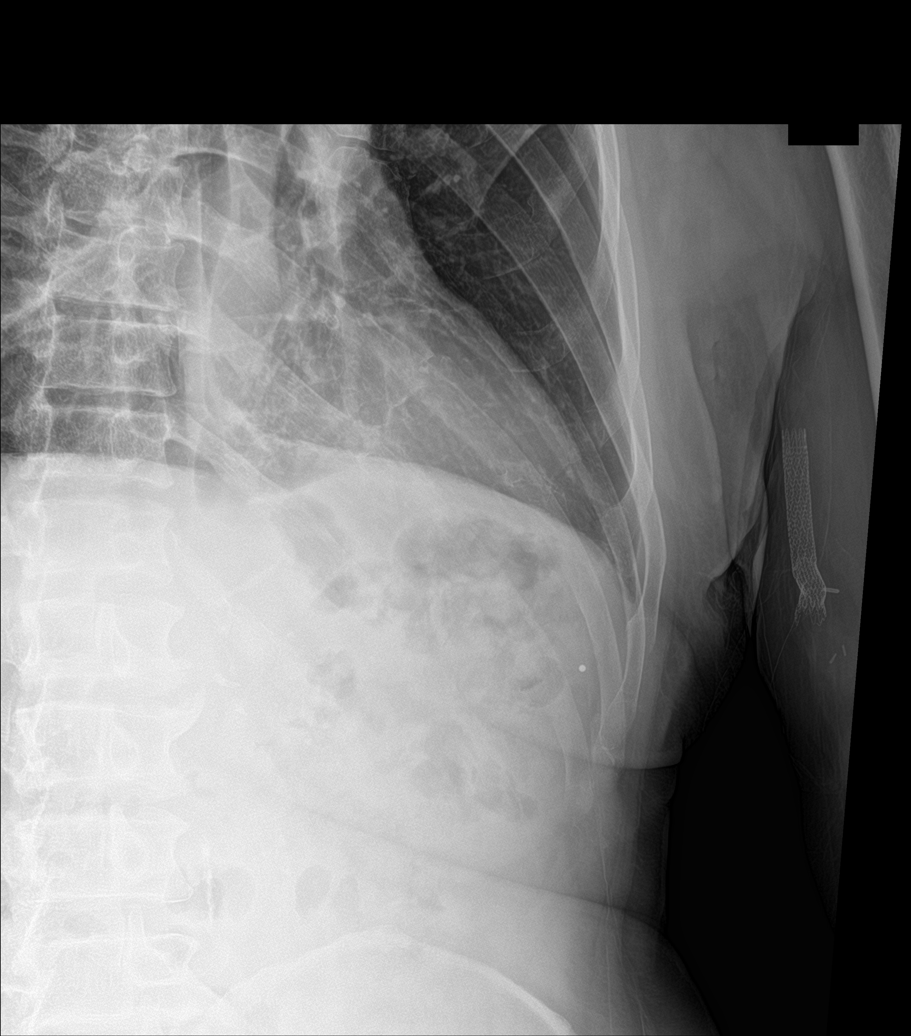
[im 5/5]
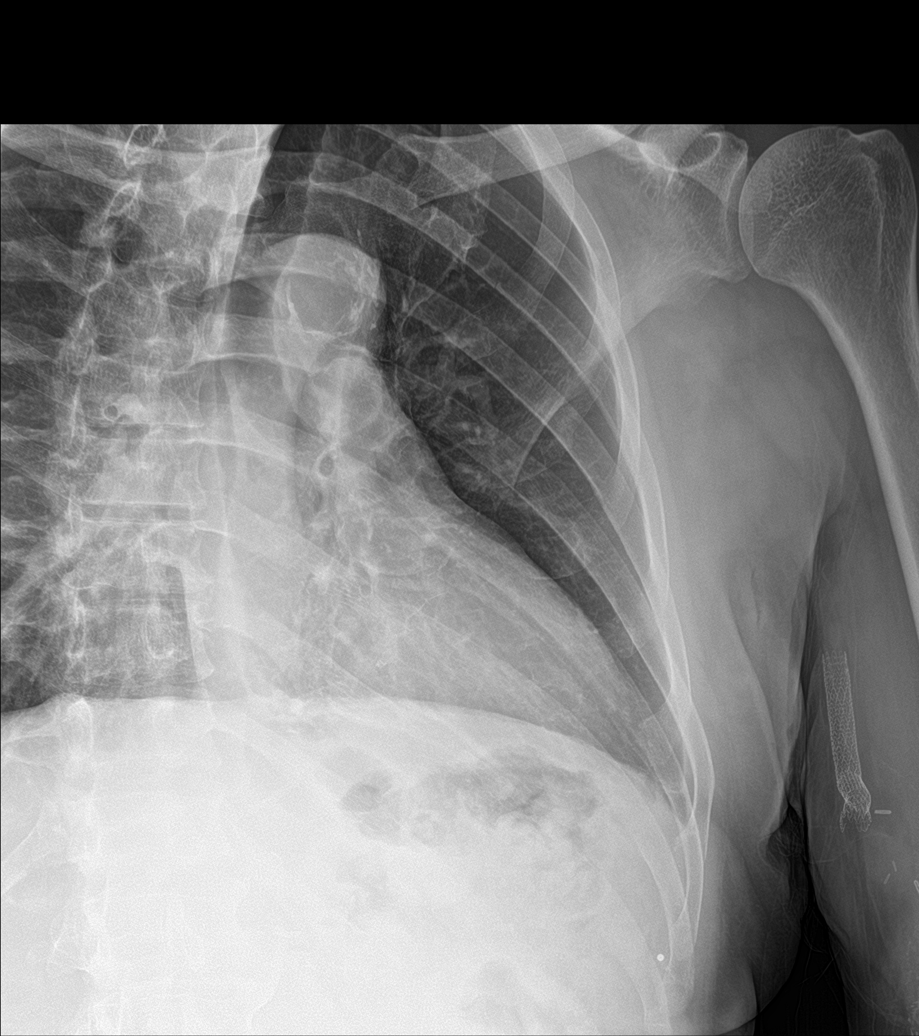

[5 of 5 positions shown; findings below may reference images not displayed]

FINDINGS: Fractures of the left posterior 6 and 7 ribs without significant
displacement. No pneumothorax. The cardiomediastinal silhouette is
normal. No pulmonary nodules, masses, or infiltrates.
IMPRESSION: Fractures of the left posterior sixth and seventh ribs without
pneumothorax.

## 2019-08-27 NOTE — Unmapped (Signed)
[facility] Refill Coordination Note    Specialty Medication(s) to be Shipped:   Transplant: mycophenolate mofetil 250mg , Gengraf 25mg  and Gengraf 100mg     Other medication(s) to be shipped: N/A     Andrew Diaz, DOB: [birth year]  Phone: [phone] (home)       All above HIPAA information was verified with patient.     Was a Nurse, learning disability used for this call? No    Completed refill call assessment today to schedule patient's medication shipment from the [facility] [phone]).       Specialty medication(s) and dose(s) confirmed: Regimen is correct and unchanged.   Changes to medications: Andrew Diaz reports no changes at this time.  Changes to insurance: No  Questions for the pharmacist: No    Confirmed patient received Welcome Packet with first shipment. The patient will receive a drug information handout for each medication shipped and additional FDA Medication Guides as required.       DISEASE/MEDICATION-SPECIFIC INFORMATION        N/A    SPECIALTY MEDICATION ADHERENCE     Medication Adherence    Patient reported X missed doses in the last month: 0  Specialty Medication: Gengraf 25mg   Patient is on additional specialty medications: Yes  Additional Specialty Medications: Gengraf 100mg   Patient Reported Additional Medication X Missed Doses in the Last Month: 0  Patient is on more than two specialty medications: Yes  Specialty Medication: Mycophenolate 250mg   Patient Reported Additional Medication X Missed Doses in the Last Month: 0  Adherence tools used: patient uses a pill box to manage medications  Support network for adherence: family member          Gengraf 25 mg: 8 days of medicine on hand   Gengraf 100 mg: 8 days of medicine on hand   Mycophenolate 250 mg: 8 days of medicine on hand     SHIPPING     Shipping address confirmed in Epic.     Delivery Scheduled: Yes, Expected medication delivery date: 09/03/2019.     Medication will be delivered via UPS to the prescription address in Epic W[facility].    Lorelei Pont [facility] Specialty Technician

## 2019-08-31 NOTE — Unmapped (Signed)
Andrew Diaz Andrew Diaz 's ENTIRE shipment will be delayed due to Insufficient inventory We have contacted the patient and communicated the delivery change to patient/caregiver We will reschedule the medication for the delivery date that the patient agreed upon. We have confirmed the delivery date as 09/04/19 .

## 2019-09-03 MED FILL — GENGRAF 100 MG CAPSULE: 30 days supply | Qty: 60 | Fill #2 | Status: AC

## 2019-09-03 MED FILL — GENGRAF 25 MG CAPSULE: 30 days supply | Qty: 180 | Fill #2 | Status: AC

## 2019-09-03 MED FILL — GENGRAF 25 MG CAPSULE: ORAL | 30 days supply | Qty: 180 | Fill #2

## 2019-09-03 MED FILL — MYCOPHENOLATE MOFETIL 250 MG CAPSULE: 30 days supply | Qty: 120 | Fill #2 | Status: AC

## 2019-09-03 MED FILL — GENGRAF 100 MG CAPSULE: ORAL | 30 days supply | Qty: 60 | Fill #2

## 2019-09-03 MED FILL — MYCOPHENOLATE MOFETIL 250 MG CAPSULE: ORAL | 30 days supply | Qty: 120 | Fill #2

## 2019-09-24 DIAGNOSIS — I159 Secondary hypertension, unspecified: Principal | ICD-10-CM

## 2019-09-24 DIAGNOSIS — Z940 Kidney transplant status: Principal | ICD-10-CM

## 2019-09-24 MED ORDER — ENALAPRIL MALEATE 10 MG TABLET
ORAL_TABLET | 3 refills | 0 days | Status: CP
Start: 2019-09-24 — End: ?

## 2019-09-24 MED ORDER — OMEPRAZOLE 40 MG CAPSULE,DELAYED RELEASE
ORAL_CAPSULE | 3 refills | 0 days | Status: CP
Start: 2019-09-24 — End: ?

## 2019-09-24 MED ORDER — HYDRALAZINE 25 MG TABLET
ORAL_TABLET | Freq: Three times a day (TID) | ORAL | 2 refills | 90.00000 days | Status: CP
Start: 2019-09-24 — End: ?

## 2019-09-24 NOTE — Unmapped (Signed)
Patient has requested a medication refill via EPIC

## 2019-10-03 DIAGNOSIS — Z940 Kidney transplant status: Principal | ICD-10-CM

## 2019-10-03 MED ORDER — TRAMADOL 50 MG TABLET
ORAL_TABLET | 0 refills | 0 days | Status: CP
Start: 2019-10-03 — End: ?

## 2019-10-09 NOTE — Unmapped (Signed)
6/9: disregard below, spoke with patient today Andrew Diaz      [facility] Refill Coordination Note  Medication: Gengraf 100mg , Gengraf 25mg  and Cellcept 250mg     Unable to reach patient to schedule shipment for medication being filled at [facility]. Mailbox is full so unable to leave message.  As this is the 3rd unsuccessful attempt to reach the patient, no additional phone call attempts will be made at this time.      Phone numbers attempted: [phone], [phone]  Last scheduled delivery: 09/03/19    Please call the [facility] at [phone] (option 4) should you have any further questions.      Thanks,  [facility] Specialty Team

## 2019-10-10 MED FILL — GENGRAF 25 MG CAPSULE: ORAL | 30 days supply | Qty: 180 | Fill #3

## 2019-10-10 MED FILL — GENGRAF 25 MG CAPSULE: 30 days supply | Qty: 180 | Fill #3 | Status: AC

## 2019-10-10 MED FILL — GENGRAF 100 MG CAPSULE: 30 days supply | Qty: 60 | Fill #3 | Status: AC

## 2019-10-10 MED FILL — GENGRAF 100 MG CAPSULE: ORAL | 30 days supply | Qty: 60 | Fill #3

## 2019-10-10 MED FILL — MYCOPHENOLATE MOFETIL 250 MG CAPSULE: 30 days supply | Qty: 120 | Fill #3 | Status: AC

## 2019-10-10 MED FILL — MYCOPHENOLATE MOFETIL 250 MG CAPSULE: ORAL | 30 days supply | Qty: 120 | Fill #3

## 2019-10-10 NOTE — Unmapped (Signed)
6/9: speaking with patient today re: inability to reach after 3 attempts -Andrew Diaz    [facility] Clinical Assessment & Refill Coordination Note    Izzak Fries Diaz, Andrew: [birth year]  Phone: [phone] (home)     All above HIPAA information was verified with patient.     Was a Nurse, learning disability used for this call? No    Specialty Medication(s):   Transplant: mycophenolate mofetil 250 mg and gengraf 25 and 100mg      Current Outpatient Medications   Medication Sig Dispense Refill   ??? aspirin, buffered 81 mg Tab Take 81 mg by mouth. Frequency:QD   Dosage:81   MG  Instructions:  Note:Dose: 81MG      ??? cholecalciferol, vitamin D3, (VITAMIN D3) 2,000 unit cap Take 2,000 Units by mouth every other day.     ??? cycloSPORINE modified (GENGRAF) 100 MG capsule Take 1 capsule by mouth twice daily 60 capsule 11   ??? cycloSPORINE modified (GENGRAF) 25 MG capsule Take 3 capsules (75 mg total) by mouth two (2) times a day. 180 capsule 11   ??? docusate sodium (COLACE) 100 MG capsule Take 100 mg by mouth two (2) times a day as needed. Frequency:BID   Dosage:100   MG  Instructions:  Note:Dose: 100MG      ??? enalapril (VASOTEC) 10 MG tablet TAKE 2 TABLETS (20 MG TOTAL) BY MOUTH TWO (2) TIMES A DAY---NEED TO MAKE APPT FOR MORE REFILLS 360 tablet 3   ??? furosemide (LASIX) 20 MG tablet Take 1 tablet (20 mg total) by mouth daily. 90 tablet 3   ??? hydrALAZINE (APRESOLINE) 25 MG tablet TAKE 3 TABLETS (75 MG TOTAL) BY MOUTH THREE (3) TIMES A DAY. 810 tablet 2   ??? hydroCHLOROthiazide (HYDRODIURIL) 50 MG tablet Take 1 tablet (50 mg total) by mouth daily. 90 tablet 3   ??? lancets Misc (E11.39); NPI: [id number]. Check blood sugar 3 times daily before meals 100 each 11   ??? MEDICAL SUPPLY ITEM (E11.39); NPI: [id number]. Check blood sugar 3 times daily before meals 1 Package 0   ??? metoprolol tartrate (LOPRESSOR) 50 MG tablet TAKE 1 TABLET (50 MG TOTAL) BY MOUTH TWO (2) TIMES A DAY. 180 tablet 3   ??? mycophenolate (CELLCEPT) 250 mg capsule Take 2 capsules (500 mg total) by mouth two (2) times a day. 120 capsule 11   ??? omeprazole (PRILOSEC) 40 MG capsule TAKE 1 CAPSULE (40 MG TOTAL) BY MOUTH DAILY. 90 capsule 3   ??? sodium bicarbonate 650 mg tablet Take 1 tablet (650 mg total) by mouth Three (3) times a day. 180 tablet 3   ??? sodium polystyrene, SPS, with sorbitol 15-20 gram/60 mL Susp Take 15 Gm daily as needed  For elevated potassium level. (Patient not taking: Reported on 07/02/2019) 360 mL 2   ??? traMADoL (ULTRAM) 50 mg tablet TAKE 1 TABLET BY MOUTH EVERY 6 HOURS AS NEEDED FOR PAIN 92 tablet 0     No current facility-administered medications for this visit.        Changes to medications: Pate reports no changes at this time.    Allergies   Allergen Reactions   ??? Pollen Extracts Other (See Comments)     cough       Changes to allergies: No    SPECIALTY MEDICATION ADHERENCE     gengraf 25mg   : 3 days of medicine on hand   gengraf 100mg   : 3 days of medicine on hand   Mycophenolate 250mg   : 3  days of medicine on hand     Medication Adherence    Patient reported X missed doses in the last month: 0  Specialty Medication: gengraf 100mg   Patient is on additional specialty medications: Yes  Additional Specialty Medications: gengraf 25mg   Patient Reported Additional Medication X Missed Doses in the Last Month: 0  Patient is on more than two specialty medications: Yes  Specialty Medication: mycophenolate 250mg   Patient Reported Additional Medication X Missed Doses in the Last Month: 0  Adherence tools used: patient uses a pill box to manage medications  Support network for adherence: family member          Specialty medication(s) dose(s) confirmed: Regimen is correct and unchanged.     Are there any concerns with adherence? no, however patient is aware to return our calls and contact us if down to less than 1 week on hand    Adherence counseling provided? see above    CLINICAL MANAGEMENT AND INTERVENTION      Clinical Benefit Assessment:    Do you feel the medicine is effective or helping your condition? Yes    Clinical Benefit counseling provided? Not needed    Adverse Effects Assessment:    Are you experiencing any side effects? No    Are you experiencing difficulty administering your medicine? No    Quality of Life Assessment:    How many days over the past month did your transplant  keep you from your normal activities? For example, brushing your teeth or getting up in the morning. 0    Have you discussed this with your provider? Not needed    Therapy Appropriateness:    Is therapy appropriate? Yes, therapy is appropriate and should be continued    DISEASE/MEDICATION-SPECIFIC INFORMATION      N/A    PATIENT SPECIFIC NEEDS     - Does the patient have any physical, cognitive, or cultural barriers? No    - Is the patient high risk? Yes, patient is taking a REMS drug. Medication is dispensed in compliance with REMS program.     - Does the patient require a Care Management Plan? No     - Does the patient require physician intervention or other additional services (i.e. nutrition, smoking cessation, social work)? No      SHIPPING     Specialty Medication(s) to be Shipped:   Transplant: mycophenolate mofetil 250mg  and ]gengraf 25 and 100mg     Other medication(s) to be shipped: na     Changes to insurance: No    Delivery Scheduled: Yes, Expected medication delivery date: 10/11/2019.     Medication will be delivered via UPS to the confirmed prescription address in [facility].    The patient will receive a drug information handout for each medication shipped and additional FDA Medication Guides as required.  Verified that patient has previously received a Conservation officer, historic buildings.    All of the patient's questions and concerns have been addressed.    Thad Ranger   [facility] Specialty Pharmacist

## 2019-10-16 DIAGNOSIS — Z940 Kidney transplant status: Principal | ICD-10-CM

## 2019-10-16 DIAGNOSIS — Z79899 Other long term (current) drug therapy: Principal | ICD-10-CM

## 2019-10-17 ENCOUNTER — Ambulatory Visit: Admit: 2019-10-17 | Discharge: 2019-10-17 | Payer: MEDICARE | Attending: Nephrology | Primary: Nephrology

## 2019-10-17 DIAGNOSIS — Z79899 Other long term (current) drug therapy: Principal | ICD-10-CM

## 2019-10-17 DIAGNOSIS — E118 Type 2 diabetes mellitus with unspecified complications: Principal | ICD-10-CM

## 2019-10-17 DIAGNOSIS — Z940 Kidney transplant status: Principal | ICD-10-CM

## 2019-10-17 DIAGNOSIS — I10 Essential (primary) hypertension: Principal | ICD-10-CM

## 2019-10-17 DIAGNOSIS — N400 Enlarged prostate without lower urinary tract symptoms: Principal | ICD-10-CM

## 2019-10-17 DIAGNOSIS — Z48298 Encounter for aftercare following other organ transplant: Principal | ICD-10-CM

## 2019-10-17 DIAGNOSIS — D849 Immunodeficiency, unspecified: Principal | ICD-10-CM

## 2019-10-17 LAB — COMPREHENSIVE METABOLIC PANEL
ALBUMIN: 3.9 g/dL (ref 3.4–5.0)
ALT (SGPT): <7 U/L — ABNORMAL LOW (ref 10–49)
ANION GAP: 7 mmol/L (ref 3–11)
AST (SGOT): 11 U/L (ref <34)
BILIRUBIN TOTAL: 0.6 mg/dL (ref 0.3–1.2)
BLOOD UREA NITROGEN: 60 mg/dL — ABNORMAL HIGH (ref 9–23)
CALCIUM: 10 mg/dL (ref 8.7–10.4)
CHLORIDE: 105 mmol/L (ref 98–107)
CO2: 23.2 mmol/L (ref 20.0–31.0)
CREATININE: 2.73 mg/dL — ABNORMAL HIGH (ref 0.60–1.10)
EGFR CKD-EPI AA MALE: 27 mL/min/{1.73_m2}
EGFR CKD-EPI NON-AA MALE: 23 mL/min/{1.73_m2}
GLUCOSE RANDOM: 139 mg/dL (ref 70–179)
POTASSIUM: 4.9 mmol/L (ref 3.5–5.1)
PROTEIN TOTAL: 7.5 g/dL (ref 5.7–8.2)
SODIUM: 135 mmol/L (ref 135–145)

## 2019-10-17 LAB — CBC W/ AUTO DIFF
BASOPHILS ABSOLUTE COUNT: 0 10*9/L (ref 0.0–0.1)
BASOPHILS RELATIVE PERCENT: 1.2 %
EOSINOPHILS RELATIVE PERCENT: 1.6 %
HEMATOCRIT: 38.1 % (ref 38.0–50.0)
HEMOGLOBIN: 12.1 g/dL — ABNORMAL LOW (ref 13.5–17.5)
LYMPHOCYTES ABSOLUTE COUNT: 0.7 10*9/L (ref 0.7–4.0)
LYMPHOCYTES RELATIVE PERCENT: 20 %
MEAN CORPUSCULAR HEMOGLOBIN CONC: 31.8 g/dL (ref 30.0–36.0)
MEAN CORPUSCULAR HEMOGLOBIN: 24.8 pg — ABNORMAL LOW (ref 26.0–34.0)
MEAN CORPUSCULAR VOLUME: 78.1 fL — ABNORMAL LOW (ref 81.0–95.0)
MEAN PLATELET VOLUME: 7.7 fL (ref 7.0–10.0)
NEUTROPHILS ABSOLUTE COUNT: 2.4 10*9/L (ref 1.7–7.7)
NEUTROPHILS RELATIVE PERCENT: 65.2 %
PLATELET COUNT: 219 10*9/L (ref 150–450)
RED BLOOD CELL COUNT: 4.88 10*12/L (ref 4.32–5.72)
RED CELL DISTRIBUTION WIDTH: 15.4 % — ABNORMAL HIGH (ref 12.0–15.0)
WBC ADJUSTED: 3.7 10*9/L (ref 3.5–10.5)

## 2019-10-17 LAB — URINALYSIS
BACTERIA: NONE SEEN /HPF
BILIRUBIN UA: NEGATIVE
BLOOD UA: NEGATIVE
GLUCOSE UA: NEGATIVE
HYALINE CASTS: 9 /LPF — ABNORMAL HIGH (ref 0–1)
KETONES UA: NEGATIVE
NITRITE UA: NEGATIVE
PH UA: 6 (ref 5.0–9.0)
PROTEIN UA: NEGATIVE
RBC UA: 1 /HPF (ref <3)
SPECIFIC GRAVITY UA: 1.015 (ref 1.005–1.030)
SQUAMOUS EPITHELIAL: 2 /HPF (ref 0–5)
TRANSITIONAL EPITHELIAL: 1 /HPF (ref 0–2)
UROBILINOGEN UA: 1
WBC UA: <1 /HPF (ref <2)

## 2019-10-17 LAB — CYCLOSPORINE, TROUGH: Lab: 367

## 2019-10-17 LAB — MAGNESIUM
MAGNESIUM: 1.8 mg/dL (ref 1.6–2.6)
Magnesium:MCnc:Pt:Ser/Plas:Qn:: 1.8

## 2019-10-17 LAB — CREATININE, URINE: Lab: 53.6

## 2019-10-17 LAB — PHOSPHORUS: Phosphate:MCnc:Pt:Ser/Plas:Qn:: 3.7

## 2019-10-17 LAB — PROTEIN / CREATININE RATIO, URINE: PROTEIN/CREAT RATIO, URINE: 0.257

## 2019-10-17 LAB — SODIUM: Sodium:SCnc:Pt:Ser/Plas:Qn:: 135

## 2019-10-17 LAB — TACROLIMUS LEVEL, TROUGH: TACROLIMUS, TROUGH: <1 ng/mL — ABNORMAL LOW (ref 5.0–15.0)

## 2019-10-17 LAB — PROSTATE SPECIFIC ANTIGEN: Prostate specific Ag:MCnc:Pt:Ser/Plas:Qn:: 0.73

## 2019-10-17 LAB — SQUAMOUS EPITHELIAL: Lab: 2

## 2019-10-17 LAB — MEAN PLATELET VOLUME: Platelet mean volume:EntVol:Pt:Bld:Qn:Automated count: 7.7

## 2019-10-17 LAB — TACROLIMUS, TROUGH: Lab: <1 — ABNORMAL LOW

## 2019-10-17 NOTE — Unmapped (Signed)
AOBP   Patient's blood pressure was taken on right upper arm, medium cuff.   1st reading 198/83, pulse: 65  2nd reading 182/73, pulse: 64  3rd reading 172/74, pulse: 64  Average reading 184/77, pulse: 64

## 2019-10-17 NOTE — Unmapped (Addendum)
Transplant Nephrology??Clinic??Visit  ??    Assessment and Plan    Andrew Diaz is a 63 y.o. 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 Active medical issues include:    1. 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  Serum creatinine is 2.73 mg/dL  (baseline range 5.2-8.4 mg/dL).Though he has class I DSAs he has no history of rejection (most recent DSA to HLA A1 (MFI 1752) and HLA B8 (MFI 5106) on 12/08/18.  BK viral load was undetectable 05/03/19.  UP/C is 0.257 consistent with recent values that are all <1.0.    2. Immunosuppression management. We will continue the current immunosuppressive regimen targeting cyclosporine levels of approximately 75-125. Today's level is 367.  I suspect this is not a trough level. He will continue cyclosporine 175 mg BID and CellCept 500 mg BID.     3. Hypertension. Systolic BP is again elevated despite a multi-drug regimen. He will make a concerted effort to take his noon dose of hydralazine. He did not tolerate 2 trials of amlodipine due to bone pain.      4. History of UTI. He has no current symptoms of UTI.     5. Hyperlipidemia. He was intolerant of Pravachol due to myalgias experienced with all statins.     6. 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 K today is pending. He is encouraged not to miss his noon dose of sodium bicarbonate and continue 650 mg TID. He will also continue hydrochlorothiazide and furosemide for K control. Will continue ACE-I and use prn Kayexylate if needed for hyperkalemia    7. Diabetes mellitus, diet controlled. Will check hemoglobin A1C today.      8. Cancer screens. Patient had a colonoscopy revealing no polyps in 2016, follow up is now due (2021). Renal US 12/08/18 revealed no masses. Will check PSA today. He has no symptoms of prostate disease.     9. Immunizations. Patient has received both doses of Shingrix (10/2016, 04/2017). Other vaccines are up-to-date including COVID-19 vaccine (Modernal x 2). He is aware of the limited efficacy of COVID-19 vaccination.      10. Follow-up. He will be seen again in 6 months.     History of Present Illness    Transplant History:  Andrew Diaz is 64 y.o.and received a DCD kidney transplant on 11/12/2006. 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 He also has a history of a class I donor specific antibodies, but has no history of rejection or significant proteinuria with baseline serum creatinine 1.6-2.4 mg/dL, but over the past 18 months creatinine has been 2.3-2.7.     Interval History:  He is without complaints today. He denies dysuria or allograft tenderness. Edema in the LEs is improved. He denies dysuria, graft tenderness,shortness of breath, or chest pain. Home BP's are in the 140-160 range systolic and the 13'K diastolic. He often misses his noon dose of hydralazine and sodium bicarbonate. Blood glucoses have not been checked consistently.      He denies contact with COVID-19 people and has been practicing recommended infection prevention methods. He has completed 2 doses of Moderna vaccine.    Review of Systems     All other systems are reviewed and are negative.  A 10 systems review was completed.    Medications    Current Outpatient Medications   Medication Sig Dispense Refill   ??? aspirin, buffered  81 mg Tab Take 81 mg by mouth. Frequency:QD   Dosage:81   MG  Instructions:  Note:Dose: 81MG      ??? cholecalciferol, vitamin D3, (VITAMIN D3) 2,000 unit cap Take 2,000 Units by mouth every other day.     ??? cycloSPORINE modified (GENGRAF) 100 MG capsule Take 1 capsule by mouth twice daily 60 capsule 11   ??? cycloSPORINE modified (GENGRAF) 25 MG capsule Take 3 capsules (75 mg total) by mouth two (2) times a day. 180 capsule 11   ??? docusate sodium (COLACE) 100 MG capsule Take 100 mg by mouth two (2) times a day as needed. Frequency:BID Dosage:100   MG  Instructions:  Note:Dose: 100MG      ??? enalapril (VASOTEC) 10 MG tablet TAKE 2 TABLETS (20 MG TOTAL) BY MOUTH TWO (2) TIMES A DAY---NEED TO MAKE APPT FOR MORE REFILLS 360 tablet 3   ??? furosemide (LASIX) 20 MG tablet Take 1 tablet (20 mg total) by mouth daily. 90 tablet 3   ??? hydrALAZINE (APRESOLINE) 25 MG tablet TAKE 3 TABLETS (75 MG TOTAL) BY MOUTH THREE (3) TIMES A DAY. 810 tablet 2   ??? hydroCHLOROthiazide (HYDRODIURIL) 50 MG tablet Take 1 tablet (50 mg total) by mouth daily. 90 tablet 3   ??? lancets Misc (E11.39); NPI: [id number]. Check blood sugar 3 times daily before meals 100 each 11   ??? MEDICAL SUPPLY ITEM (E11.39); NPI: [id number]. Check blood sugar 3 times daily before meals 1 Package 0   ??? metoprolol tartrate (LOPRESSOR) 50 MG tablet TAKE 1 TABLET (50 MG TOTAL) BY MOUTH TWO (2) TIMES A DAY. 180 tablet 3   ??? mycophenolate (CELLCEPT) 250 mg capsule Take 2 capsules (500 mg total) by mouth two (2) times a day. 120 capsule 11   ??? omeprazole (PRILOSEC) 40 MG capsule TAKE 1 CAPSULE (40 MG TOTAL) BY MOUTH DAILY. 90 capsule 3   ??? sodium bicarbonate 650 mg tablet Take 1 tablet (650 mg total) by mouth Three (3) times a day. 180 tablet 3   ??? sodium polystyrene, SPS, with sorbitol 15-20 gram/60 mL Susp Take 15 Gm daily as needed  For elevated potassium level. (Patient not taking: Reported on 07/02/2019) 360 mL 2   ??? traMADoL (ULTRAM) 50 mg tablet TAKE 1 TABLET BY MOUTH EVERY 6 HOURS AS NEEDED FOR PAIN 92 tablet 0     No current facility-administered medications for this visit.       Physical Exam    BP 184/77 (BP Site: R Arm, BP Position: Sitting, BP Cuff Size: Medium)  - Pulse 64  - Temp 35.7 ??C (96.2 ??F) (Temporal)  - Ht 188 cm (6' 2)  - Wt 96.9 kg (213 lb 9.6 oz)  - BMI 27.42 kg/m??     General: Patient is a pleasant male in no apparent distress.  Eyes: Sclera anicteric.  Neck: Supple without LAD/JVD/bruits.  Lungs: Clear to auscultation bilaterally, no wheezes/rales/rhonchi.  Cardiovascular: Grade 1/6 systolic murmur  Abdomen: Soft, notender/nondistended. Positive bowel sounds. No hepatosplenomegaly, masses or bruits appreciated.  Extremities: trace bilateral LE edema.   Skin: Without rash  Neurological: Grossly nonfocal.  Psychiatric: Mood and affect appropriate.    Laboratory Results    Recent Results (from the past 170 hour(s))   CBC w/ Differential    Collection Time: 10/17/19  9:38 AM   Result Value Ref Range    WBC 3.7 3.5 - 10.5 10*9/L    RBC 4.88 4.32 - 5.72 10*12/L    HGB 12.1 (L) 13.5 -  17.5 g/dL    HCT 96.2 95.2 - 84.1 %    MCV 78.1 (L) 81.0 - 95.0 fL    MCH 24.8 (L) 26.0 - 34.0 pg    MCHC 31.8 30.0 - 36.0 g/dL    RDW 32.4 (H) 40.1 - 15.0 %    MPV 7.7 7.0 - 10.0 fL    Platelet 219 150 - 450 10*9/L    Neutrophils % 65.2 %    Lymphocytes % 20.0 %    Monocytes % 12.0 %    Eosinophils % 1.6 %    Basophils % 1.2 %    Absolute Neutrophils 2.4 1.7 - 7.7 10*9/L    Absolute Lymphocytes 0.7 0.7 - 4.0 10*9/L    Absolute Monocytes 0.4 0.1 - 1.0 10*9/L    Absolute Eosinophils 0.1 0.0 - 0.7 10*9/L    Absolute Basophils 0.0 0.0 - 0.1 10*9/L

## 2019-10-18 LAB — BK VIRUS QUANTITATIVE PCR, BLOOD

## 2019-10-18 LAB — CMV QUANT: Lab: 0

## 2019-10-18 LAB — CMV DNA, QUANTITATIVE, PCR: CMV VIRAL LD: NOT DETECTED

## 2019-10-18 LAB — BK BLOOD QUANT: Lab: 0

## 2019-10-20 LAB — HEMOGLOBIN A1C: Hemoglobin A1c/Hemoglobin.total:MFr:Pt:Bld:Qn:: 6.5 — ABNORMAL HIGH

## 2019-10-24 LAB — HLA DS POST TRANSPLANT
ANTI-DONOR HLA-A #1 MFI: 2875 MFI — ABNORMAL HIGH
ANTI-DONOR HLA-A #2 MFI: 0 MFI
ANTI-DONOR HLA-B #1 MFI: 4471 MFI — ABNORMAL HIGH
ANTI-DONOR HLA-C #1 MFI: 1158 MFI — ABNORMAL HIGH
ANTI-DONOR HLA-DQB #2 MFI: 391 MFI
ANTI-DONOR HLA-DR #1 MFI: 163 MFI
ANTI-DONOR HLA-DR #2 MFI: 299 MFI

## 2019-10-24 LAB — FSAB CLASS 1 ANTIBODY SPECIFICITY

## 2019-10-24 LAB — HLA CLASS 1 ANTIBODY

## 2019-10-24 LAB — ANTI-DONOR HLA-B #1 MFI: Lab: 4471 — ABNORMAL HIGH

## 2019-10-24 LAB — FSAB CLASS 2 ANTIBODY SPECIFICITY

## 2019-10-24 LAB — HLA CL2 AB COMMENT: Lab: 0

## 2019-10-29 MED ORDER — ENALAPRIL MALEATE 10 MG TABLET
ORAL_TABLET | Freq: Two times a day (BID) | ORAL | 3 refills | 180 days | Status: CP
Start: 2019-10-29 — End: ?

## 2019-11-06 NOTE — Unmapped (Signed)
[facility] Refill Coordination Note    Specialty Medication(s) to be Shipped:   Transplant: mycophenolate mofetil 250mg , Gengraf 25mg  and Gengraf 100mg     Other medication(s) to be shipped: n/a     [name], DOB: 1955-10-14  Phone: [phone] (home)       All above HIPAA information was verified with patient.     Was a Nurse, learning disability used for this call? No    Completed refill call assessment today to schedule patient's medication shipment from the [facility] [phone]).       Specialty medication(s) and dose(s) confirmed: Regimen is correct and unchanged.   Changes to medications: Ramirez reports no changes at this time.  Changes to insurance: No  Questions for the pharmacist: No    Confirmed patient received Welcome Packet with first shipment. The patient will receive a drug information handout for each medication shipped and additional FDA Medication Guides as required.       DISEASE/MEDICATION-SPECIFIC INFORMATION        N/A    SPECIALTY MEDICATION ADHERENCE     Medication Adherence    Patient reported X missed doses in the last month: 0  Specialty Medication: Gengraf 25mg   Patient is on additional specialty medications: Yes  Additional Specialty Medications: Gengraf 100mg   Patient Reported Additional Medication X Missed Doses in the Last Month: 0  Patient is on more than two specialty medications: Yes  Specialty Medication: Mycophenolate 250mg   Patient Reported Additional Medication X Missed Doses in the Last Month: 0  Informant: patient  Adherence tools used: patient uses a pill box to manage medications  Support network for adherence: family member                Mycophenolate 250 mg: 10 days of medicine on hand   Gengraf 25 mg: 10 days of medicine on hand   Mycophenolate 250 mg: 10 days of medicine on hand         SHIPPING     Shipping address confirmed in Epic.     Delivery Scheduled: Yes, Expected medication delivery date: 11/13/19.     Medication will be delivered via UPS to the prescription address in Epic WAM.    Jasper Loser   [facility] Specialty Technician

## 2019-11-12 MED FILL — GENGRAF 25 MG CAPSULE: 30 days supply | Qty: 180 | Fill #4 | Status: AC

## 2019-11-12 MED FILL — MYCOPHENOLATE MOFETIL 250 MG CAPSULE: 30 days supply | Qty: 120 | Fill #4 | Status: AC

## 2019-11-12 MED FILL — GENGRAF 100 MG CAPSULE: 30 days supply | Qty: 60 | Fill #4 | Status: AC

## 2019-11-12 MED FILL — GENGRAF 100 MG CAPSULE: ORAL | 30 days supply | Qty: 60 | Fill #4

## 2019-11-12 MED FILL — GENGRAF 25 MG CAPSULE: ORAL | 30 days supply | Qty: 180 | Fill #4

## 2019-11-12 MED FILL — MYCOPHENOLATE MOFETIL 250 MG CAPSULE: ORAL | 30 days supply | Qty: 120 | Fill #4

## 2019-11-20 NOTE — Unmapped (Signed)
UNOS form

## 2019-12-01 DIAGNOSIS — Z940 Kidney transplant status: Principal | ICD-10-CM

## 2019-12-01 MED ORDER — SODIUM BICARBONATE 650 MG TABLET
ORAL_TABLET | Freq: Three times a day (TID) | ORAL | 2 refills | 0 days
Start: 2019-12-01 — End: ?

## 2019-12-03 NOTE — Unmapped (Signed)
Patient has requested a medication refill via EPIC

## 2019-12-07 NOTE — Unmapped (Signed)
[facility] Refill Coordination Note    Specialty Medication(s) to be Shipped:   Transplant: mycophenolate mofetil 250mg , Gengraf 25mg  and Gengraf 100mg     Other medication(s) to be shipped: No additional medications requested for fill at this time     Andrew Diaz, DOB: 12-Dec-1955  Phone: [phone] (home)       All above HIPAA information was verified with patient.     Was a Nurse, learning disability used for this call? No    Completed refill call assessment today to schedule patient's medication shipment from the [facility] [phone]).       Specialty medication(s) and dose(s) confirmed: Regimen is correct and unchanged.   Changes to medications: Andrew Diaz reports no changes at this time.  Changes to insurance: No  Questions for the pharmacist: No    Confirmed patient received Welcome Packet with first shipment. The patient will receive a drug information handout for each medication shipped and additional FDA Medication Guides as required.       DISEASE/MEDICATION-SPECIFIC INFORMATION        N/A    SPECIALTY MEDICATION ADHERENCE     Medication Adherence    Patient reported X missed doses in the last month: 0  Specialty Medication: Mycophenolate 250mg   Patient is on additional specialty medications: Yes  Additional Specialty Medications: Gengraf 25mg   Patient Reported Additional Medication X Missed Doses in the Last Month: 0  Patient is on more than two specialty medications: Yes  Specialty Medication: Gengraf 100mg   Patient Reported Additional Medication X Missed Doses in the Last Month: 0  Adherence tools used: patient uses a pill box to manage medications  Support network for adherence: family member        Mycopehnolate 250 mg: 7 days of medicine on hand   Gengraf 25 mg: 7 days of medicine on hand   Gengraf 100 mg: 7 days of medicine on hand     SHIPPING     Shipping address confirmed in Epic.     Delivery Scheduled: Yes, Expected medication delivery date: 12/12/2019. Medication will be delivered via UPS to the prescription address in Epic WAM.    Lorelei Pont [facility] Specialty Technician

## 2019-12-11 MED FILL — GENGRAF 25 MG CAPSULE: ORAL | 30 days supply | Qty: 180 | Fill #5

## 2019-12-11 MED FILL — MYCOPHENOLATE MOFETIL 250 MG CAPSULE: 30 days supply | Qty: 120 | Fill #5 | Status: AC

## 2019-12-11 MED FILL — GENGRAF 100 MG CAPSULE: 30 days supply | Qty: 60 | Fill #5 | Status: AC

## 2019-12-11 MED FILL — MYCOPHENOLATE MOFETIL 250 MG CAPSULE: ORAL | 30 days supply | Qty: 120 | Fill #5

## 2019-12-11 MED FILL — GENGRAF 25 MG CAPSULE: 30 days supply | Qty: 180 | Fill #5 | Status: AC

## 2019-12-11 MED FILL — GENGRAF 100 MG CAPSULE: ORAL | 30 days supply | Qty: 60 | Fill #5

## 2019-12-13 ENCOUNTER — Encounter: Admit: 2019-12-13 | Payer: MEDICARE

## 2019-12-15 DIAGNOSIS — Z940 Kidney transplant status: Principal | ICD-10-CM

## 2019-12-15 DIAGNOSIS — I10 Essential (primary) hypertension: Principal | ICD-10-CM

## 2019-12-17 MED ORDER — HYDROCHLOROTHIAZIDE 50 MG TABLET
ORAL_TABLET | 3 refills | 0 days | Status: CP
Start: 2019-12-17 — End: ?

## 2019-12-17 NOTE — Unmapped (Signed)
Pt request RX Refill for HYDROCHLOROTHIAZIDE 50 MG TAB

## 2019-12-18 DIAGNOSIS — Z940 Kidney transplant status: Principal | ICD-10-CM

## 2019-12-18 MED ORDER — FUROSEMIDE 20 MG TABLET
ORAL_TABLET | 3 refills | 0 days
Start: 2019-12-18 — End: ?

## 2020-01-08 NOTE — Unmapped (Addendum)
Called patient to clarify medication dose, since [facility] sent Rx clarification    Pt. On enalapril 20 mg BID    Pt. Doing well, confirmed his upcoming appt 12/29    Will need annual imaging scheduled.    No other needs at this time.

## 2020-01-09 MED ORDER — ENALAPRIL MALEATE 10 MG TABLET
ORAL_TABLET | Freq: Two times a day (BID) | ORAL | 3 refills | 90.00000 days | Status: CP
Start: 2020-01-09 — End: ?

## 2020-01-22 NOTE — Unmapped (Signed)
The [facility] has made a third and final attempt to reach this patient to refill the following medication:gengraf, mycophenolate.      We have left voicemails on the following phone numbers: [phone] , have been unable to leave messages on the following phone numbers: [phone] and have sent a MyChart message.    Dates contacted: 9/2, 9/7, 9/21  Last scheduled delivery: 8/10    The patient may be at risk of non-compliance with this medication. The patient should call the [facility] at [phone] (option 4) to refill medication.    Thad Ranger   [facility] Specialty Pharmacist

## 2020-02-01 DIAGNOSIS — Z940 Kidney transplant status: Principal | ICD-10-CM

## 2020-02-01 MED ORDER — FUROSEMIDE 20 MG TABLET
ORAL_TABLET | Freq: Every day | ORAL | 3 refills | 90.00000 days | Status: CP
Start: 2020-02-01 — End: ?

## 2020-02-01 NOTE — Unmapped (Signed)
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     Was a Nurse, learning disability used for this call? No    Completed refill call assessment today to schedule patient's medication shipment from the [facility] [phone]).       Specialty medication(s) and dose(s) confirmed: Regimen is correct and unchanged.   Changes to medications: Andrew Diaz reports no changes at this time.  Changes to insurance: No  Questions for the pharmacist: No    Confirmed patient received Welcome Packet with first shipment. The patient will receive a drug information handout for each medication shipped and additional FDA Medication Guides as required.       DISEASE/MEDICATION-SPECIFIC INFORMATION        N/A    SPECIALTY MEDICATION ADHERENCE     Medication Adherence    Patient reported X missed doses in the last month: 0  Specialty Medication: Gengraf 25  Patient is on additional specialty medications: Yes  Additional Specialty Medications: Gengraf 100  Patient Reported Additional Medication X Missed Doses in the Last Month: 0  Patient is on more than two specialty medications: Yes  Specialty Medication: Mycophenolate  Any gaps in refill history greater than 2 weeks in the last 3 months: no  Demonstrates understanding of importance of adherence: yes  Informant: patient  Adherence tools used: patient uses a pill box to manage medications  Support network for adherence: family member                Cyclosporine 25mg : Patient has 3 days of medication on hand  Cyclosporine 100mg : Patient has 3 days of medication on hand  Mycophenolate 250mg : Patient has 3 days of medication on hand      SHIPPING     Shipping address confirmed in Epic.     Delivery Scheduled: Yes, Expected medication delivery date: 10/4.     Medication will be delivered via UPS to the prescription address in Epic W[facility].    [name]   [facility] Specialty Technician

## 2020-02-04 MED FILL — MYCOPHENOLATE MOFETIL 250 MG CAPSULE: 30 days supply | Qty: 120 | Fill #6 | Status: AC

## 2020-02-04 MED FILL — GENGRAF 25 MG CAPSULE: 30 days supply | Qty: 180 | Fill #6 | Status: AC

## 2020-02-04 MED FILL — GENGRAF 25 MG CAPSULE: ORAL | 30 days supply | Qty: 180 | Fill #6

## 2020-02-04 MED FILL — MYCOPHENOLATE MOFETIL 250 MG CAPSULE: ORAL | 30 days supply | Qty: 120 | Fill #6

## 2020-02-04 MED FILL — GENGRAF 100 MG CAPSULE: 30 days supply | Qty: 60 | Fill #6 | Status: AC

## 2020-02-04 MED FILL — GENGRAF 100 MG CAPSULE: ORAL | 30 days supply | Qty: 60 | Fill #6

## 2020-02-22 DIAGNOSIS — Z940 Kidney transplant status: Principal | ICD-10-CM

## 2020-02-25 MED ORDER — TRAMADOL 50 MG TABLET
ORAL_TABLET | 3 refills | 0 days | Status: CP
Start: 2020-02-25 — End: ?

## 2020-02-25 NOTE — Unmapped (Signed)
[facility] Clinical Assessment & Refill Coordination Note    [place], [place]: Aug 19, 1955  Phone: [phone] (home)     All above HIPAA information was verified with patient.     Was a Nurse, learning disability used for this call? 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 aspirin, buffered 81 mg Tab Take 81 mg by mouth. Frequency:QD   Dosage:81   MG  Instructions:  Note:Dose: 81MG      ??? cholecalciferol, vitamin D3, (VITAMIN D3) 2,000 unit cap Take 2,000 Units by mouth every other day.     ??? cycloSPORINE modified (GENGRAF) 100 MG capsule Take 1 capsule by mouth twice daily 60 capsule 11   ??? cycloSPORINE modified (GENGRAF) 25 MG capsule Take 3 capsules (75 mg total) by mouth two (2) times a day. 180 capsule 11   ??? docusate sodium (COLACE) 100 MG capsule Take 100 mg by mouth two (2) times a day as needed. Frequency:BID   Dosage:100   MG  Instructions:  Note:Dose: 100MG      ??? enalapril (VASOTEC) 10 MG tablet Take 2 tablets (20 mg total) by mouth Two (2) times a day. 360 tablet 3   ??? furosemide (LASIX) 20 MG tablet Take 1 tablet (20 mg total) by mouth daily. 90 tablet 3   ??? hydrALAZINE (APRESOLINE) 25 MG tablet TAKE 3 TABLETS (75 MG TOTAL) BY MOUTH THREE (3) TIMES A DAY. 810 tablet 2   ??? hydroCHLOROthiazide (HYDRODIURIL) 50 MG tablet TAKE 1 TABLET BY MOUTH EVERY DAY 90 tablet 3   ??? lancets Misc (E11.39); NPI: [id number]. Check blood sugar 3 times daily before meals 100 each 11   ??? MEDICAL SUPPLY ITEM (E11.39); NPI: [id number]. Check blood sugar 3 times daily before meals 1 Package 0   ??? metoprolol tartrate (LOPRESSOR) 50 MG tablet TAKE 1 TABLET (50 MG TOTAL) BY MOUTH TWO (2) TIMES A DAY. 180 tablet 3   ??? mycophenolate (CELLCEPT) 250 mg capsule Take 2 capsules (500 mg total) by mouth two (2) times a day. 120 capsule 11   ??? omeprazole (PRILOSEC) 40 MG capsule TAKE 1 CAPSULE (40 MG TOTAL) BY MOUTH DAILY. 90 capsule 3   ??? sodium bicarbonate 650 mg tablet TAKE 1 TABLET (650 MG TOTAL) BY MOUTH THREE (3) TIMES A DAY. 270 tablet 2   ??? sodium polystyrene, SPS, with sorbitol 15-20 gram/60 mL Susp Take 15 Gm daily as needed  For elevated potassium level. (Patient not taking: Reported on 07/02/2019) 360 mL 2   ??? traMADoL (ULTRAM) 50 mg tablet TAKE 1 TABLET BY MOUTH EVERY 6 HOURS AS NEEDED FOR PAIN 92 tablet 0     No current facility-administered medications for this visit.        Changes to medications: Duriel reports no changes at this time.    Allergies   Allergen Reactions   ??? Pollen Extracts Other (See Comments)     cough       Changes to allergies: No    SPECIALTY MEDICATION ADHERENCE     gengraf 100mg   : 6 days of medicine on hand   gengraf 25mg   : 6 days of medicine on hand   Mycophenolate 250mg   : 6 days of medicine on hand       Medication Adherence    Patient reported X missed doses in the last month:  0  Specialty Medication: gengraf 100mg   Patient is on additional specialty medications: Yes  Additional Specialty Medications: gengraf 25mg   Patient Reported Additional Medication X Missed Doses in the Last Month: 0  Patient is on more than two specialty medications: Yes  Specialty Medication: mycophenolate 250mg   Patient Reported Additional Medication X Missed Doses in the Last Month: 0  Adherence tools used: patient uses a pill box to manage medications  Support network for adherence: family member          Specialty medication(s) dose(s) confirmed: Regimen is correct and unchanged.     Are there any concerns with adherence? No    Adherence counseling provided? Not needed    CLINICAL MANAGEMENT AND INTERVENTION      Clinical Benefit Assessment:    Do you feel the medicine is effective or helping your condition? Yes    Clinical Benefit counseling provided? Not needed    Adverse Effects Assessment:    Are you experiencing any side effects? No    Are you experiencing difficulty administering your medicine? No    Quality of Life Assessment:    How many days over the past month did your transplant  keep you from your normal activities? For example, brushing your teeth or getting up in the morning. 0    Have you discussed this with your provider? Not needed    Therapy Appropriateness:    Is therapy appropriate? Yes, therapy is appropriate and should be continued    DISEASE/MEDICATION-SPECIFIC INFORMATION      N/A    PATIENT SPECIFIC NEEDS     - Does the patient have any physical, cognitive, or cultural barriers? No    - Is the patient high risk? Yes, patient is taking a REMS drug. Medication is dispensed in compliance with REMS program    - Does the patient require a Care Management Plan? No     - Does the patient require physician intervention or other additional services (i.e. nutrition, smoking cessation, social work)? 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 pt aware this may be rts on insurance, however he states he runs out on sat 10/30. notes added in work order to get any overrides necessary for delivery on Fri 10/29 and call pt if any issues.     Medication will be delivered via UPS to the confirmed prescription address in [facility].    The patient will receive a drug information handout for each medication shipped and additional FDA Medication Guides as required.  Verified that patient has previously received a Conservation officer, historic buildings.    All of the patient's questions and concerns have been addressed.    Thad Ranger   [facility] Specialty Pharmacist

## 2020-02-28 MED FILL — GENGRAF 25 MG CAPSULE: ORAL | 30 days supply | Qty: 180 | Fill #7

## 2020-02-28 MED FILL — GENGRAF 100 MG CAPSULE: 30 days supply | Qty: 60 | Fill #7 | Status: AC

## 2020-02-28 MED FILL — GENGRAF 100 MG CAPSULE: ORAL | 30 days supply | Qty: 60 | Fill #7

## 2020-02-28 MED FILL — MYCOPHENOLATE MOFETIL 250 MG CAPSULE: ORAL | 30 days supply | Qty: 120 | Fill #7

## 2020-02-28 MED FILL — GENGRAF 25 MG CAPSULE: 30 days supply | Qty: 180 | Fill #7 | Status: AC

## 2020-02-28 MED FILL — MYCOPHENOLATE MOFETIL 250 MG CAPSULE: 30 days supply | Qty: 120 | Fill #7 | Status: AC

## 2020-03-20 NOTE — Unmapped (Addendum)
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     Was a Nurse, learning disability used for this call? No    Completed refill call assessment today to schedule patient's medication shipment from the [facility] [phone]).       Specialty medication(s) and dose(s) confirmed: Regimen is correct and unchanged.   Changes to medications: [name] reports no changes at this time.  Changes to insurance: No  Questions for the pharmacist: No    Confirmed patient received Welcome Packet with first shipment. The patient will receive a drug information handout for each medication shipped and additional FDA Medication Guides as required.       DISEASE/MEDICATION-SPECIFIC INFORMATION        N/A    SPECIALTY MEDICATION ADHERENCE     Medication Adherence    Patient reported X missed doses in the last month: 0  Specialty Medication: Gengraf 25mg   Patient is on additional specialty medications: Yes  Additional Specialty Medications: Gengraf 100mg   Patient Reported Additional Medication X Missed Doses in the Last Month: 0  Patient is on more than two specialty medications: Yes  Specialty Medication: Mycophenolate 250mg   Patient Reported Additional Medication X Missed Doses in the Last Month: 0  Adherence tools used: patient uses a pill box to manage medications  Support network for adherence: family member                Gengraf 25 mg: 7 days of medicine on hand   Gengraf 100 mg: 7 days of medicine on hand   Mycophenolate 250 mg: 7 days of medicine on hand        SHIPPING     Shipping address confirmed in Epic.     Delivery Scheduled: Yes, Expected medication delivery date: 03/25/20. Medication will be delivered via UPS to the prescription address in Epic WAM.    Tera Helper   [facility] Specialty Pharmacist

## 2020-03-25 MED FILL — GENGRAF 25 MG CAPSULE: ORAL | 30 days supply | Qty: 180 | Fill #8

## 2020-03-25 MED FILL — GENGRAF 100 MG CAPSULE: 30 days supply | Qty: 60 | Fill #8 | Status: AC

## 2020-03-25 MED FILL — MYCOPHENOLATE MOFETIL 250 MG CAPSULE: 30 days supply | Qty: 120 | Fill #8 | Status: AC

## 2020-03-25 MED FILL — GENGRAF 100 MG CAPSULE: ORAL | 30 days supply | Qty: 60 | Fill #8

## 2020-03-25 MED FILL — MYCOPHENOLATE MOFETIL 250 MG CAPSULE: ORAL | 30 days supply | Qty: 120 | Fill #8

## 2020-03-25 MED FILL — GENGRAF 25 MG CAPSULE: 30 days supply | Qty: 180 | Fill #8 | Status: AC

## 2020-04-23 NOTE — Unmapped (Signed)
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     Was a Nurse, learning disability used for this call? No    Completed refill call assessment today to schedule patient's medication shipment from the [facility] [phone]).       Specialty medication(s) and dose(s) confirmed: Regimen is correct and unchanged.   Changes to medications: Beaux reports no changes at this time.  Changes to insurance: No  Questions for the pharmacist: No    Confirmed patient received Welcome Packet with first shipment. The patient will receive a drug information handout for each medication shipped and additional FDA Medication Guides as required.       DISEASE/MEDICATION-SPECIFIC INFORMATION        N/A    SPECIALTY MEDICATION ADHERENCE     Medication Adherence    Patient reported X missed doses in the last month: 0  Specialty Medication: Gengraf 25mg   Patient is on additional specialty medications: Yes  Additional Specialty Medications: Gengraf 100mg   Patient Reported Additional Medication X Missed Doses in the Last Month: 0  Patient is on more than two specialty medications: Yes  Specialty Medication: Mycophenolate 250mg   Patient Reported Additional Medication X Missed Doses in the Last Month: 0  Adherence tools used: patient uses a pill box to manage medications  Support network for adherence: family member                Gengraf 25 mg: 5 days of medicine on hand   Gengraf 100 mg: 5 days of medicine on hand   Mycophenolate 250 mg: 5 days of medicine on hand         SHIPPING     Shipping address confirmed in Epic.     Delivery Scheduled: Yes, Expected medication delivery date: 04/24/20. Medication will be delivered via Same Day Courier to the prescription address in Epic WAM.    Tera Helper   [facility] Specialty Pharmacist

## 2020-04-24 DIAGNOSIS — Z940 Kidney transplant status: Principal | ICD-10-CM

## 2020-04-24 DIAGNOSIS — Z79899 Other long term (current) drug therapy: Principal | ICD-10-CM

## 2020-04-24 MED FILL — MYCOPHENOLATE MOFETIL 250 MG CAPSULE: ORAL | 30 days supply | Qty: 120 | Fill #9

## 2020-04-24 MED FILL — MYCOPHENOLATE MOFETIL 250 MG CAPSULE: 30 days supply | Qty: 120 | Fill #9 | Status: AC

## 2020-04-24 MED FILL — GENGRAF 100 MG CAPSULE: ORAL | 30 days supply | Qty: 60 | Fill #9

## 2020-04-24 MED FILL — GENGRAF 25 MG CAPSULE: 30 days supply | Qty: 180 | Fill #9 | Status: AC

## 2020-04-24 MED FILL — GENGRAF 100 MG CAPSULE: 30 days supply | Qty: 60 | Fill #9 | Status: AC

## 2020-04-24 MED FILL — GENGRAF 25 MG CAPSULE: ORAL | 30 days supply | Qty: 180 | Fill #9

## 2020-04-30 ENCOUNTER — Encounter: Admit: 2020-04-30 | Discharge: 2020-05-01 | Payer: MEDICARE

## 2020-04-30 ENCOUNTER — Encounter: Admit: 2020-04-30 | Discharge: 2020-05-01 | Payer: MEDICARE | Attending: Nephrology | Primary: Nephrology

## 2020-04-30 DIAGNOSIS — E872 Acidosis: Principal | ICD-10-CM

## 2020-04-30 DIAGNOSIS — E875 Hyperkalemia: Principal | ICD-10-CM

## 2020-04-30 DIAGNOSIS — I10 Essential (primary) hypertension: Principal | ICD-10-CM

## 2020-04-30 DIAGNOSIS — D849 Immunodeficiency, unspecified: Principal | ICD-10-CM

## 2020-04-30 DIAGNOSIS — Z940 Kidney transplant status: Principal | ICD-10-CM

## 2020-04-30 DIAGNOSIS — E785 Hyperlipidemia, unspecified: Principal | ICD-10-CM

## 2020-04-30 DIAGNOSIS — Z48298 Encounter for aftercare following other organ transplant: Principal | ICD-10-CM

## 2020-04-30 DIAGNOSIS — Z7982 Long term (current) use of aspirin: Principal | ICD-10-CM

## 2020-04-30 DIAGNOSIS — E1121 Type 2 diabetes mellitus with diabetic nephropathy: Principal | ICD-10-CM

## 2020-04-30 DIAGNOSIS — E7800 Pure hypercholesterolemia, unspecified: Principal | ICD-10-CM

## 2020-04-30 DIAGNOSIS — Z79899 Other long term (current) drug therapy: Principal | ICD-10-CM

## 2020-04-30 LAB — CBC W/ AUTO DIFF
BASOPHILS ABSOLUTE COUNT: 0 10*9/L (ref 0.0–0.1)
BASOPHILS RELATIVE PERCENT: 0.8 %
EOSINOPHILS ABSOLUTE COUNT: 0 10*9/L (ref 0.0–0.7)
EOSINOPHILS RELATIVE PERCENT: 1.1 %
HEMATOCRIT: 36.5 % — ABNORMAL LOW (ref 38.0–50.0)
HEMOGLOBIN: 11.8 g/dL — ABNORMAL LOW (ref 13.5–17.5)
LYMPHOCYTES ABSOLUTE COUNT: 0.7 10*9/L (ref 0.7–4.0)
LYMPHOCYTES RELATIVE PERCENT: 18 %
MEAN CORPUSCULAR HEMOGLOBIN CONC: 32.4 g/dL (ref 30.0–36.0)
MEAN CORPUSCULAR HEMOGLOBIN: 24.8 pg — ABNORMAL LOW (ref 26.0–34.0)
MEAN CORPUSCULAR VOLUME: 76.7 fL — ABNORMAL LOW (ref 81.0–95.0)
MEAN PLATELET VOLUME: 7.9 fL (ref 7.0–10.0)
MONOCYTES ABSOLUTE COUNT: 0.4 10*9/L (ref 0.1–1.0)
MONOCYTES RELATIVE PERCENT: 10.6 %
NEUTROPHILS ABSOLUTE COUNT: 2.7 10*9/L (ref 1.7–7.7)
NEUTROPHILS RELATIVE PERCENT: 69.5 %
NUCLEATED RED BLOOD CELLS: 0 /100{WBCs} (ref <=4)
PLATELET COUNT: 209 10*9/L (ref 150–450)
RED BLOOD CELL COUNT: 4.75 10*12/L (ref 4.32–5.72)
RED CELL DISTRIBUTION WIDTH: 16.5 % — ABNORMAL HIGH (ref 12.0–15.0)
WBC ADJUSTED: 3.8 10*9/L (ref 3.5–10.5)

## 2020-04-30 LAB — LIPID PANEL
CHOLESTEROL/HDL RATIO SCREEN: 5.2 — ABNORMAL HIGH (ref 1.0–4.5)
CHOLESTEROL: 202 mg/dL — ABNORMAL HIGH (ref <=200)
HDL CHOLESTEROL: 39 mg/dL — ABNORMAL LOW (ref 40–60)
LDL CHOLESTEROL CALCULATED: 135 mg/dL — ABNORMAL HIGH (ref 40–99)
NON-HDL CHOLESTEROL: 163 mg/dL — ABNORMAL HIGH (ref 70–130)
TRIGLYCERIDES: 142 mg/dL (ref 0–150)
VLDL CHOLESTEROL CAL: 28.4 mg/dL (ref 12–42)

## 2020-04-30 LAB — URINALYSIS
BILIRUBIN UA: NEGATIVE
BLOOD UA: NEGATIVE
GLUCOSE UA: NEGATIVE
KETONES UA: NEGATIVE
LEUKOCYTE ESTERASE UA: NEGATIVE
NITRITE UA: NEGATIVE
PH UA: 5.5 (ref 5.0–9.0)
PROTEIN UA: NEGATIVE
RBC UA: <1 /HPF (ref <3)
RENAL TUBULAR EPITHELIAL CELLS: 2 /HPF — ABNORMAL HIGH
SPECIFIC GRAVITY UA: 1.02 (ref 1.005–1.030)
SQUAMOUS EPITHELIAL: 6 /HPF — ABNORMAL HIGH (ref 0–5)
UROBILINOGEN UA: 2
WBC UA: <1 /HPF (ref <2)

## 2020-04-30 LAB — HEPATIC FUNCTION PANEL
ALBUMIN: 3.9 g/dL (ref 3.4–5.0)
ALKALINE PHOSPHATASE: 65 U/L (ref 46–116)
ALT (SGPT): 15 U/L (ref 10–49)
AST (SGOT): 15 U/L (ref <=34)
BILIRUBIN DIRECT: 0.2 mg/dL (ref 0.00–0.30)
BILIRUBIN TOTAL: 0.5 mg/dL (ref 0.3–1.2)
PROTEIN TOTAL: 7.4 g/dL (ref 5.7–8.2)

## 2020-04-30 LAB — RENAL FUNCTION PANEL
ALBUMIN: 3.9 g/dL (ref 3.4–5.0)
ANION GAP: 7 mmol/L (ref 5–14)
BLOOD UREA NITROGEN: 54 mg/dL — ABNORMAL HIGH (ref 9–23)
BUN / CREAT RATIO: 24
CALCIUM: 10.2 mg/dL (ref 8.7–10.4)
CHLORIDE: 108 mmol/L — ABNORMAL HIGH (ref 98–107)
CO2: 21.9 mmol/L (ref 20.0–31.0)
CREATININE: 2.27 mg/dL — ABNORMAL HIGH
EGFR CKD-EPI AA MALE: 34 mL/min/{1.73_m2} — ABNORMAL LOW (ref >=60)
EGFR CKD-EPI NON-AA MALE: 29 mL/min/{1.73_m2} — ABNORMAL LOW (ref >=60)
GLUCOSE RANDOM: 101 mg/dL (ref 70–179)
PHOSPHORUS: 3.9 mg/dL (ref 2.4–5.1)
POTASSIUM: 5.3 mmol/L — ABNORMAL HIGH (ref 3.4–4.5)
SODIUM: 137 mmol/L (ref 135–145)

## 2020-04-30 LAB — PROTEIN / CREATININE RATIO, URINE
CREATININE, URINE: 50 mg/dL
PROTEIN URINE: 22.8 mg/dL
PROTEIN/CREAT RATIO, URINE: 0.456

## 2020-04-30 LAB — MAGNESIUM: MAGNESIUM: 1.3 mg/dL — ABNORMAL LOW (ref 1.6–2.6)

## 2020-04-30 LAB — PARATHYROID HORMONE (PTH): PARATHYROID HORMONE INTACT: 182.7 pg/mL — ABNORMAL HIGH (ref 18.4–80.1)

## 2020-04-30 LAB — HEMOGLOBIN A1C
ESTIMATED AVERAGE GLUCOSE: 123 mg/dL
HEMOGLOBIN A1C: 5.9 % — ABNORMAL HIGH (ref 4.8–5.6)

## 2020-04-30 MED ORDER — ENALAPRIL MALEATE 10 MG TABLET
ORAL_TABLET | Freq: Two times a day (BID) | ORAL | 3 refills | 90 days | Status: CP
Start: 2020-04-30 — End: 2020-05-20

## 2020-04-30 NOTE — Unmapped (Signed)
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   Spent approx 10 minutes with Andrew Diaz during clinic visit. Pt doing well with no complaints. Pt has RUS and CXR scheduled for after visit today. Pt will get labs after clinic visit this morning. Pt has not taken his medications yet this morning.   Pt denies any complaints or needs at this time.       Assessment  BP: 150's/70's  Lightheaded: denies  Headache: denies  Hand tremors: denies  Numbness/tingling: denies  Fevers: denies  Chills/sweats: denies  Shortness of breath: denies  Chest pain or pressure: denies  Palpitations: denies  Abdominal pain: denies  Heart burn: denies  Nausea/vomiting: denies  Diarrhea/constipation: denies  UTI symptoms: denies  Swelling: denies    Good appetite; reports adequate hydration.     Any new medications? denies  Immunosuppressant last taken: 04/29/2020    Immunization status: Covid x 3   Flu vaccine for this season received    Functional Score: 90     Able to carry on normal activity;  Minor signs or symptoms of disease.      I spent a total of 10 minutes with Andrew Diaz reviewing medications and symptoms.

## 2020-04-30 NOTE — Unmapped (Signed)
AOBP performed R arm, medium cuff.    Average - 169/69 p65  1st Reading - 179/71 p65  2nd Reading - 175/69 p65  3rd Reading- 154/66 p66

## 2020-04-30 NOTE — Unmapped (Signed)
Transplant Nephrology??Clinic??Visit  ??    Assessment and Plan    Andrew Diaz is a 64 y.o. 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  Serum creatinine is 2.27 mg/dL  (baseline range 1.6-1.0 mg/dL).Though he has class I DSAs he has no history of rejection (most recent DSA to HLA A1 (MFI 2875) and HLA B8 (MFI 4471) on 10/17/19.  BK viral load was undetectable 10/17/19.  UP/C is 0.456 consistent with recent values that are all <1.0. At this time I do not feel a biopsy is warranted.    Immunosuppression management. Cyclosporine level today of 430 is not a trough. He will continue the current cyclosporine dose of 175 mg bid targeting cyclosporine trough levels of approximately 75-125. He will also continue CellCept 500 mg BID.     Systolic hypertension, uncontrolled.  I will increase enalapril to 20 mg bid today from 10 mg bid and he will continue other antihypertensive medication doses unchanged.      History of UTI.   He has no current symptoms of UTI and UA does not suggest a UTI.     Hyperlipidemia.   He was intolerant of multiple statin trials in the past due to  myalgias.     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   K today is 5.3 and CO2 21.9. He will continue sodium bicarbonate 650 mg TID for acidosis and hydrochlorothiazide and furosemide for K and BP control. His K will be monitored on the higher dose of enalapril.     Diabetes mellitus, diet controlled. A1C is 5.9. No further treatment is warranted.       Cancer screens. Patient had a colonoscopy revealing no polyps in 2016, follow up is now due (2021). He will schedule a colonoscopy in [place]. Renal US today reveals no renal masses. PSA was normal on 10/17/19. He has no symptoms of prostate disease.      Immunizations.   Patient has received 2 doses of Shingrix (10/2016, 04/2017). Flu vaccine 03/03/20. COVID-19 vaccine (Moderna) x 3 completed with dose #3 02/29/2020.He is aware of the limited efficacy of COVID-19 vaccination.     Follow-up.   He will be seen again in 6 months.     History of Present Illness    Transplant History:  Andrew Diaz is 64 y.o.and received a DCD kidney transplant on 11/12/2006. 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 He also has a history of a class I donor specific antibodies, but has no history of rejection or significant proteinuria with baseline serum creatinine 1.6-2.4 mg/dL, but over the past 18 months creatinine a baseline of 2.3-2.7.     Interval History:  He is without complaints today. He denies dysuria or allograft tenderness. He denies chest pain, shortness of breath or edema. Home BP's are in the 140-160 range systolic and in the 70's diastolic. His prescription for enalapril was recently inadvertently reduced to 10 mg bid from 20 mg bid. Blood glucoses have been in the 100s.       He denies contact with COVID-19 people and has been practicing recommended infection prevention methods. He has completed 3 doses of Moderna vaccine. He denies nausea, vomiting or diarrhea.     Review of Systems     All other systems are reviewed and are negative.  A 10 systems review was completed.    Medications    Current Outpatient Medications   Medication Sig Dispense Refill   ??? aspirin, buffered 81 mg Tab Take 81 mg by mouth. Frequency:QD   Dosage:81   MG  Instructions:  Note:Dose: 81MG      ??? cholecalciferol, vitamin D3, (VITAMIN D3) 2,000 unit cap Take 2,000 Units by mouth every other day.     ??? cycloSPORINE modified (GENGRAF) 100 MG capsule Take 1 capsule by mouth twice daily 60 capsule 11   ??? cycloSPORINE modified (GENGRAF) 25 MG capsule Take 3 capsules (75 mg total) by mouth two (2) times a day. 180 capsule 11   ??? docusate sodium (COLACE) 100 MG capsule Take 100 mg by mouth two (2) times a day as needed. Frequency:BID   Dosage:100   MG  Instructions:  Note:Dose: 100MG      ??? enalapril (VASOTEC) 10 MG tablet Take 2 tablets (20 mg total) by mouth Two (2) times a day. 360 tablet 3   ??? furosemide (LASIX) 20 MG tablet Take 1 tablet (20 mg total) by mouth daily. 90 tablet 3   ??? hydrALAZINE (APRESOLINE) 25 MG tablet TAKE 3 TABLETS (75 MG TOTAL) BY MOUTH THREE (3) TIMES A DAY. 810 tablet 2   ??? hydroCHLOROthiazide (HYDRODIURIL) 50 MG tablet TAKE 1 TABLET BY MOUTH EVERY DAY 90 tablet 3   ??? lancets Misc (E11.39); NPI: [id number]. Check blood sugar 3 times daily before meals 100 each 11   ??? MEDICAL SUPPLY ITEM (E11.39); NPI: [id number]. Check blood sugar 3 times daily before meals 1 Package 0   ??? metoprolol tartrate (LOPRESSOR) 50 MG tablet TAKE 1 TABLET (50 MG TOTAL) BY MOUTH TWO (2) TIMES A DAY. 180 tablet 3   ??? mycophenolate (CELLCEPT) 250 mg capsule Take 2 capsules (500 mg total) by mouth two (2) times a day. 120 capsule 11   ??? omeprazole (PRILOSEC) 40 MG capsule TAKE 1 CAPSULE (40 MG TOTAL) BY MOUTH DAILY. 90 capsule 3   ??? sodium bicarbonate 650 mg tablet TAKE 1 TABLET (650 MG TOTAL) BY MOUTH THREE (3) TIMES A DAY. 270 tablet 2   ??? sodium polystyrene, SPS, with sorbitol 15-20 gram/60 mL Susp Take 15 Gm daily as needed  For elevated potassium level. (Patient not taking: Reported on 07/02/2019) 360 mL 2   ??? traMADoL (ULTRAM) 50 mg tablet TAKE 1 TABLET BY MOUTH EVERY 6 HOURS AS NEEDED FOR PAIN 28 tablet 3     No current facility-administered medications for this visit.       Physical Exam    Temp 36.1 ??C (97 ??F) (Temporal)  - Ht 188 cm (6' 2)  - Wt 94.3 kg (207 lb 12.8 oz)  - BMI 26.68 kg/m??     General: Patient is a pleasant male in no apparent distress.  Eyes: Sclera anicteric.  Neck: Supple without LAD/JVD/bruits.  Lungs: Clear to auscultation bilaterally, no wheezes/rales/rhonchi.  Cardiovascular: Grade 1/6 systolic murmur  Abdomen: Soft, notender/nondistended. Positive bowel sounds. No hepatosplenomegaly, masses or bruits appreciated.  Extremities: no edema.   Skin: Without rash  Neurological: Grossly nonfocal.  Psychiatric: Mood and affect appropriate.    Laboratory Results    Recent Results (from the past 170 hour(s))   Parathyroid Hormone (PTH)    Collection Time: 04/30/20  9:55 AM   Result Value Ref Range    PTH 182.7 (H) 18.4 - 80.1 pg/mL   Vitamin D 25 Hydroxy (25OH D2 + D3)    Collection Time:  04/30/20  9:55 AM   Result Value Ref Range    Vitamin D Total (25OH) 28.9 20.0 - 80.0 ng/mL   Lipid panel    Collection Time: 04/30/20  9:55 AM   Result Value Ref Range    Triglycerides 142 0 - 150 mg/dL    Cholesterol 098 (H) <=200 mg/dL    HDL 39 (L) 40 - 60 mg/dL    LDL Calculated 119 (H) 40 - 99 mg/dL    VLDL Cholesterol Cal 28.4 12 - 42 mg/dL    Chol/HDL Ratio 5.2 (H) 1.0 - 4.5    Non-HDL Cholesterol 163 (H) 70 - 130 mg/dL    FASTING Unknown    Hepatic Function Panel    Collection Time: 04/30/20  9:55 AM   Result Value Ref Range    Albumin 3.9 3.4 - 5.0 g/dL    Total Protein 7.4 5.7 - 8.2 g/dL    Total Bilirubin 0.5 0.3 - 1.2 mg/dL    Bilirubin, Direct 1.47 0.00 - 0.30 mg/dL    AST 15 <=82 U/L    ALT 15 10 - 49 U/L    Alkaline Phosphatase 65 46 - 116 U/L   Hemoglobin A1c    Collection Time: 04/30/20  9:55 AM   Result Value Ref Range    Hemoglobin A1C 5.9 (H) 4.8 - 5.6 %    Estimated Average Glucose 123 mg/dL   CMV DNA, quantitative, PCR    Collection Time: 04/30/20  9:55 AM   Result Value Ref Range    CMV Viral Ld Detected (A) Not Detected    CMV Quant <50 (H) <0 IU/mL    CMV Quant Log10      CMV Comment     Cyclosporine, Trough    Collection Time: 04/30/20  9:55 AM   Result Value Ref Range    Cyclosporine, Trough 430 (H) 100 - 400 ng/mL   Renal Function Panel    Collection Time: 04/30/20  9:55 AM   Result Value Ref Range    Sodium 137 135 - 145 mmol/L    Potassium 5.3 (H) 3.4 - 4.5 mmol/L    Chloride 108 (H) 98 - 107 mmol/L    CO2 21.9 20.0 - 31.0 mmol/L    Anion Gap 7 5 - 14 mmol/L    BUN 54 (H) 9 - 23 mg/dL    Creatinine 9.56 (H) 0.60 - 1.10 mg/dL    BUN/Creatinine Ratio 24     EGFR CKD-EPI Non-African American, Male 29 (L) >=60 mL/min/1.25m2    EGFR CKD-EPI African American, Male 34 (L) >=60 mL/min/1.22m2    Glucose 101 70 - 179 mg/dL    Calcium 21.3 8.7 - 08.6 mg/dL    Phosphorus 3.9 2.4 - 5.1 mg/dL    Albumin 3.9 3.4 - 5.0 g/dL   Magnesium Level    Collection Time: 04/30/20  9:55 AM   Result Value Ref Range    Magnesium 1.3 (L) 1.6 - 2.6 mg/dL   CBC w/ Differential    Collection Time: 04/30/20  9:55 AM   Result Value Ref Range    WBC 3.8 3.5 - 10.5 10*9/L    RBC 4.75 4.32 - 5.72 10*12/L    HGB 11.8 (L) 13.5 - 17.5 g/dL    HCT 57.8 (L) 46.9 - 50.0 %    MCV 76.7 (L) 81.0 - 95.0 fL    MCH 24.8 (L) 26.0 - 34.0 pg    MCHC 32.4 30.0 - 36.0 g/dL    RDW  16.5 (H) 12.0 - 15.0 %    MPV 7.9 7.0 - 10.0 fL    Platelet 209 150 - 450 10*9/L    nRBC 0 <=4 /100 WBCs    Neutrophils % 69.5 %    Lymphocytes % 18.0 %    Monocytes % 10.6 %    Eosinophils % 1.1 %    Basophils % 0.8 %    Absolute Neutrophils 2.7 1.7 - 7.7 10*9/L    Absolute Lymphocytes 0.7 0.7 - 4.0 10*9/L    Absolute Monocytes 0.4 0.1 - 1.0 10*9/L    Absolute Eosinophils 0.0 0.0 - 0.7 10*9/L    Absolute Basophils 0.0 0.0 - 0.1 10*9/L   Cytology-Urine    Collection Time: 04/30/20 10:20 AM   Result Value Ref Range    Final Diagnosis       A: Urine, voided  No malignant cells identified  No decoy cells identified     This electronic signature is attestation that the pathologist personally reviewed the submitted material(s) and the final diagnosis reflects that evaluation.      Clinical History       History of kidney transplant      Gross Description            60 ml clear yellow fluid, unfixed    Materials Prepared & Examined    Smear Slides.......0  Monolayers..........1  Cytospins.............0  Cell Blocks...........0  Core Biopsy.........0  Touch Prep..........0      Microscopic Description       Microscopic examination substantiates the above diagnosis.    Resident Physician: None Assigned      EMBEDDED IMAGES      Specimen Adequacy Satisfactory for evaluation     Disclaimer       Unless otherwise specified, specimens are preserved using 10% neutral buffered formalin. For cases in which immunohistochemical and/or in-situ hybridization stains are performed, the following statement applies: Appropriate controls for each stain (positive controls with or without negative controls) have been evaluated and stain as expected. These stains have not been separately validated for use on decalcified specimens and should be interpreted with caution in that setting. Some of the reagents used for these stains may be classified as analyte specific reagents (ASR). Tests using ASRs were developed, and their performance characteristics were determined, by the Anatomic Pathology Department [facility]). They have not been cleared or approved by the [place] Food and Drug Administration (FDA). The FDA does not require these tests to go through premarket FDA review. These tests are used for clinical purposes. They should not be regarded as investigational or for research. This laboratory is certified under the Clinical Laboratory Improvement Amendments (CLIA) as qualified to perform high complexity clinical laboratory testing.     Urinalysis    Collection Time: 04/30/20 10:20 AM   Result Value Ref Range    Color, UA Yellow     Clarity, UA Clear     Specific Gravity, UA 1.020 1.005 - 1.030    pH, UA 5.5 5.0 - 9.0    Leukocyte Esterase, UA Negative Negative    Nitrite, UA Negative Negative    Protein, UA Negative Negative    Glucose, UA Negative Negative    Ketones, UA Negative Negative    Urobilinogen, UA 2.0 mg/dL 0.2 - 2.0 mg/dL    Bilirubin, UA Negative Negative    Blood, UA Negative Negative    RBC, UA <1 <3 /HPF    WBC, UA <1 <2 /HPF  Squam Epithel, UA 6 (H) 0 - 5 /HPF    Bacteria, UA Rare (A) None Seen /HPF    Renal Epithel, UA 2 (H) 0 /HPF   Urine Culture    Collection Time: 04/30/20 10:20 AM    Specimen: Clean Catch; Urine   Result Value Ref Range    Urine Culture, Comprehensive NO GROWTH    Protein/Creatinine Ratio, Urine    Collection Time: 04/30/20 10:20 AM   Result Value Ref Range    Creat U 50.0 Undefined mg/dL    Protein, Ur 16.1 mg/dL    Protein/Creatinine Ratio, Urine 0.456 Undefined

## 2020-05-01 LAB — VITAMIN D 25 HYDROXY: VITAMIN D, TOTAL (25OH): 28.9 ng/mL (ref 20.0–80.0)

## 2020-05-01 LAB — CYCLOSPORINE, TROUGH: CYCLOSPORINE, TROUGH: 430 ng/mL — ABNORMAL HIGH (ref 100–400)

## 2020-05-02 LAB — CMV DNA, QUANTITATIVE, PCR
CMV QUANT: <50 [IU]/mL — ABNORMAL HIGH (ref <0)
CMV VIRAL LD: DETECTED — AB

## 2020-05-05 NOTE — Unmapped (Signed)
Attempted to call pt again, no answer, VM full

## 2020-05-05 NOTE — Unmapped (Signed)
Attempted to call pt regarding his last cyclosporine level and when he took medication prior to lab draw- no answer and mailbox full  Will attempt to call pt back later today

## 2020-05-06 LAB — HLA DS POST TRANSPLANT
ANTI-DONOR DRW #1 MFI: 263 MFI
ANTI-DONOR HLA-A #1 MFI: 4344 MFI — ABNORMAL HIGH
ANTI-DONOR HLA-A #2 MFI: 0 MFI
ANTI-DONOR HLA-B #1 MFI: 4177 MFI — ABNORMAL HIGH
ANTI-DONOR HLA-B #2 MFI: 0 MFI
ANTI-DONOR HLA-C #1 MFI: 1007 MFI — ABNORMAL HIGH
ANTI-DONOR HLA-DQB #1 MFI: 183 MFI
ANTI-DONOR HLA-DQB #2 MFI: 204 MFI
ANTI-DONOR HLA-DR #1 MFI: 88 MFI
ANTI-DONOR HLA-DR #2 MFI: 155 MFI

## 2020-05-06 LAB — FSAB CLASS 2 ANTIBODY SPECIFICITY: HLA CL2 AB RESULT: POSITIVE

## 2020-05-06 LAB — FSAB CLASS 1 ANTIBODY SPECIFICITY: HLA CLASS 1 ANTIBODY RESULT: POSITIVE

## 2020-05-06 NOTE — Unmapped (Signed)
Spoke with pt regarding his elevated cyclosporine level. He states that he had taken his medication around 7-715 in the morning and had his labs drawn around 9.   Dr. Margaretmary Bayley made aware

## 2020-05-06 NOTE — Unmapped (Signed)
Called pt to ask about cyclosporine level that was drawn on 04/30/20. VM left for pt to call [facility] back

## 2020-05-07 LAB — VITAMIN D 1,25 DIHYDROXY: VITAMIN D 1,25-DIHYDROXY: 23 pg/mL

## 2020-05-20 DIAGNOSIS — I159 Secondary hypertension, unspecified: Principal | ICD-10-CM

## 2020-05-20 DIAGNOSIS — I10 Essential (primary) hypertension: Principal | ICD-10-CM

## 2020-05-20 DIAGNOSIS — Z940 Kidney transplant status: Principal | ICD-10-CM

## 2020-05-20 MED ORDER — ENALAPRIL MALEATE 10 MG TABLET
ORAL_TABLET | Freq: Two times a day (BID) | ORAL | 0 refills | 90.00000 days | Status: CP
Start: 2020-05-20 — End: 2020-08-18

## 2020-05-20 MED ORDER — FUROSEMIDE 20 MG TABLET
ORAL_TABLET | Freq: Every day | ORAL | 3 refills | 90 days | Status: CP
Start: 2020-05-20 — End: ?

## 2020-05-20 MED ORDER — CYCLOSPORINE MODIFIED 100 MG CAPSULE
ORAL_CAPSULE | Freq: Two times a day (BID) | ORAL | 3 refills | 90 days | Status: CP
Start: 2020-05-20 — End: 2021-05-20

## 2020-05-20 MED ORDER — CYCLOSPORINE MODIFIED 25 MG CAPSULE
ORAL_CAPSULE | Freq: Two times a day (BID) | ORAL | 3 refills | 90 days | Status: CP
Start: 2020-05-20 — End: 2021-05-20

## 2020-05-20 MED ORDER — MYCOPHENOLATE MOFETIL 250 MG CAPSULE
ORAL_CAPSULE | Freq: Two times a day (BID) | ORAL | 3 refills | 90 days | Status: CP
Start: 2020-05-20 — End: 2021-05-20

## 2020-05-20 MED ORDER — HYDROCHLOROTHIAZIDE 50 MG TABLET
ORAL_TABLET | Freq: Every day | ORAL | 0 refills | 90 days | Status: CP
Start: 2020-05-20 — End: 2020-08-18

## 2020-05-20 MED ORDER — HYDRALAZINE 25 MG TABLET
ORAL_TABLET | Freq: Three times a day (TID) | ORAL | 0 refills | 90 days | Status: CP
Start: 2020-05-20 — End: 2020-08-18

## 2020-05-20 MED ORDER — SODIUM BICARBONATE 650 MG TABLET
ORAL_TABLET | Freq: Three times a day (TID) | ORAL | 3 refills | 90.00000 days | Status: CP
Start: 2020-05-20 — End: 2021-05-21

## 2020-05-20 MED ORDER — METOPROLOL TARTRATE 50 MG TABLET
ORAL_TABLET | Freq: Two times a day (BID) | ORAL | 0 refills | 90.00000 days | Status: CP
Start: 2020-05-20 — End: 2020-06-09

## 2020-05-20 MED ORDER — CHOLECALCIFEROL (VITAMIN D3) 50 MCG (2,000 UNIT) CAPSULE
ORAL_CAPSULE | ORAL | 0 refills | 90 days | Status: CP
Start: 2020-05-20 — End: 2020-08-18

## 2020-05-20 MED ORDER — OMEPRAZOLE 40 MG CAPSULE,DELAYED RELEASE
ORAL_CAPSULE | Freq: Every day | ORAL | 3 refills | 90.00000 days | Status: CP
Start: 2020-05-20 — End: ?

## 2020-05-20 NOTE — Unmapped (Signed)
Per Pt's request, all medications sent to [facility] mail delivery service

## 2020-05-24 NOTE — Unmapped (Signed)
This patient has been disenrolled from the [facility] specialty pharmacy services due to a pharmacy change. The patient is now filling at [facility]. Pt spoke with team member TK today to verify this. He is aware to contact us with any needs in the future. clinic is also aware, new rxs already sent to new pharmacy.    Thad Ranger  [facility] Specialty Pharmacist

## 2020-06-07 DIAGNOSIS — Z940 Kidney transplant status: Principal | ICD-10-CM

## 2020-06-07 DIAGNOSIS — I10 Essential (primary) hypertension: Principal | ICD-10-CM

## 2020-06-07 MED ORDER — METOPROLOL TARTRATE 50 MG TABLET
ORAL_TABLET | Freq: Two times a day (BID) | ORAL | 3 refills | 0 days
Start: 2020-06-07 — End: ?

## 2020-06-09 MED ORDER — METOPROLOL TARTRATE 50 MG TABLET
ORAL_TABLET | Freq: Two times a day (BID) | ORAL | 3 refills | 90 days | Status: CP
Start: 2020-06-09 — End: 2020-09-07

## 2020-06-20 DIAGNOSIS — E118 Type 2 diabetes mellitus with unspecified complications: Principal | ICD-10-CM

## 2020-06-20 MED ORDER — LANCETS 33 GAUGE
Freq: Every day | 12 refills | 0 days | Status: CP
Start: 2020-06-20 — End: ?

## 2020-06-20 MED ORDER — TRUE METRIX GLUCOSE TEST STRIP
ORAL_STRIP | Freq: Once | 12 refills | 0.00000 days | Status: CP
Start: 2020-06-20 — End: 2020-06-20

## 2020-06-20 MED ORDER — BLOOD-GLUCOSE METER KIT WRAPPER
0 refills | 0 days | Status: CP
Start: 2020-06-20 — End: 2021-06-20

## 2020-06-20 MED ORDER — TRUE METRIX LEVEL 1 SOLUTION
12 refills | 0 days | Status: CP
Start: 2020-06-20 — End: ?

## 2020-07-29 DIAGNOSIS — Z940 Kidney transplant status: Principal | ICD-10-CM

## 2020-07-29 DIAGNOSIS — I159 Secondary hypertension, unspecified: Principal | ICD-10-CM

## 2020-07-29 DIAGNOSIS — I10 Essential (primary) hypertension: Principal | ICD-10-CM

## 2020-07-29 MED ORDER — HYDROCHLOROTHIAZIDE 50 MG TABLET
ORAL_TABLET | 0 refills | 0 days
Start: 2020-07-29 — End: ?

## 2020-07-29 MED ORDER — HYDRALAZINE 25 MG TABLET
ORAL_TABLET | 0 refills | 0.00000 days
Start: 2020-07-29 — End: ?

## 2020-07-30 MED ORDER — CHOLECALCIFEROL (VITAMIN D3) 50 MCG (2,000 UNIT) CAPSULE
ORAL_CAPSULE | 0 refills | 0.00000 days | Status: CP
Start: 2020-07-30 — End: ?

## 2020-07-30 MED ORDER — HYDROCHLOROTHIAZIDE 50 MG TABLET
ORAL_TABLET | 0 refills | 0.00000 days | Status: CP
Start: 2020-07-30 — End: ?

## 2020-09-23 MED ORDER — PEG 3350-ELECTROLYTES 236 GRAM-22.74 GRAM-6.74 GRAM-5.86 GRAM SOLUTION
Freq: Once | ORAL | 0 refills | 1.00000 days | Status: CP
Start: 2020-09-23 — End: 2020-09-23

## 2020-09-29 MED FILL — PEG 3350-ELECTROLYTES 236 GRAM-22.74 GRAM-6.74 GRAM-5.86 GRAM SOLUTION: ORAL | 1 days supply | Qty: 4000 | Fill #0

## 2020-10-16 DIAGNOSIS — Z79899 Other long term (current) drug therapy: Principal | ICD-10-CM

## 2020-10-16 DIAGNOSIS — Z940 Kidney transplant status: Principal | ICD-10-CM

## 2020-10-22 ENCOUNTER — Ambulatory Visit: Admit: 2020-10-22 | Discharge: 2020-10-23

## 2020-10-22 ENCOUNTER — Ambulatory Visit: Admit: 2020-10-22 | Discharge: 2020-10-23 | Attending: Nephrology | Primary: Nephrology

## 2020-10-22 DIAGNOSIS — E118 Type 2 diabetes mellitus with unspecified complications: Principal | ICD-10-CM

## 2020-10-22 DIAGNOSIS — Z940 Kidney transplant status: Principal | ICD-10-CM

## 2020-10-22 DIAGNOSIS — I10 Essential (primary) hypertension: Principal | ICD-10-CM

## 2020-10-22 DIAGNOSIS — Z79899 Other long term (current) drug therapy: Principal | ICD-10-CM

## 2020-10-22 DIAGNOSIS — D849 Immunodeficiency, unspecified: Principal | ICD-10-CM

## 2020-10-22 DIAGNOSIS — S63501S Unspecified sprain of right wrist, sequela: Principal | ICD-10-CM

## 2020-10-22 MED ORDER — METOPROLOL TARTRATE 50 MG TABLET
ORAL_TABLET | Freq: Two times a day (BID) | ORAL | 3 refills | 90 days | Status: CP
Start: 2020-10-22 — End: 2021-01-20

## 2020-10-22 MED ORDER — TRAMADOL 50 MG TABLET
ORAL_TABLET | Freq: Four times a day (QID) | ORAL | 3 refills | 7 days | Status: CP | PRN
Start: 2020-10-22 — End: ?

## 2020-10-22 MED ORDER — ENALAPRIL MALEATE 20 MG TABLET
ORAL_TABLET | Freq: Two times a day (BID) | ORAL | 11 refills | 30 days | Status: CP
Start: 2020-10-22 — End: 2021-10-22

## 2020-10-22 MED ORDER — ENALAPRIL MALEATE 10 MG TABLET
ORAL_TABLET | Freq: Two times a day (BID) | ORAL | 3 refills | 90 days | Status: CP
Start: 2020-10-22 — End: 2020-10-22

## 2020-11-05 MED ORDER — OMEPRAZOLE 40 MG CAPSULE,DELAYED RELEASE
ORAL_CAPSULE | 3 refills | 0 days | Status: CP
Start: 2020-11-05 — End: ?

## 2020-12-11 ENCOUNTER — Ambulatory Visit: Admit: 2020-12-11 | Payer: MEDICARE

## 2020-12-14 DIAGNOSIS — Z940 Kidney transplant status: Principal | ICD-10-CM

## 2020-12-14 MED ORDER — SODIUM BICARBONATE 650 MG TABLET
ORAL_TABLET | Freq: Three times a day (TID) | ORAL | 2 refills | 0 days
Start: 2020-12-14 — End: ?

## 2020-12-15 MED ORDER — SODIUM BICARBONATE 650 MG TABLET
ORAL_TABLET | Freq: Three times a day (TID) | ORAL | 2 refills | 90 days | Status: CP
Start: 2020-12-15 — End: 2021-12-16

## 2021-01-02 DIAGNOSIS — Z940 Kidney transplant status: Principal | ICD-10-CM

## 2021-01-12 DIAGNOSIS — Z940 Kidney transplant status: Principal | ICD-10-CM

## 2021-01-12 DIAGNOSIS — I10 Essential (primary) hypertension: Principal | ICD-10-CM

## 2021-01-12 MED ORDER — HYDROCHLOROTHIAZIDE 50 MG TABLET
ORAL_TABLET | 3 refills | 0 days
Start: 2021-01-12 — End: ?

## 2021-01-13 DIAGNOSIS — Z940 Kidney transplant status: Principal | ICD-10-CM

## 2021-01-13 DIAGNOSIS — I159 Secondary hypertension, unspecified: Principal | ICD-10-CM

## 2021-01-13 DIAGNOSIS — I10 Essential (primary) hypertension: Principal | ICD-10-CM

## 2021-01-13 MED ORDER — METOPROLOL TARTRATE 50 MG TABLET
ORAL_TABLET | 3 refills | 0 days
Start: 2021-01-13 — End: ?

## 2021-01-13 MED ORDER — HYDRALAZINE 25 MG TABLET
ORAL_TABLET | 0 refills | 0.00000 days
Start: 2021-01-13 — End: ?

## 2021-01-13 MED ORDER — HYDROCHLOROTHIAZIDE 50 MG TABLET
ORAL_TABLET | 3 refills | 0.00000 days | Status: CP
Start: 2021-01-13 — End: ?

## 2021-01-14 DIAGNOSIS — Z940 Kidney transplant status: Principal | ICD-10-CM

## 2021-01-14 DIAGNOSIS — I10 Essential (primary) hypertension: Principal | ICD-10-CM

## 2021-01-14 MED ORDER — OMEPRAZOLE 40 MG CAPSULE,DELAYED RELEASE
ORAL_CAPSULE | Freq: Every day | ORAL | 4 refills | 90 days | Status: CP
Start: 2021-01-14 — End: ?

## 2021-01-14 MED ORDER — FUROSEMIDE 20 MG TABLET
ORAL_TABLET | Freq: Every day | ORAL | 4 refills | 90 days | Status: CP
Start: 2021-01-14 — End: ?

## 2021-01-14 MED ORDER — METOPROLOL TARTRATE 50 MG TABLET
ORAL_TABLET | Freq: Two times a day (BID) | ORAL | 3 refills | 90.00000 days | Status: CP
Start: 2021-01-14 — End: 2021-04-14

## 2021-01-15 DIAGNOSIS — E118 Type 2 diabetes mellitus with unspecified complications: Principal | ICD-10-CM

## 2021-01-15 MED ORDER — LANCETS 33 GAUGE
Freq: Every day | 12 refills | 0 days | Status: CP
Start: 2021-01-15 — End: ?

## 2021-01-15 MED ORDER — HYDRALAZINE 25 MG TABLET
ORAL_TABLET | ORAL | 0 refills | 0.00000 days | Status: CP
Start: 2021-01-15 — End: ?

## 2021-01-15 MED ORDER — TRUE METRIX LEVEL 1 SOLUTION
12 refills | 0 days | Status: CP
Start: 2021-01-15 — End: ?

## 2021-01-15 MED ORDER — METOPROLOL TARTRATE 50 MG TABLET
ORAL_TABLET | ORAL | 3 refills | 0.00000 days | Status: CP
Start: 2021-01-15 — End: ?

## 2021-01-15 MED ORDER — HYDROCHLOROTHIAZIDE 50 MG TABLET
ORAL_TABLET | 3 refills | 0 days | Status: CP
Start: 2021-01-15 — End: ?

## 2021-01-19 DIAGNOSIS — I10 Essential (primary) hypertension: Principal | ICD-10-CM

## 2021-01-19 MED ORDER — ENALAPRIL MALEATE 20 MG TABLET
ORAL_TABLET | Freq: Two times a day (BID) | ORAL | 4 refills | 90.00000 days | Status: CP
Start: 2021-01-19 — End: 2021-04-19

## 2021-02-01 DIAGNOSIS — Z940 Kidney transplant status: Principal | ICD-10-CM

## 2021-02-01 MED ORDER — FUROSEMIDE 20 MG TABLET
ORAL_TABLET | 3 refills | 0.00000 days
Start: 2021-02-01 — End: ?

## 2021-02-05 MED ORDER — FUROSEMIDE 20 MG TABLET
ORAL_TABLET | ORAL | 3 refills | 0.00000 days | Status: CP
Start: 2021-02-05 — End: ?

## 2021-03-20 DIAGNOSIS — Z79899 Other long term (current) drug therapy: Principal | ICD-10-CM

## 2021-03-20 DIAGNOSIS — Z940 Kidney transplant status: Principal | ICD-10-CM

## 2021-04-08 ENCOUNTER — Ambulatory Visit: Admit: 2021-04-08 | Discharge: 2021-04-08 | Payer: MEDICARE | Attending: Nephrology | Primary: Nephrology

## 2021-04-08 ENCOUNTER — Ambulatory Visit: Admit: 2021-04-08 | Discharge: 2021-04-08 | Payer: MEDICARE

## 2021-04-08 DIAGNOSIS — I10 Essential (primary) hypertension: Principal | ICD-10-CM

## 2021-04-08 DIAGNOSIS — Z79899 Other long term (current) drug therapy: Principal | ICD-10-CM

## 2021-04-08 DIAGNOSIS — Z48298 Encounter for aftercare following other organ transplant: Principal | ICD-10-CM

## 2021-04-08 DIAGNOSIS — E7800 Pure hypercholesterolemia, unspecified: Principal | ICD-10-CM

## 2021-04-08 DIAGNOSIS — Z940 Kidney transplant status: Principal | ICD-10-CM

## 2021-04-08 DIAGNOSIS — D849 Immunodeficiency, unspecified: Principal | ICD-10-CM

## 2021-05-01 DIAGNOSIS — Z940 Kidney transplant status: Principal | ICD-10-CM

## 2021-05-01 MED ORDER — CYCLOSPORINE MODIFIED 25 MG CAPSULE
ORAL_CAPSULE | 3 refills | 0 days | Status: CP
Start: 2021-05-01 — End: ?

## 2021-05-01 MED ORDER — MYCOPHENOLATE MOFETIL 250 MG CAPSULE
ORAL_CAPSULE | 3 refills | 0 days | Status: CP
Start: 2021-05-01 — End: ?

## 2021-05-01 MED ORDER — CYCLOSPORINE MODIFIED 100 MG CAPSULE
ORAL_CAPSULE | ORAL | 3 refills | 0.00000 days | Status: CP
Start: 2021-05-01 — End: ?

## 2021-05-28 ENCOUNTER — Ambulatory Visit: Admit: 2021-05-28 | Discharge: 2021-05-28 | Payer: MEDICARE

## 2021-06-09 ENCOUNTER — Ambulatory Visit: Admit: 2021-06-09 | Discharge: 2021-06-09 | Payer: MEDICARE

## 2021-06-09 DIAGNOSIS — E11311 Type 2 diabetes mellitus with unspecified diabetic retinopathy with macular edema: Principal | ICD-10-CM

## 2021-06-09 DIAGNOSIS — E113593 Type 2 diabetes mellitus with proliferative diabetic retinopathy without macular edema, bilateral: Principal | ICD-10-CM

## 2021-06-09 DIAGNOSIS — H2511 Age-related nuclear cataract, right eye: Principal | ICD-10-CM

## 2021-06-09 DIAGNOSIS — H4311 Vitreous hemorrhage, right eye: Principal | ICD-10-CM

## 2021-06-10 DIAGNOSIS — Z940 Kidney transplant status: Principal | ICD-10-CM

## 2021-06-10 DIAGNOSIS — I159 Secondary hypertension, unspecified: Principal | ICD-10-CM

## 2021-06-10 MED ORDER — HYDRALAZINE 25 MG TABLET
ORAL_TABLET | 0 refills | 0 days
Start: 2021-06-10 — End: ?

## 2021-06-10 MED ORDER — SODIUM BICARBONATE 650 MG TABLET
ORAL_TABLET | 2 refills | 0 days
Start: 2021-06-10 — End: ?

## 2021-06-10 MED ORDER — TRAMADOL 50 MG TABLET
ORAL_TABLET | 0 refills | 0 days
Start: 2021-06-10 — End: ?

## 2021-06-11 MED ORDER — SODIUM BICARBONATE 650 MG TABLET
ORAL_TABLET | 2 refills | 0.00000 days
Start: 2021-06-11 — End: ?

## 2021-06-11 MED ORDER — HYDRALAZINE 25 MG TABLET
ORAL_TABLET | 0 refills | 0 days
Start: 2021-06-11 — End: ?

## 2021-06-11 MED ORDER — TRAMADOL 50 MG TABLET
ORAL_TABLET | 0 refills | 0.00000 days
Start: 2021-06-11 — End: ?

## 2021-07-02 DIAGNOSIS — Z940 Kidney transplant status: Principal | ICD-10-CM

## 2021-07-02 MED ORDER — TRAMADOL 50 MG TABLET
ORAL_TABLET | 0 refills | 0 days
Start: 2021-07-02 — End: ?

## 2021-07-06 MED ORDER — HYDRALAZINE 25 MG TABLET
ORAL_TABLET | 0 refills | 0 days
Start: 2021-07-06 — End: ?

## 2021-07-06 MED ORDER — SODIUM BICARBONATE 650 MG TABLET
ORAL_TABLET | 2 refills | 0 days
Start: 2021-07-06 — End: ?

## 2021-07-06 MED ORDER — TRAMADOL 50 MG TABLET
ORAL_TABLET | ORAL | 0 refills | 0.00000 days | Status: CP
Start: 2021-07-06 — End: ?

## 2021-07-17 MED ORDER — TRAMADOL 50 MG TABLET
ORAL_TABLET | 0 refills | 0 days
Start: 2021-07-17 — End: ?

## 2021-07-17 MED ORDER — HYDRALAZINE 25 MG TABLET
ORAL_TABLET | 0 refills | 0 days
Start: 2021-07-17 — End: ?

## 2021-07-17 MED ORDER — SODIUM BICARBONATE 650 MG TABLET
ORAL_TABLET | 2 refills | 0 days
Start: 2021-07-17 — End: ?

## 2021-09-09 DIAGNOSIS — E118 Type 2 diabetes mellitus with unspecified complications: Principal | ICD-10-CM

## 2021-09-09 MED ORDER — TRUE METRIX GLUCOSE TEST STRIP
ORAL_STRIP | 0 refills | 0 days | Status: CP
Start: 2021-09-09 — End: ?

## 2021-09-13 DIAGNOSIS — Z940 Kidney transplant status: Principal | ICD-10-CM

## 2021-09-13 MED ORDER — SODIUM BICARBONATE 650 MG TABLET
ORAL_TABLET | Freq: Three times a day (TID) | ORAL | 2 refills | 0 days
Start: 2021-09-13 — End: ?

## 2021-09-14 DIAGNOSIS — Z940 Kidney transplant status: Principal | ICD-10-CM

## 2021-09-14 MED ORDER — TRAMADOL 50 MG TABLET
ORAL_TABLET | 0 refills | 0 days | Status: CP
Start: 2021-09-14 — End: ?

## 2021-09-14 MED ORDER — SODIUM BICARBONATE 650 MG TABLET
ORAL_TABLET | Freq: Three times a day (TID) | ORAL | 2 refills | 90 days | Status: CP
Start: 2021-09-14 — End: 2022-09-15

## 2021-10-07 DIAGNOSIS — Z940 Kidney transplant status: Principal | ICD-10-CM

## 2021-10-07 DIAGNOSIS — Z79899 Other long term (current) drug therapy: Principal | ICD-10-CM

## 2021-10-10 ENCOUNTER — Emergency Department
Admission: EM | Admit: 2021-10-10 | Discharge: 2021-10-10 | Disposition: A | Discharge status: Home / Self Care | Payer: Medicare HMO | Attending: Emergency Medicine | Admitting: Emergency Medicine

## 2021-10-10 ENCOUNTER — Emergency Department: Payer: Medicare HMO

## 2021-10-10 ENCOUNTER — Encounter: Payer: Self-pay | Admitting: Emergency Medicine

## 2021-10-10 DIAGNOSIS — J0300 Acute streptococcal tonsillitis, unspecified: Secondary | ICD-10-CM | POA: Diagnosis not present

## 2021-10-10 DIAGNOSIS — R042 Hemoptysis: Secondary | ICD-10-CM | POA: Diagnosis present

## 2021-10-10 LAB — CBC WITH DIFFERENTIAL/PLATELET
Abs Immature Granulocytes: 0.11 10*3/uL — ABNORMAL HIGH (ref 0.00–0.07)
Basophils Absolute: 0.1 10*3/uL (ref 0.0–0.1)
Basophils Relative: 1 %
Eosinophils Absolute: 0.1 10*3/uL (ref 0.0–0.5)
Eosinophils Relative: 1 %
HCT: 39.5 % (ref 39.0–52.0)
Hemoglobin: 11.9 g/dL — ABNORMAL LOW (ref 13.0–17.0)
Immature Granulocytes: 1 %
Lymphocytes Relative: 7 %
Lymphs Abs: 0.9 10*3/uL (ref 0.7–4.0)
MCH: 24.3 pg — ABNORMAL LOW (ref 26.0–34.0)
MCHC: 30.1 g/dL (ref 30.0–36.0)
MCV: 80.8 fL (ref 80.0–100.0)
Monocytes Absolute: 0.8 10*3/uL (ref 0.1–1.0)
Monocytes Relative: 6 %
Neutro Abs: 11.3 10*3/uL — ABNORMAL HIGH (ref 1.7–7.7)
Neutrophils Relative %: 84 %
Platelets: 354 10*3/uL (ref 150–400)
RBC: 4.89 MIL/uL (ref 4.22–5.81)
RDW: 14.9 % (ref 11.5–15.5)
WBC: 13.2 10*3/uL — ABNORMAL HIGH (ref 4.0–10.5)
nRBC: 0 % (ref 0.0–0.2)

## 2021-10-10 LAB — COMPREHENSIVE METABOLIC PANEL
ALT: 13 U/L (ref 0–44)
AST: 16 U/L (ref 15–41)
Albumin: 3.2 g/dL — ABNORMAL LOW (ref 3.5–5.0)
Alkaline Phosphatase: 90 U/L (ref 38–126)
Anion gap: 11 (ref 5–15)
BUN: 52 mg/dL — ABNORMAL HIGH (ref 8–23)
CO2: 24 mmol/L (ref 22–32)
Calcium: 9.7 mg/dL (ref 8.9–10.3)
Chloride: 101 mmol/L (ref 98–111)
Creatinine, Ser: 2.82 mg/dL — ABNORMAL HIGH (ref 0.61–1.24)
GFR, Estimated: 24 mL/min — ABNORMAL LOW (ref >60)
Glucose, Bld: 155 mg/dL — ABNORMAL HIGH (ref 70–99)
Potassium: 4.4 mmol/L (ref 3.5–5.1)
Sodium: 136 mmol/L (ref 135–145)
Total Bilirubin: 1.5 mg/dL — ABNORMAL HIGH (ref 0.3–1.2)
Total Protein: 8.2 g/dL — ABNORMAL HIGH (ref 6.5–8.1)

## 2021-10-10 LAB — SAMPLE TO BLOOD BANK

## 2021-10-10 LAB — LACTIC ACID, PLASMA: Lactic Acid, Venous: 0.9 mmol/L (ref 0.5–1.9)

## 2021-10-10 LAB — GROUP A STREP BY PCR: Group A Strep by PCR: DETECTED — AB

## 2021-10-10 MED ORDER — AMOXICILLIN-POT CLAVULANATE 875-125 MG PO TABS: 1.0000 | ORAL_TABLET | Freq: Two times a day (BID) | ORAL | 0 refills | Status: AC

## 2021-10-10 MED ORDER — METHYLPREDNISOLONE 4 MG PO TBPK: ORAL_TABLET | ORAL | 0 refills | Status: AC

## 2021-10-10 MED ORDER — AMOXICILLIN-POT CLAVULANATE 875-125 MG PO TABS
1.0000 | ORAL_TABLET | Freq: Once | ORAL | Status: AC
  Administered 2021-10-10: 1 via ORAL
  Filled 2021-10-10: qty 1

## 2021-10-10 MED ORDER — PHENOL 1.4 % MT LIQD
2.0000 | OROMUCOSAL | Status: DC | PRN
  Filled 2021-10-10: qty 177

## 2021-10-10 MED ORDER — DEXAMETHASONE SODIUM PHOSPHATE 10 MG/ML IJ SOLN
10.0000 mg | Freq: Once | INTRAMUSCULAR | Status: AC
  Administered 2021-10-10: 10 mg via INTRAVENOUS
  Filled 2021-10-10: qty 1

## 2021-10-10 NOTE — ED Provider Notes (Signed)
[facility] Provider Note   Event Date/Time   First MD Initiated Contact with Patient 10/10/21 1525     (approximate) History  Hemoptysis  HPI Blake Torres is a 66 y.o. male  Location: Throat Duration: 3 days prior to arrival Timing: Worsening since onset Severity: 5/10 Quality: Burning pain Context: Patient states that he has felt swelling, difficulty swallowing, and pain Modifying factors: Swallowing worsens this pain and he denies any relieving factors Associated Symptoms: Hemoptysis ROS: Patient currently denies any vision changes, tinnitus, difficulty speaking, facial droop, chest pain, shortness of breath, abdominal pain, nausea/vomiting/diarrhea, dysuria, or weakness/numbness/paresthesias in any extremity   Physical Exam  Triage Vital Signs: ED Triage Vitals  Enc Vitals Group     BP 10/10/21 1531 (!) 220/89     Pulse Rate 10/10/21 1531 88     Resp 10/10/21 1531 18     Temp 10/10/21 1531 98.6 F (37 C)     Temp Source 10/10/21 1531 Oral     SpO2 10/10/21 1531 100 %     Weight 10/10/21 1526 220 lb (99.8 kg)     Height 10/10/21 1526 6\' 2"  (1.88 m)     Head Circumference --      Peak Flow --      Pain Score 10/10/21 1526 5     Pain Loc --      Pain Edu? --      Excl. in GC? --    Most recent vital signs: Vitals:   10/10/21 1830 10/10/21 1900  BP: (!) 174/83   Pulse: (!) 101   Resp: (!) 23   Temp:  98.8 F (37.1 C)  SpO2: 100%    General: Awake, oriented x4. CV:  Good peripheral perfusion.  Resp:  Normal effort.  Abd:  No distention.  Other:  Elderly African-American male laying in bed in no acute distress.  Significant erythema to the posterior oropharynx with mild stridor appreciated ED Results / Procedures / Treatments  Labs (all labs ordered are listed, but only abnormal results are displayed) Labs Reviewed  GROUP A STREP BY PCR - Abnormal; Notable for the following components:      Result Value   Group A Strep by PCR  DETECTED (*)    All other components within normal limits  COMPREHENSIVE METABOLIC PANEL - Abnormal; Notable for the following components:   Glucose, Bld 155 (*)    BUN 52 (*)    Creatinine, Ser 2.82 (*)    Total Protein 8.2 (*)    Albumin 3.2 (*)    Total Bilirubin 1.5 (*)    GFR, Estimated 24 (*)    All other components within normal limits  CBC WITH DIFFERENTIAL/PLATELET - Abnormal; Notable for the following components:   WBC 13.2 (*)    Hemoglobin 11.9 (*)    MCH 24.3 (*)    Neutro Abs 11.3 (*)    Abs Immature Granulocytes 0.11 (*)    All other components within normal limits  LACTIC ACID, PLASMA  SAMPLE TO BLOOD BANK   EKG ED ECG REPORT I, 12/10/21, the attending physician, personally viewed and interpreted this ECG. Date: 10/10/2021 EKG Time: 1531 Rate: 86 Rhythm: normal sinus rhythm QRS Axis: normal Intervals: normal ST/T Wave abnormalities: normal Narrative Interpretation: no evidence of acute ischemia RADIOLOGY ED MD interpretation: CT soft tissue neck without contrast shows diffusely abnormal appearance of the pharynx including marked enlargement of the right tonsillar region with surrounding edema concern for acute tonsillitis -  Agree with radiology assessment Official radiology report(s): CT SOFT TISSUE NECK WO CONTRAST  Result Date: 10/10/2021 CLINICAL DATA:  Epiglottitis or tonsillitis suspected. Hemoptysis for 1 week. Leukocytosis and positive throat swab for group A strep. EXAM: CT NECK WITHOUT CONTRAST TECHNIQUE: Multidetector CT imaging of the neck was performed following the standard protocol without intravenous contrast. RADIATION DOSE REDUCTION: This exam was performed according to the departmental dose-optimization program which includes automated exposure control, adjustment of the mA and/or kV according to patient size and/or use of iterative reconstruction technique. COMPARISON:  None Available. FINDINGS: Pharynx and larynx: There is masslike,  predominantly low-density enlargement of the right-sided oropharyngeal soft tissues centered in the palatine tonsil measuring approximately 4 x 4 cm on axial images and approximately 6 cm craniocaudal. Less pronounced low-density soft tissue thickening or edema extends superiorly into the nasopharynx on the right as well as inferiorly in the oropharynx and hypopharynx. The soft palate is also involved, and there is milder soft tissue thickening or edema involving the posterior and left lateral walls of the oropharynx. There is mild narrowing of the upper oropharyngeal airway. Edema extends into the right lateral tongue base region, and there is asymmetric effacement of the vallecula on the right. No retropharyngeal fluid collection. Salivary glands: The above described inflammation extends into the right submandibular space. The submandibular glands are symmetric in size. The parotid glands are unremarkable. Thyroid: Unremarkable. Lymph nodes: Borderline enlarged right level II and III lymph nodes measuring up to 1 cm in short axis. Vascular: Moderate calcific atherosclerosis at the right greater than left carotid bifurcations. Limited intracranial: Unremarkable. Visualized orbits: Left cataract extraction. Mastoids and visualized paranasal sinuses: Clear. Skeleton: Absent maxillary dentition. Advanced disc degeneration at C5-6 and C6-7. No suspicious osseous lesion. Upper chest: Small clustered nodular opacities in the left upper lobe. Aortic atherosclerosis. No superior mediastinal lymphadenopathy. Other: None. IMPRESSION: 1. Diffusely abnormal appearance of the pharynx including marked enlargement of the right tonsillar region with surrounding edema as detailed above. Complete characterization is limited on this noncontrast study. Acute tonsillitis is favored with this clinical history, however direct visualization and close clinical follow-up are recommended as neoplasm is possible as well. Limited assessment  for peritonsillar abscess in the absence of IV contrast. 2. Borderline enlarged right level II and III lymph nodes, favored to be reactive. 3. Small clustered left upper lobe pulmonary opacities, likely infectious or inflammatory. 4. Aortic Atherosclerosis (ICD10-I70.0). Electronically Signed   By: Sebastian AcheAllen  Grady M.D.   On: 10/10/2021 17:53   PROCEDURES: Critical Care performed: No .1-3 Lead EKG Interpretation  Performed by: Merwyn KatosBradler, Kellie Murrill K, MD Authorized by: Merwyn KatosBradler, Yatziry Deakins K, MD     Interpretation: normal     ECG rate:  95   ECG rate assessment: normal     Rhythm: sinus rhythm     Ectopy: none     Conduction: normal    MEDICATIONS ORDERED IN ED: Medications  phenol (CHLORASEPTIC) mouth spray 2 spray (has no administration in time range)  amoxicillin-clavulanate (AUGMENTIN) 875-125 MG per tablet 1 tablet (1 tablet Oral Given 10/10/21 1727)  dexamethasone (DECADRON) injection 10 mg (10 mg Intravenous Given 10/10/21 1855)   IMPRESSION / MDM / ASSESSMENT AND PLAN / ED COURSE  I reviewed the triage vital signs and the nursing notes.                             The patient is on the cardiac monitor to evaluate  for evidence of arrhythmia and/or significant heart rate changes. Patient's presentation is most consistent with acute presentation with potential threat to life or bodily function. 65 year old male presents for sore throat No history of immunocompromise. Nontoxic appearance. Patient euvolemic with no trismus. No airway compromise. No change in voice, exudates, enlarged lymph nodes. Able to tolerate PO. Given History and Exam I have low suspicion for this presentation being caused by PTA, RPA, Ludwigs angina, Epiglottitis or Bacterial Tracheitis, EBV, acute HIV. Patient is positive for group A strep as well as has a CT soft tissue of the neck showing likely pharyngeal/tonsillitis Patient symptoms improved after Decadron and received a dose of of oral antibiotics prior to discharge Rx:  Decadron, Augmentin Patient given strict return precautions to the ER as well as ENT as needed Dispo: Discharge home   FINAL CLINICAL IMPRESSION(S) / ED DIAGNOSES   Final diagnoses:  Streptococcal tonsillopharyngitis   Rx / DC Orders   ED Discharge Orders          Ordered    amoxicillin-clavulanate (AUGMENTIN) 875-125 MG tablet  2 times daily        10/10/21 1847    methylPREDNISolone (MEDROL DOSEPAK) 4 MG TBPK tablet        10/10/21 1847           Note:  This document was prepared using Dragon voice recognition software and may include unintentional dictation errors.   Merwyn Katos, MD 10/10/21 2132

## 2021-10-10 NOTE — ED Triage Notes (Signed)
Pt via POV from home. Pt c/o hemoptysis for the past week. Denies any CP/SOB but states he feels like he has sores in his mouth. States the blood is dark. Denies any blood thinners. Pt is A&Ox4 and NAD  Pt had a kidney transplant in 2008.

## 2021-10-14 ENCOUNTER — Ambulatory Visit: Admit: 2021-10-14 | Discharge: 2021-10-15 | Payer: MEDICARE | Attending: Nephrology | Primary: Nephrology

## 2021-10-14 ENCOUNTER — Ambulatory Visit: Admit: 2021-10-14 | Discharge: 2021-10-15 | Payer: MEDICARE

## 2021-10-14 DIAGNOSIS — Z940 Kidney transplant status: Principal | ICD-10-CM

## 2021-10-14 DIAGNOSIS — Z79899 Other long term (current) drug therapy: Principal | ICD-10-CM

## 2021-10-22 DIAGNOSIS — J398 Other specified diseases of upper respiratory tract: Principal | ICD-10-CM

## 2021-10-22 DIAGNOSIS — Z940 Kidney transplant status: Principal | ICD-10-CM

## 2021-10-22 DIAGNOSIS — J029 Acute pharyngitis, unspecified: Principal | ICD-10-CM

## 2021-10-22 DIAGNOSIS — Z79899 Other long term (current) drug therapy: Principal | ICD-10-CM

## 2021-10-22 DIAGNOSIS — Z139 Encounter for screening, unspecified: Principal | ICD-10-CM

## 2021-10-23 DIAGNOSIS — I159 Secondary hypertension, unspecified: Principal | ICD-10-CM

## 2021-10-23 DIAGNOSIS — Z940 Kidney transplant status: Principal | ICD-10-CM

## 2021-10-23 MED ORDER — HYDRALAZINE 25 MG TABLET
ORAL_TABLET | Freq: Three times a day (TID) | ORAL | 3 refills | 90.00000 days | Status: CP
Start: 2021-10-23 — End: 2022-10-23

## 2021-10-27 ENCOUNTER — Ambulatory Visit: Admit: 2021-10-27 | Discharge: 2021-10-28 | Payer: MEDICARE

## 2021-10-27 DIAGNOSIS — E113593 Type 2 diabetes mellitus with proliferative diabetic retinopathy without macular edema, bilateral: Principal | ICD-10-CM

## 2021-11-02 DIAGNOSIS — D849 Immunodeficiency, unspecified: Principal | ICD-10-CM

## 2021-11-02 DIAGNOSIS — Z940 Kidney transplant status: Principal | ICD-10-CM

## 2021-11-05 DIAGNOSIS — I10 Essential (primary) hypertension: Principal | ICD-10-CM

## 2021-11-05 DIAGNOSIS — Z940 Kidney transplant status: Principal | ICD-10-CM

## 2021-11-05 MED ORDER — METOPROLOL TARTRATE 50 MG TABLET
ORAL_TABLET | 3 refills | 0 days | Status: CP
Start: 2021-11-05 — End: ?

## 2021-11-19 DIAGNOSIS — Z940 Kidney transplant status: Principal | ICD-10-CM

## 2021-11-24 DIAGNOSIS — I10 Essential (primary) hypertension: Principal | ICD-10-CM

## 2021-11-24 DIAGNOSIS — Z940 Kidney transplant status: Principal | ICD-10-CM

## 2021-11-24 MED ORDER — ENALAPRIL MALEATE 10 MG TABLET
ORAL_TABLET | 3 refills | 0 days
Start: 2021-11-24 — End: ?

## 2021-11-24 MED ORDER — TRAMADOL 50 MG TABLET
ORAL_TABLET | 0 refills | 0 days | Status: CP
Start: 2021-11-24 — End: ?

## 2021-11-25 DIAGNOSIS — I10 Essential (primary) hypertension: Principal | ICD-10-CM

## 2021-11-25 MED ORDER — ENALAPRIL MALEATE 10 MG TABLET
ORAL_TABLET | 3 refills | 0 days
Start: 2021-11-25 — End: ?

## 2021-11-25 MED ORDER — ENALAPRIL MALEATE 20 MG TABLET
ORAL_TABLET | Freq: Two times a day (BID) | ORAL | 3 refills | 90 days | Status: CP
Start: 2021-11-25 — End: 2022-11-25

## 2021-12-04 ENCOUNTER — Ambulatory Visit: Admit: 2021-12-04 | Discharge: 2021-12-05 | Payer: MEDICARE

## 2022-01-12 MED ORDER — FUROSEMIDE 20 MG TABLET
ORAL_TABLET | 3 refills | 0 days
Start: 2022-01-12 — End: ?

## 2022-01-17 DIAGNOSIS — Z940 Kidney transplant status: Principal | ICD-10-CM

## 2022-01-17 MED ORDER — FUROSEMIDE 20 MG TABLET
ORAL_TABLET | Freq: Every day | ORAL | 3 refills | 0 days
Start: 2022-01-17 — End: ?

## 2022-01-17 MED ORDER — OMEPRAZOLE 40 MG CAPSULE,DELAYED RELEASE
ORAL_CAPSULE | Freq: Every day | ORAL | 4 refills | 0 days
Start: 2022-01-17 — End: ?

## 2022-01-18 MED ORDER — FUROSEMIDE 20 MG TABLET
ORAL_TABLET | Freq: Every day | ORAL | 3 refills | 90 days | Status: CP
Start: 2022-01-18 — End: ?

## 2022-01-18 MED ORDER — OMEPRAZOLE 40 MG CAPSULE,DELAYED RELEASE
ORAL_CAPSULE | Freq: Every day | ORAL | 4 refills | 90 days | Status: CP
Start: 2022-01-18 — End: ?

## 2022-02-08 DIAGNOSIS — E118 Type 2 diabetes mellitus with unspecified complications: Principal | ICD-10-CM

## 2022-02-08 DIAGNOSIS — I10 Essential (primary) hypertension: Principal | ICD-10-CM

## 2022-02-08 MED ORDER — ENALAPRIL MALEATE 20 MG TABLET
ORAL_TABLET | ORAL | 10 refills | 0 days | Status: CP
Start: 2022-02-08 — End: ?

## 2022-02-08 MED ORDER — TRUE METRIX GLUCOSE TEST STRIP
ORAL_STRIP | 10 refills | 0 days | Status: CP
Start: 2022-02-08 — End: ?

## 2022-02-20 DIAGNOSIS — Z940 Kidney transplant status: Principal | ICD-10-CM

## 2022-02-20 MED ORDER — TRAMADOL 50 MG TABLET
ORAL_TABLET | 0 refills | 0 days
Start: 2022-02-20 — End: ?

## 2022-02-22 MED ORDER — TRAMADOL 50 MG TABLET
ORAL_TABLET | 0 refills | 0 days | Status: CP
Start: 2022-02-22 — End: ?

## 2022-03-08 DIAGNOSIS — I10 Essential (primary) hypertension: Principal | ICD-10-CM

## 2022-03-08 DIAGNOSIS — Z940 Kidney transplant status: Principal | ICD-10-CM

## 2022-03-08 MED ORDER — HYDROCHLOROTHIAZIDE 50 MG TABLET
ORAL_TABLET | Freq: Every day | ORAL | 3 refills | 90 days | Status: CP
Start: 2022-03-08 — End: ?

## 2022-04-19 DIAGNOSIS — Z940 Kidney transplant status: Principal | ICD-10-CM

## 2022-04-19 DIAGNOSIS — Z79899 Other long term (current) drug therapy: Principal | ICD-10-CM

## 2022-04-19 DIAGNOSIS — E559 Vitamin D deficiency, unspecified: Principal | ICD-10-CM

## 2022-04-22 ENCOUNTER — Encounter: Admit: 2022-04-22 | Discharge: 2022-04-23 | Payer: MEDICARE | Attending: Nephrology | Primary: Nephrology

## 2022-04-22 ENCOUNTER — Ambulatory Visit: Admit: 2022-04-22 | Discharge: 2022-04-23 | Payer: MEDICARE

## 2022-04-22 ENCOUNTER — Ambulatory Visit: Admit: 2022-04-22 | Discharge: 2022-04-23 | Payer: MEDICARE | Attending: Nephrology | Primary: Nephrology

## 2022-04-22 DIAGNOSIS — R197 Diarrhea, unspecified: Principal | ICD-10-CM

## 2022-04-22 DIAGNOSIS — Z79899 Other long term (current) drug therapy: Principal | ICD-10-CM

## 2022-04-22 DIAGNOSIS — Z940 Kidney transplant status: Principal | ICD-10-CM

## 2022-04-22 DIAGNOSIS — R059 Cough, unspecified type: Principal | ICD-10-CM

## 2022-04-22 DIAGNOSIS — E559 Vitamin D deficiency, unspecified: Principal | ICD-10-CM

## 2022-05-25 DIAGNOSIS — Z940 Kidney transplant status: Principal | ICD-10-CM

## 2022-05-25 MED ORDER — MYCOPHENOLATE MOFETIL 250 MG CAPSULE
ORAL_CAPSULE | 3 refills | 0 days | Status: CP
Start: 2022-05-25 — End: ?

## 2022-05-25 MED ORDER — CYCLOSPORINE MODIFIED 25 MG CAPSULE
ORAL_CAPSULE | 3 refills | 0 days | Status: CP
Start: 2022-05-25 — End: ?

## 2022-05-25 MED ORDER — CYCLOSPORINE MODIFIED 100 MG CAPSULE
ORAL_CAPSULE | 3 refills | 0 days | Status: CP
Start: 2022-05-25 — End: ?

## 2022-06-19 DIAGNOSIS — Z940 Kidney transplant status: Principal | ICD-10-CM

## 2022-06-19 MED ORDER — TRAMADOL 50 MG TABLET
ORAL_TABLET | 0 refills | 0 days
Start: 2022-06-19 — End: ?

## 2022-06-21 MED ORDER — TRAMADOL 50 MG TABLET
ORAL_TABLET | Freq: Four times a day (QID) | ORAL | 0 refills | 7 days | Status: CP | PRN
Start: 2022-06-21 — End: ?

## 2022-07-09 DIAGNOSIS — Z940 Kidney transplant status: Principal | ICD-10-CM

## 2022-07-09 MED ORDER — SODIUM BICARBONATE 650 MG TABLET
ORAL_TABLET | Freq: Three times a day (TID) | ORAL | 2 refills | 90 days | Status: CP
Start: 2022-07-09 — End: 2023-07-10

## 2022-07-17 ENCOUNTER — Ambulatory Visit
Admit: 2022-07-17 | Discharge: 2022-07-17 | Disposition: A | Discharge status: Home / Self Care | Payer: MEDICARE | Attending: Emergency Medicine

## 2022-07-17 ENCOUNTER — Emergency Department
Admit: 2022-07-17 | Discharge: 2022-07-17 | Disposition: A | Discharge status: Home / Self Care | Payer: MEDICARE | Attending: Emergency Medicine

## 2022-07-21 DIAGNOSIS — Z940 Kidney transplant status: Principal | ICD-10-CM

## 2022-07-21 DIAGNOSIS — N186 End stage renal disease: Principal | ICD-10-CM

## 2022-07-21 DIAGNOSIS — D849 Immunodeficiency, unspecified: Principal | ICD-10-CM

## 2022-07-28 ENCOUNTER — Ambulatory Visit: Admit: 2022-07-28 | Discharge: 2022-07-28 | Payer: MEDICARE | Attending: Nephrology | Primary: Nephrology

## 2022-07-28 ENCOUNTER — Ambulatory Visit: Admit: 2022-07-28 | Discharge: 2022-07-28 | Payer: MEDICARE

## 2022-07-28 ENCOUNTER — Encounter: Admit: 2022-07-28 | Discharge: 2022-07-28 | Payer: MEDICARE | Attending: Nephrology | Primary: Nephrology

## 2022-07-28 DIAGNOSIS — N401 Enlarged prostate with lower urinary tract symptoms: Principal | ICD-10-CM

## 2022-07-28 DIAGNOSIS — Z940 Kidney transplant status: Principal | ICD-10-CM

## 2022-07-28 DIAGNOSIS — D849 Immunodeficiency, unspecified: Principal | ICD-10-CM

## 2022-07-28 DIAGNOSIS — R809 Proteinuria, unspecified: Principal | ICD-10-CM

## 2022-07-28 DIAGNOSIS — N186 End stage renal disease: Principal | ICD-10-CM

## 2022-07-28 DIAGNOSIS — N3941 Urge incontinence: Principal | ICD-10-CM

## 2022-07-28 MED ORDER — TAMSULOSIN 0.4 MG CAPSULE
ORAL_CAPSULE | Freq: Every day | ORAL | 3 refills | 90 days | Status: CP
Start: 2022-07-28 — End: 2023-07-28

## 2022-07-28 MED ORDER — FUROSEMIDE 80 MG TABLET
ORAL_TABLET | Freq: Two times a day (BID) | ORAL | 11 refills | 30 days | Status: CP
Start: 2022-07-28 — End: 2023-07-28

## 2022-08-05 ENCOUNTER — Ambulatory Visit: Admit: 2022-08-05 | Discharge: 2022-08-06 | Payer: MEDICARE

## 2022-08-05 DIAGNOSIS — R809 Proteinuria, unspecified: Principal | ICD-10-CM

## 2022-08-05 DIAGNOSIS — Z48298 Encounter for aftercare following other organ transplant: Principal | ICD-10-CM

## 2022-08-12 DIAGNOSIS — Z940 Kidney transplant status: Principal | ICD-10-CM

## 2022-08-12 MED ORDER — TRAMADOL 50 MG TABLET
ORAL_TABLET | Freq: Four times a day (QID) | ORAL | 0 refills | 7 days | Status: CP | PRN
Start: 2022-08-12 — End: ?

## 2022-08-14 DIAGNOSIS — I159 Secondary hypertension, unspecified: Principal | ICD-10-CM

## 2022-08-14 DIAGNOSIS — Z940 Kidney transplant status: Principal | ICD-10-CM

## 2022-08-14 MED ORDER — HYDRALAZINE 25 MG TABLET
ORAL_TABLET | 2 refills | 0 days
Start: 2022-08-14 — End: ?

## 2022-08-15 MED ORDER — HYDRALAZINE 25 MG TABLET
ORAL_TABLET | 2 refills | 0 days | Status: CP
Start: 2022-08-15 — End: ?

## 2022-08-19 ENCOUNTER — Ambulatory Visit: Admit: 2022-08-19 | Discharge: 2022-08-19 | Payer: MEDICARE | Attending: Nephrology | Primary: Nephrology

## 2022-08-19 ENCOUNTER — Ambulatory Visit: Admit: 2022-08-19 | Discharge: 2022-08-19 | Payer: MEDICARE

## 2022-08-19 DIAGNOSIS — E118 Type 2 diabetes mellitus with unspecified complications: Principal | ICD-10-CM

## 2022-08-19 DIAGNOSIS — Z862 Personal history of diseases of the blood and blood-forming organs and certain disorders involving the immune mechanism: Principal | ICD-10-CM

## 2022-08-19 DIAGNOSIS — N189 Chronic kidney disease, unspecified: Principal | ICD-10-CM

## 2022-08-19 DIAGNOSIS — Z940 Kidney transplant status: Principal | ICD-10-CM

## 2022-08-19 DIAGNOSIS — I10 Essential (primary) hypertension: Principal | ICD-10-CM

## 2022-08-19 DIAGNOSIS — N184 Chronic kidney disease, stage 4 (severe): Principal | ICD-10-CM

## 2022-08-19 DIAGNOSIS — D849 Immunodeficiency, unspecified: Principal | ICD-10-CM

## 2022-08-19 DIAGNOSIS — Z48298 Encounter for aftercare following other organ transplant: Principal | ICD-10-CM

## 2022-08-23 DIAGNOSIS — N189 Chronic kidney disease, unspecified: Principal | ICD-10-CM

## 2022-08-23 DIAGNOSIS — Z862 Personal history of diseases of the blood and blood-forming organs and certain disorders involving the immune mechanism: Principal | ICD-10-CM

## 2022-08-23 DIAGNOSIS — Z940 Kidney transplant status: Principal | ICD-10-CM

## 2022-08-23 MED ORDER — FUROSEMIDE 80 MG TABLET
ORAL_TABLET | Freq: Every day | ORAL | 11 refills | 30 days | Status: CP
Start: 2022-08-23 — End: 2023-08-23

## 2022-08-27 ENCOUNTER — Ambulatory Visit: Admit: 2022-08-27 | Discharge: 2022-08-27 | Payer: MEDICARE

## 2022-08-27 DIAGNOSIS — Z48298 Encounter for aftercare following other organ transplant: Principal | ICD-10-CM

## 2022-08-27 DIAGNOSIS — R809 Proteinuria, unspecified: Principal | ICD-10-CM

## 2022-09-13 DIAGNOSIS — Z940 Kidney transplant status: Principal | ICD-10-CM

## 2022-09-13 MED ORDER — TRAMADOL 50 MG TABLET
ORAL_TABLET | Freq: Four times a day (QID) | ORAL | 0 refills | 0 days | PRN
Start: 2022-09-13 — End: ?

## 2022-09-15 MED ORDER — TRAMADOL 50 MG TABLET
ORAL_TABLET | Freq: Four times a day (QID) | ORAL | 0 refills | 7 days | Status: CP | PRN
Start: 2022-09-15 — End: ?

## 2022-09-28 ENCOUNTER — Ambulatory Visit: Admit: 2022-09-28 | Discharge: 2022-09-28 | Payer: MEDICARE

## 2022-09-30 DIAGNOSIS — D849 Immunodeficiency, unspecified: Principal | ICD-10-CM

## 2022-09-30 DIAGNOSIS — N186 End stage renal disease: Principal | ICD-10-CM

## 2022-09-30 DIAGNOSIS — Z940 Kidney transplant status: Principal | ICD-10-CM

## 2022-10-03 DIAGNOSIS — Z940 Kidney transplant status: Principal | ICD-10-CM

## 2022-10-03 MED ORDER — OMEPRAZOLE 40 MG CAPSULE,DELAYED RELEASE
ORAL_CAPSULE | Freq: Every day | ORAL | 3 refills | 90 days | Status: CP
Start: 2022-10-03 — End: ?

## 2022-10-05 DIAGNOSIS — Z48298 Encounter for aftercare following other organ transplant: Principal | ICD-10-CM

## 2022-10-05 DIAGNOSIS — T8611 Kidney transplant rejection: Principal | ICD-10-CM

## 2022-10-11 ENCOUNTER — Encounter
Admit: 2022-10-11 | Discharge: 2022-10-14 | Disposition: A | Discharge status: Home / Self Care | Payer: MEDICARE | Attending: Emergency Medicine | Admitting: Adult Health

## 2022-10-11 ENCOUNTER — Ambulatory Visit
Admit: 2022-10-11 | Discharge: 2022-10-14 | Disposition: A | Discharge status: Home / Self Care | Payer: MEDICARE | Admitting: Adult Health

## 2022-10-14 MED ORDER — BLOOD-GLUCOSE METER KIT WRAPPER
0 refills | 0 days | Status: CP
Start: 2022-10-14 — End: 2023-10-14

## 2022-10-14 MED ORDER — LANCETS
0 refills | 0 days | Status: CP
Start: 2022-10-14 — End: ?

## 2022-10-14 MED ORDER — BLOOD GLUCOSE TEST STRIPS
ORAL_STRIP | 0 refills | 0 days | Status: CP
Start: 2022-10-14 — End: ?

## 2022-10-15 ENCOUNTER — Institutional Professional Consult (permissible substitution): Admit: 2022-10-15 | Discharge: 2022-10-16 | Payer: MEDICARE | Attending: Nephrology | Primary: Nephrology

## 2022-10-15 MED ORDER — PREDNISONE 5 MG TABLET
ORAL_TABLET | Freq: Every day | ORAL | 0 refills | 30 days | Status: CP
Start: 2022-10-15 — End: ?

## 2022-10-15 MED ORDER — PEG 3350-ELECTROLYTES 236 GRAM-22.74 GRAM-6.74 GRAM-5.86 GRAM SOLUTION
0 refills | 0 days | Status: CP
Start: 2022-10-15 — End: ?

## 2022-10-15 MED ORDER — SULFAMETHOXAZOLE 800 MG-TRIMETHOPRIM 160 MG TABLET
ORAL_TABLET | ORAL | 0 refills | 35 days | Status: CP
Start: 2022-10-15 — End: ?

## 2022-10-20 DIAGNOSIS — Z299 Encounter for prophylactic measures, unspecified: Principal | ICD-10-CM

## 2022-10-20 DIAGNOSIS — T8611 Kidney transplant rejection: Principal | ICD-10-CM

## 2022-10-20 MED ORDER — SULFAMETHOXAZOLE 800 MG-TRIMETHOPRIM 160 MG TABLET
ORAL_TABLET | ORAL | 0 refills | 35 days
Start: 2022-10-20 — End: ?

## 2022-10-20 MED ORDER — PREDNISONE 5 MG TABLET
ORAL_TABLET | Freq: Every day | ORAL | 0 refills | 30 days
Start: 2022-10-20 — End: ?

## 2022-10-21 DIAGNOSIS — Z940 Kidney transplant status: Principal | ICD-10-CM

## 2022-10-21 MED ORDER — TRAMADOL 50 MG TABLET
ORAL_TABLET | Freq: Four times a day (QID) | ORAL | 0 refills | 0 days | PRN
Start: 2022-10-21 — End: ?

## 2022-10-25 DIAGNOSIS — Z48298 Encounter for aftercare following other organ transplant: Principal | ICD-10-CM

## 2022-10-25 DIAGNOSIS — N189 Chronic kidney disease, unspecified: Principal | ICD-10-CM

## 2022-10-25 MED ORDER — TRAMADOL 50 MG TABLET
ORAL_TABLET | Freq: Four times a day (QID) | ORAL | 0 refills | 7 days | Status: CP | PRN
Start: 2022-10-25 — End: ?

## 2022-10-29 DIAGNOSIS — Z48298 Encounter for aftercare following other organ transplant: Principal | ICD-10-CM

## 2022-10-29 DIAGNOSIS — T8611 Kidney transplant rejection: Principal | ICD-10-CM

## 2022-11-07 DIAGNOSIS — T8611 Kidney transplant rejection: Principal | ICD-10-CM

## 2022-11-07 DIAGNOSIS — Z48298 Encounter for aftercare following other organ transplant: Principal | ICD-10-CM

## 2022-11-12 DIAGNOSIS — Z940 Kidney transplant status: Principal | ICD-10-CM

## 2022-11-12 DIAGNOSIS — I10 Essential (primary) hypertension: Principal | ICD-10-CM

## 2022-11-12 MED ORDER — METOPROLOL TARTRATE 50 MG TABLET
ORAL_TABLET | 3 refills | 0 days | Status: CP
Start: 2022-11-12 — End: ?

## 2022-11-19 DIAGNOSIS — Z299 Encounter for prophylactic measures, unspecified: Principal | ICD-10-CM

## 2022-11-19 DIAGNOSIS — T8611 Kidney transplant rejection: Principal | ICD-10-CM

## 2022-11-19 MED ORDER — SULFAMETHOXAZOLE 800 MG-TRIMETHOPRIM 160 MG TABLET
ORAL_TABLET | ORAL | 0 refills | 35 days
Start: 2022-11-19 — End: ?

## 2022-11-23 DIAGNOSIS — Z940 Kidney transplant status: Principal | ICD-10-CM

## 2022-11-23 DIAGNOSIS — T8611 Kidney transplant rejection: Principal | ICD-10-CM

## 2022-11-25 DIAGNOSIS — Z940 Kidney transplant status: Principal | ICD-10-CM

## 2022-11-25 MED ORDER — TRAMADOL 50 MG TABLET
ORAL_TABLET | Freq: Four times a day (QID) | ORAL | 0 refills | 7 days | Status: CP | PRN
Start: 2022-11-25 — End: ?

## 2022-11-25 MED ORDER — PREDNISONE 5 MG TABLET
ORAL_TABLET | Freq: Every day | ORAL | 0 refills | 0 days
Start: 2022-11-25 — End: ?

## 2022-11-26 MED ORDER — PREDNISONE 5 MG TABLET
ORAL_TABLET | Freq: Every day | ORAL | 0 refills | 30 days
Start: 2022-11-26 — End: ?

## 2022-12-16 DIAGNOSIS — R799 Abnormal finding of blood chemistry, unspecified: Principal | ICD-10-CM

## 2022-12-16 DIAGNOSIS — T8611 Kidney transplant rejection: Principal | ICD-10-CM

## 2022-12-16 DIAGNOSIS — N186 End stage renal disease: Principal | ICD-10-CM

## 2022-12-16 DIAGNOSIS — D849 Immunodeficiency, unspecified: Principal | ICD-10-CM

## 2022-12-20 ENCOUNTER — Encounter: Admit: 2022-12-20 | Discharge: 2022-12-20 | Payer: MEDICARE

## 2022-12-20 ENCOUNTER — Ambulatory Visit: Admit: 2022-12-20 | Discharge: 2022-12-20 | Payer: MEDICARE

## 2022-12-20 DIAGNOSIS — K2970 Gastritis, unspecified, without bleeding: Secondary | ICD-10-CM | POA: Diagnosis not present

## 2022-12-20 DIAGNOSIS — K5730 Diverticulosis of large intestine without perforation or abscess without bleeding: Secondary | ICD-10-CM | POA: Diagnosis not present

## 2022-12-20 DIAGNOSIS — K648 Other hemorrhoids: Secondary | ICD-10-CM | POA: Diagnosis not present

## 2022-12-20 DIAGNOSIS — K2950 Unspecified chronic gastritis without bleeding: Secondary | ICD-10-CM | POA: Diagnosis not present

## 2022-12-20 DIAGNOSIS — D509 Iron deficiency anemia, unspecified: Secondary | ICD-10-CM | POA: Diagnosis not present

## 2022-12-22 ENCOUNTER — Encounter: Admit: 2022-12-22 | Attending: Anesthesiology

## 2022-12-22 ENCOUNTER — Ambulatory Visit: Admit: 2022-12-22 | Payer: MEDICARE

## 2022-12-22 ENCOUNTER — Encounter: Admit: 2022-12-22 | Payer: MEDICARE | Attending: Student in an Organized Health Care Education/Training Program

## 2022-12-22 ENCOUNTER — Ambulatory Visit
Admit: 2022-12-22 | Discharge: 2023-02-12 | Disposition: A | Discharge status: Rehabilitation Facility | Payer: MEDICARE | Admitting: Infectious Disease

## 2022-12-22 ENCOUNTER — Encounter: Admit: 2022-12-22 | Discharge: 2023-02-12

## 2022-12-22 ENCOUNTER — Encounter
Admit: 2022-12-22 | Discharge: 2023-02-12 | Disposition: A | Discharge status: Rehabilitation Facility | Payer: MEDICARE | Admitting: Infectious Disease

## 2022-12-22 ENCOUNTER — Encounter: Admit: 2022-12-22 | Payer: MEDICARE | Attending: Infectious Disease

## 2022-12-22 ENCOUNTER — Ambulatory Visit: Admit: 2022-12-22

## 2022-12-22 ENCOUNTER — Encounter: Admit: 2022-12-22 | Payer: MEDICARE

## 2022-12-22 ENCOUNTER — Encounter: Admit: 2022-12-22 | Attending: Infectious Disease

## 2022-12-22 ENCOUNTER — Ambulatory Visit: Admit: 2022-12-22 | Discharge: 2023-02-12

## 2022-12-22 DIAGNOSIS — T8611 Kidney transplant rejection: Secondary | ICD-10-CM | POA: Diagnosis not present

## 2022-12-22 DIAGNOSIS — L97413 Non-pressure chronic ulcer of right heel and midfoot with necrosis of muscle: Secondary | ICD-10-CM | POA: Diagnosis not present

## 2022-12-22 DIAGNOSIS — E1140 Type 2 diabetes mellitus with diabetic neuropathy, unspecified: Secondary | ICD-10-CM | POA: Diagnosis not present

## 2022-12-22 DIAGNOSIS — M85871 Other specified disorders of bone density and structure, right ankle and foot: Secondary | ICD-10-CM | POA: Diagnosis not present

## 2022-12-22 DIAGNOSIS — M19071 Primary osteoarthritis, right ankle and foot: Secondary | ICD-10-CM | POA: Diagnosis not present

## 2022-12-22 DIAGNOSIS — B351 Tinea unguium: Secondary | ICD-10-CM | POA: Diagnosis not present

## 2022-12-22 DIAGNOSIS — L97519 Non-pressure chronic ulcer of other part of right foot with unspecified severity: Secondary | ICD-10-CM | POA: Diagnosis not present

## 2022-12-22 DIAGNOSIS — L97415 Non-pressure chronic ulcer of right heel and midfoot with muscle involvement without evidence of necrosis: Secondary | ICD-10-CM | POA: Diagnosis not present

## 2022-12-22 DIAGNOSIS — E11621 Type 2 diabetes mellitus with foot ulcer: Secondary | ICD-10-CM | POA: Diagnosis not present

## 2022-12-22 DIAGNOSIS — L97419 Non-pressure chronic ulcer of right heel and midfoot with unspecified severity: Secondary | ICD-10-CM | POA: Diagnosis not present

## 2022-12-22 DIAGNOSIS — M8660 Other chronic osteomyelitis, unspecified site: Secondary | ICD-10-CM | POA: Diagnosis not present

## 2022-12-22 DIAGNOSIS — R936 Abnormal findings on diagnostic imaging of limbs: Secondary | ICD-10-CM | POA: Diagnosis not present

## 2022-12-22 DIAGNOSIS — L600 Ingrowing nail: Secondary | ICD-10-CM | POA: Diagnosis not present

## 2022-12-23 DIAGNOSIS — E11621 Type 2 diabetes mellitus with foot ulcer: Secondary | ICD-10-CM | POA: Diagnosis not present

## 2022-12-23 DIAGNOSIS — M86171 Other acute osteomyelitis, right ankle and foot: Secondary | ICD-10-CM | POA: Diagnosis not present

## 2022-12-23 DIAGNOSIS — D849 Immunodeficiency, unspecified: Secondary | ICD-10-CM | POA: Diagnosis not present

## 2022-12-23 DIAGNOSIS — M7989 Other specified soft tissue disorders: Secondary | ICD-10-CM | POA: Diagnosis not present

## 2022-12-23 DIAGNOSIS — L089 Local infection of the skin and subcutaneous tissue, unspecified: Secondary | ICD-10-CM | POA: Diagnosis not present

## 2022-12-23 DIAGNOSIS — T8611 Kidney transplant rejection: Secondary | ICD-10-CM | POA: Diagnosis not present

## 2022-12-23 DIAGNOSIS — L97419 Non-pressure chronic ulcer of right heel and midfoot with unspecified severity: Secondary | ICD-10-CM | POA: Diagnosis not present

## 2022-12-23 DIAGNOSIS — L97412 Non-pressure chronic ulcer of right heel and midfoot with fat layer exposed: Secondary | ICD-10-CM | POA: Diagnosis not present

## 2022-12-24 DIAGNOSIS — D84821 Immunodeficiency due to drugs: Secondary | ICD-10-CM | POA: Diagnosis not present

## 2022-12-24 DIAGNOSIS — D849 Immunodeficiency, unspecified: Secondary | ICD-10-CM | POA: Diagnosis not present

## 2022-12-24 DIAGNOSIS — E1151 Type 2 diabetes mellitus with diabetic peripheral angiopathy without gangrene: Secondary | ICD-10-CM | POA: Diagnosis not present

## 2022-12-24 DIAGNOSIS — E11621 Type 2 diabetes mellitus with foot ulcer: Secondary | ICD-10-CM | POA: Diagnosis not present

## 2022-12-24 DIAGNOSIS — M86171 Other acute osteomyelitis, right ankle and foot: Secondary | ICD-10-CM | POA: Diagnosis not present

## 2022-12-24 DIAGNOSIS — Z79621 Long term (current) use of calcineurin inhibitor: Secondary | ICD-10-CM | POA: Diagnosis not present

## 2022-12-24 DIAGNOSIS — L97419 Non-pressure chronic ulcer of right heel and midfoot with unspecified severity: Secondary | ICD-10-CM | POA: Diagnosis not present

## 2022-12-24 DIAGNOSIS — Z940 Kidney transplant status: Secondary | ICD-10-CM | POA: Diagnosis not present

## 2022-12-24 DIAGNOSIS — T8611 Kidney transplant rejection: Secondary | ICD-10-CM | POA: Diagnosis not present

## 2022-12-25 DIAGNOSIS — L97419 Non-pressure chronic ulcer of right heel and midfoot with unspecified severity: Secondary | ICD-10-CM | POA: Diagnosis not present

## 2022-12-25 DIAGNOSIS — E11621 Type 2 diabetes mellitus with foot ulcer: Secondary | ICD-10-CM | POA: Diagnosis not present

## 2022-12-25 DIAGNOSIS — M8660 Other chronic osteomyelitis, unspecified site: Secondary | ICD-10-CM | POA: Diagnosis not present

## 2022-12-25 DIAGNOSIS — T8611 Kidney transplant rejection: Secondary | ICD-10-CM | POA: Diagnosis not present

## 2022-12-26 DIAGNOSIS — L97419 Non-pressure chronic ulcer of right heel and midfoot with unspecified severity: Secondary | ICD-10-CM | POA: Diagnosis not present

## 2022-12-26 DIAGNOSIS — M8660 Other chronic osteomyelitis, unspecified site: Secondary | ICD-10-CM | POA: Diagnosis not present

## 2022-12-26 DIAGNOSIS — T8611 Kidney transplant rejection: Secondary | ICD-10-CM | POA: Diagnosis not present

## 2022-12-26 DIAGNOSIS — E1151 Type 2 diabetes mellitus with diabetic peripheral angiopathy without gangrene: Secondary | ICD-10-CM | POA: Diagnosis not present

## 2022-12-26 DIAGNOSIS — E11621 Type 2 diabetes mellitus with foot ulcer: Secondary | ICD-10-CM | POA: Diagnosis not present

## 2022-12-26 DIAGNOSIS — D849 Immunodeficiency, unspecified: Secondary | ICD-10-CM | POA: Diagnosis not present

## 2022-12-27 DIAGNOSIS — I771 Stricture of artery: Secondary | ICD-10-CM | POA: Diagnosis not present

## 2022-12-27 DIAGNOSIS — B964 Proteus (mirabilis) (morganii) as the cause of diseases classified elsewhere: Secondary | ICD-10-CM | POA: Diagnosis not present

## 2022-12-27 DIAGNOSIS — M8610 Other acute osteomyelitis, unspecified site: Secondary | ICD-10-CM | POA: Diagnosis not present

## 2022-12-27 DIAGNOSIS — N179 Acute kidney failure, unspecified: Secondary | ICD-10-CM | POA: Diagnosis not present

## 2022-12-27 DIAGNOSIS — I70201 Unspecified atherosclerosis of native arteries of extremities, right leg: Secondary | ICD-10-CM | POA: Diagnosis not present

## 2022-12-27 DIAGNOSIS — D84821 Immunodeficiency due to drugs: Secondary | ICD-10-CM | POA: Diagnosis not present

## 2022-12-27 DIAGNOSIS — Z79621 Long term (current) use of calcineurin inhibitor: Secondary | ICD-10-CM | POA: Diagnosis not present

## 2022-12-27 DIAGNOSIS — B965 Pseudomonas (aeruginosa) (mallei) (pseudomallei) as the cause of diseases classified elsewhere: Secondary | ICD-10-CM | POA: Diagnosis not present

## 2022-12-27 DIAGNOSIS — L97411 Non-pressure chronic ulcer of right heel and midfoot limited to breakdown of skin: Secondary | ICD-10-CM | POA: Diagnosis not present

## 2022-12-27 DIAGNOSIS — Z940 Kidney transplant status: Secondary | ICD-10-CM | POA: Diagnosis not present

## 2022-12-27 DIAGNOSIS — Z949 Transplanted organ and tissue status, unspecified: Secondary | ICD-10-CM | POA: Diagnosis not present

## 2022-12-28 DIAGNOSIS — L97419 Non-pressure chronic ulcer of right heel and midfoot with unspecified severity: Secondary | ICD-10-CM | POA: Diagnosis not present

## 2022-12-28 DIAGNOSIS — B964 Proteus (mirabilis) (morganii) as the cause of diseases classified elsewhere: Secondary | ICD-10-CM | POA: Diagnosis not present

## 2022-12-28 DIAGNOSIS — D84821 Immunodeficiency due to drugs: Secondary | ICD-10-CM | POA: Diagnosis not present

## 2022-12-28 DIAGNOSIS — E1151 Type 2 diabetes mellitus with diabetic peripheral angiopathy without gangrene: Secondary | ICD-10-CM | POA: Diagnosis not present

## 2022-12-28 DIAGNOSIS — Z940 Kidney transplant status: Secondary | ICD-10-CM | POA: Diagnosis not present

## 2022-12-28 DIAGNOSIS — L97411 Non-pressure chronic ulcer of right heel and midfoot limited to breakdown of skin: Secondary | ICD-10-CM | POA: Diagnosis not present

## 2022-12-28 DIAGNOSIS — Z79621 Long term (current) use of calcineurin inhibitor: Secondary | ICD-10-CM | POA: Diagnosis not present

## 2022-12-28 DIAGNOSIS — N179 Acute kidney failure, unspecified: Secondary | ICD-10-CM | POA: Diagnosis not present

## 2022-12-28 DIAGNOSIS — Z9862 Peripheral vascular angioplasty status: Secondary | ICD-10-CM | POA: Diagnosis not present

## 2022-12-28 DIAGNOSIS — M8610 Other acute osteomyelitis, unspecified site: Secondary | ICD-10-CM | POA: Diagnosis not present

## 2022-12-28 DIAGNOSIS — T8691 Unspecified transplanted organ and tissue rejection: Secondary | ICD-10-CM | POA: Diagnosis not present

## 2022-12-28 DIAGNOSIS — E11621 Type 2 diabetes mellitus with foot ulcer: Secondary | ICD-10-CM | POA: Diagnosis not present

## 2022-12-28 DIAGNOSIS — M869 Osteomyelitis, unspecified: Secondary | ICD-10-CM | POA: Diagnosis not present

## 2022-12-28 DIAGNOSIS — B965 Pseudomonas (aeruginosa) (mallei) (pseudomallei) as the cause of diseases classified elsewhere: Secondary | ICD-10-CM | POA: Diagnosis not present

## 2022-12-28 DIAGNOSIS — I739 Peripheral vascular disease, unspecified: Principal | ICD-10-CM

## 2022-12-29 DIAGNOSIS — I10 Essential (primary) hypertension: Secondary | ICD-10-CM | POA: Diagnosis not present

## 2022-12-29 DIAGNOSIS — D84821 Immunodeficiency due to drugs: Secondary | ICD-10-CM | POA: Diagnosis not present

## 2022-12-29 DIAGNOSIS — B965 Pseudomonas (aeruginosa) (mallei) (pseudomallei) as the cause of diseases classified elsewhere: Secondary | ICD-10-CM | POA: Diagnosis not present

## 2022-12-29 DIAGNOSIS — Z79621 Long term (current) use of calcineurin inhibitor: Secondary | ICD-10-CM | POA: Diagnosis not present

## 2022-12-29 DIAGNOSIS — N179 Acute kidney failure, unspecified: Secondary | ICD-10-CM | POA: Diagnosis not present

## 2022-12-29 DIAGNOSIS — Z940 Kidney transplant status: Secondary | ICD-10-CM | POA: Diagnosis not present

## 2022-12-29 DIAGNOSIS — M86171 Other acute osteomyelitis, right ankle and foot: Secondary | ICD-10-CM | POA: Diagnosis not present

## 2022-12-29 DIAGNOSIS — I739 Peripheral vascular disease, unspecified: Secondary | ICD-10-CM | POA: Diagnosis not present

## 2022-12-30 DIAGNOSIS — B964 Proteus (mirabilis) (morganii) as the cause of diseases classified elsewhere: Secondary | ICD-10-CM | POA: Diagnosis not present

## 2022-12-30 DIAGNOSIS — I739 Peripheral vascular disease, unspecified: Secondary | ICD-10-CM | POA: Diagnosis not present

## 2022-12-30 DIAGNOSIS — T8619 Other complication of kidney transplant: Secondary | ICD-10-CM | POA: Diagnosis not present

## 2022-12-30 DIAGNOSIS — N179 Acute kidney failure, unspecified: Secondary | ICD-10-CM | POA: Diagnosis not present

## 2022-12-30 DIAGNOSIS — M86171 Other acute osteomyelitis, right ankle and foot: Secondary | ICD-10-CM | POA: Diagnosis not present

## 2022-12-30 DIAGNOSIS — I10 Essential (primary) hypertension: Secondary | ICD-10-CM | POA: Diagnosis not present

## 2022-12-30 DIAGNOSIS — Z940 Kidney transplant status: Secondary | ICD-10-CM | POA: Diagnosis not present

## 2022-12-30 DIAGNOSIS — E1122 Type 2 diabetes mellitus with diabetic chronic kidney disease: Secondary | ICD-10-CM | POA: Diagnosis not present

## 2022-12-30 DIAGNOSIS — D84821 Immunodeficiency due to drugs: Secondary | ICD-10-CM | POA: Diagnosis not present

## 2022-12-30 DIAGNOSIS — T8611 Kidney transplant rejection: Secondary | ICD-10-CM | POA: Diagnosis not present

## 2022-12-30 DIAGNOSIS — Z79621 Long term (current) use of calcineurin inhibitor: Secondary | ICD-10-CM | POA: Diagnosis not present

## 2022-12-31 DIAGNOSIS — N184 Chronic kidney disease, stage 4 (severe): Secondary | ICD-10-CM | POA: Diagnosis not present

## 2022-12-31 DIAGNOSIS — L97411 Non-pressure chronic ulcer of right heel and midfoot limited to breakdown of skin: Secondary | ICD-10-CM | POA: Diagnosis not present

## 2022-12-31 DIAGNOSIS — D84821 Immunodeficiency due to drugs: Secondary | ICD-10-CM | POA: Diagnosis not present

## 2022-12-31 DIAGNOSIS — Z940 Kidney transplant status: Secondary | ICD-10-CM | POA: Diagnosis not present

## 2022-12-31 DIAGNOSIS — N179 Acute kidney failure, unspecified: Secondary | ICD-10-CM | POA: Diagnosis not present

## 2022-12-31 DIAGNOSIS — I10 Essential (primary) hypertension: Secondary | ICD-10-CM | POA: Diagnosis not present

## 2022-12-31 DIAGNOSIS — Z79621 Long term (current) use of calcineurin inhibitor: Secondary | ICD-10-CM | POA: Diagnosis not present

## 2022-12-31 DIAGNOSIS — D849 Immunodeficiency, unspecified: Secondary | ICD-10-CM | POA: Diagnosis not present

## 2022-12-31 DIAGNOSIS — T8611 Kidney transplant rejection: Secondary | ICD-10-CM | POA: Diagnosis not present

## 2022-12-31 DIAGNOSIS — M869 Osteomyelitis, unspecified: Secondary | ICD-10-CM | POA: Diagnosis not present

## 2023-01-01 DIAGNOSIS — L97415 Non-pressure chronic ulcer of right heel and midfoot with muscle involvement without evidence of necrosis: Secondary | ICD-10-CM | POA: Diagnosis not present

## 2023-01-01 DIAGNOSIS — D849 Immunodeficiency, unspecified: Secondary | ICD-10-CM | POA: Diagnosis not present

## 2023-01-01 DIAGNOSIS — E11621 Type 2 diabetes mellitus with foot ulcer: Secondary | ICD-10-CM | POA: Diagnosis not present

## 2023-01-01 DIAGNOSIS — T8611 Kidney transplant rejection: Secondary | ICD-10-CM | POA: Diagnosis not present

## 2023-01-01 DIAGNOSIS — L97411 Non-pressure chronic ulcer of right heel and midfoot limited to breakdown of skin: Secondary | ICD-10-CM | POA: Diagnosis not present

## 2023-01-01 DIAGNOSIS — M869 Osteomyelitis, unspecified: Secondary | ICD-10-CM | POA: Diagnosis not present

## 2023-01-01 DIAGNOSIS — N184 Chronic kidney disease, stage 4 (severe): Secondary | ICD-10-CM | POA: Diagnosis not present

## 2023-01-02 DIAGNOSIS — L97411 Non-pressure chronic ulcer of right heel and midfoot limited to breakdown of skin: Secondary | ICD-10-CM | POA: Diagnosis not present

## 2023-01-02 DIAGNOSIS — T8611 Kidney transplant rejection: Secondary | ICD-10-CM | POA: Diagnosis not present

## 2023-01-02 DIAGNOSIS — M869 Osteomyelitis, unspecified: Secondary | ICD-10-CM | POA: Diagnosis not present

## 2023-01-02 DIAGNOSIS — N184 Chronic kidney disease, stage 4 (severe): Secondary | ICD-10-CM | POA: Diagnosis not present

## 2023-01-02 DIAGNOSIS — D849 Immunodeficiency, unspecified: Secondary | ICD-10-CM | POA: Diagnosis not present

## 2023-01-02 DIAGNOSIS — Z48298 Encounter for aftercare following other organ transplant: Principal | ICD-10-CM

## 2023-01-03 DIAGNOSIS — N186 End stage renal disease: Secondary | ICD-10-CM | POA: Diagnosis not present

## 2023-01-03 DIAGNOSIS — R0902 Hypoxemia: Secondary | ICD-10-CM | POA: Diagnosis not present

## 2023-01-03 DIAGNOSIS — L97411 Non-pressure chronic ulcer of right heel and midfoot limited to breakdown of skin: Secondary | ICD-10-CM | POA: Diagnosis not present

## 2023-01-03 DIAGNOSIS — D84821 Immunodeficiency due to drugs: Secondary | ICD-10-CM | POA: Diagnosis not present

## 2023-01-03 DIAGNOSIS — T8611 Kidney transplant rejection: Secondary | ICD-10-CM | POA: Diagnosis not present

## 2023-01-03 DIAGNOSIS — N184 Chronic kidney disease, stage 4 (severe): Secondary | ICD-10-CM | POA: Diagnosis not present

## 2023-01-03 DIAGNOSIS — N179 Acute kidney failure, unspecified: Secondary | ICD-10-CM | POA: Diagnosis not present

## 2023-01-03 DIAGNOSIS — R778 Other specified abnormalities of plasma proteins: Secondary | ICD-10-CM | POA: Diagnosis not present

## 2023-01-03 DIAGNOSIS — I10 Essential (primary) hypertension: Secondary | ICD-10-CM | POA: Diagnosis not present

## 2023-01-03 DIAGNOSIS — Z79621 Long term (current) use of calcineurin inhibitor: Secondary | ICD-10-CM | POA: Diagnosis not present

## 2023-01-03 DIAGNOSIS — L89619 Pressure ulcer of right heel, unspecified stage: Secondary | ICD-10-CM | POA: Diagnosis not present

## 2023-01-03 DIAGNOSIS — J810 Acute pulmonary edema: Secondary | ICD-10-CM | POA: Diagnosis not present

## 2023-01-03 DIAGNOSIS — J9602 Acute respiratory failure with hypercapnia: Secondary | ICD-10-CM | POA: Diagnosis not present

## 2023-01-03 DIAGNOSIS — I739 Peripheral vascular disease, unspecified: Secondary | ICD-10-CM | POA: Diagnosis not present

## 2023-01-03 DIAGNOSIS — R0603 Acute respiratory distress: Secondary | ICD-10-CM | POA: Diagnosis not present

## 2023-01-03 DIAGNOSIS — R140 Abdominal distension (gaseous): Secondary | ICD-10-CM | POA: Diagnosis not present

## 2023-01-03 DIAGNOSIS — J90 Pleural effusion, not elsewhere classified: Secondary | ICD-10-CM | POA: Diagnosis not present

## 2023-01-03 DIAGNOSIS — Z940 Kidney transplant status: Secondary | ICD-10-CM | POA: Diagnosis not present

## 2023-01-03 DIAGNOSIS — Z4682 Encounter for fitting and adjustment of non-vascular catheter: Secondary | ICD-10-CM | POA: Diagnosis not present

## 2023-01-03 DIAGNOSIS — M869 Osteomyelitis, unspecified: Secondary | ICD-10-CM | POA: Diagnosis not present

## 2023-01-03 DIAGNOSIS — R0989 Other specified symptoms and signs involving the circulatory and respiratory systems: Secondary | ICD-10-CM | POA: Diagnosis not present

## 2023-01-03 DIAGNOSIS — J9601 Acute respiratory failure with hypoxia: Secondary | ICD-10-CM | POA: Diagnosis not present

## 2023-01-03 DIAGNOSIS — I120 Hypertensive chronic kidney disease with stage 5 chronic kidney disease or end stage renal disease: Secondary | ICD-10-CM | POA: Diagnosis not present

## 2023-01-03 DIAGNOSIS — Z452 Encounter for adjustment and management of vascular access device: Secondary | ICD-10-CM | POA: Diagnosis not present

## 2023-01-03 DIAGNOSIS — R918 Other nonspecific abnormal finding of lung field: Secondary | ICD-10-CM | POA: Diagnosis not present

## 2023-01-03 DIAGNOSIS — J811 Chronic pulmonary edema: Secondary | ICD-10-CM | POA: Diagnosis not present

## 2023-01-03 DIAGNOSIS — R079 Chest pain, unspecified: Secondary | ICD-10-CM | POA: Diagnosis not present

## 2023-01-03 DIAGNOSIS — N400 Enlarged prostate without lower urinary tract symptoms: Secondary | ICD-10-CM | POA: Diagnosis not present

## 2023-01-04 DIAGNOSIS — N186 End stage renal disease: Secondary | ICD-10-CM | POA: Diagnosis not present

## 2023-01-04 DIAGNOSIS — Z79621 Long term (current) use of calcineurin inhibitor: Secondary | ICD-10-CM | POA: Diagnosis not present

## 2023-01-04 DIAGNOSIS — Z940 Kidney transplant status: Secondary | ICD-10-CM | POA: Diagnosis not present

## 2023-01-04 DIAGNOSIS — M869 Osteomyelitis, unspecified: Secondary | ICD-10-CM | POA: Diagnosis not present

## 2023-01-04 DIAGNOSIS — T8611 Kidney transplant rejection: Secondary | ICD-10-CM | POA: Diagnosis not present

## 2023-01-04 DIAGNOSIS — N179 Acute kidney failure, unspecified: Secondary | ICD-10-CM | POA: Diagnosis not present

## 2023-01-04 DIAGNOSIS — I10 Essential (primary) hypertension: Secondary | ICD-10-CM | POA: Diagnosis not present

## 2023-01-04 DIAGNOSIS — J9601 Acute respiratory failure with hypoxia: Secondary | ICD-10-CM | POA: Diagnosis not present

## 2023-01-04 DIAGNOSIS — R29810 Facial weakness: Secondary | ICD-10-CM | POA: Diagnosis not present

## 2023-01-04 DIAGNOSIS — J810 Acute pulmonary edema: Secondary | ICD-10-CM | POA: Diagnosis not present

## 2023-01-04 DIAGNOSIS — I214 Non-ST elevation (NSTEMI) myocardial infarction: Secondary | ICD-10-CM | POA: Diagnosis not present

## 2023-01-04 DIAGNOSIS — L89610 Pressure ulcer of right heel, unstageable: Secondary | ICD-10-CM | POA: Diagnosis not present

## 2023-01-04 DIAGNOSIS — R778 Other specified abnormalities of plasma proteins: Secondary | ICD-10-CM | POA: Diagnosis not present

## 2023-01-04 DIAGNOSIS — D84821 Immunodeficiency due to drugs: Secondary | ICD-10-CM | POA: Diagnosis not present

## 2023-01-04 DIAGNOSIS — N400 Enlarged prostate without lower urinary tract symptoms: Secondary | ICD-10-CM | POA: Diagnosis not present

## 2023-01-04 DIAGNOSIS — J9602 Acute respiratory failure with hypercapnia: Secondary | ICD-10-CM | POA: Diagnosis not present

## 2023-01-04 DIAGNOSIS — I120 Hypertensive chronic kidney disease with stage 5 chronic kidney disease or end stage renal disease: Secondary | ICD-10-CM | POA: Diagnosis not present

## 2023-01-05 DIAGNOSIS — N186 End stage renal disease: Secondary | ICD-10-CM | POA: Diagnosis not present

## 2023-01-05 DIAGNOSIS — Z940 Kidney transplant status: Secondary | ICD-10-CM | POA: Diagnosis not present

## 2023-01-05 DIAGNOSIS — Z95820 Peripheral vascular angioplasty status with implants and grafts: Secondary | ICD-10-CM | POA: Diagnosis not present

## 2023-01-05 DIAGNOSIS — E1151 Type 2 diabetes mellitus with diabetic peripheral angiopathy without gangrene: Secondary | ICD-10-CM | POA: Diagnosis not present

## 2023-01-05 DIAGNOSIS — N400 Enlarged prostate without lower urinary tract symptoms: Secondary | ICD-10-CM | POA: Diagnosis not present

## 2023-01-05 DIAGNOSIS — I5030 Unspecified diastolic (congestive) heart failure: Secondary | ICD-10-CM | POA: Diagnosis not present

## 2023-01-05 DIAGNOSIS — E11621 Type 2 diabetes mellitus with foot ulcer: Secondary | ICD-10-CM | POA: Diagnosis not present

## 2023-01-05 DIAGNOSIS — R778 Other specified abnormalities of plasma proteins: Secondary | ICD-10-CM | POA: Diagnosis not present

## 2023-01-05 DIAGNOSIS — J9601 Acute respiratory failure with hypoxia: Secondary | ICD-10-CM | POA: Diagnosis not present

## 2023-01-05 DIAGNOSIS — D84821 Immunodeficiency due to drugs: Secondary | ICD-10-CM | POA: Diagnosis not present

## 2023-01-05 DIAGNOSIS — L97419 Non-pressure chronic ulcer of right heel and midfoot with unspecified severity: Secondary | ICD-10-CM | POA: Diagnosis not present

## 2023-01-05 DIAGNOSIS — J9602 Acute respiratory failure with hypercapnia: Secondary | ICD-10-CM | POA: Diagnosis not present

## 2023-01-05 DIAGNOSIS — I120 Hypertensive chronic kidney disease with stage 5 chronic kidney disease or end stage renal disease: Secondary | ICD-10-CM | POA: Diagnosis not present

## 2023-01-05 DIAGNOSIS — T8611 Kidney transplant rejection: Secondary | ICD-10-CM | POA: Diagnosis not present

## 2023-01-05 DIAGNOSIS — Z79621 Long term (current) use of calcineurin inhibitor: Secondary | ICD-10-CM | POA: Diagnosis not present

## 2023-01-05 DIAGNOSIS — L89610 Pressure ulcer of right heel, unstageable: Secondary | ICD-10-CM | POA: Diagnosis not present

## 2023-01-05 DIAGNOSIS — L97415 Non-pressure chronic ulcer of right heel and midfoot with muscle involvement without evidence of necrosis: Secondary | ICD-10-CM | POA: Diagnosis not present

## 2023-01-05 DIAGNOSIS — N179 Acute kidney failure, unspecified: Secondary | ICD-10-CM | POA: Diagnosis not present

## 2023-01-05 DIAGNOSIS — I10 Essential (primary) hypertension: Secondary | ICD-10-CM | POA: Diagnosis not present

## 2023-01-05 DIAGNOSIS — J810 Acute pulmonary edema: Secondary | ICD-10-CM | POA: Diagnosis not present

## 2023-01-06 DIAGNOSIS — E8770 Fluid overload, unspecified: Secondary | ICD-10-CM | POA: Diagnosis not present

## 2023-01-06 DIAGNOSIS — R0603 Acute respiratory distress: Secondary | ICD-10-CM | POA: Diagnosis not present

## 2023-01-06 DIAGNOSIS — Z4682 Encounter for fitting and adjustment of non-vascular catheter: Secondary | ICD-10-CM | POA: Diagnosis not present

## 2023-01-06 DIAGNOSIS — I119 Hypertensive heart disease without heart failure: Secondary | ICD-10-CM | POA: Diagnosis not present

## 2023-01-06 DIAGNOSIS — R4182 Altered mental status, unspecified: Secondary | ICD-10-CM | POA: Diagnosis not present

## 2023-01-06 DIAGNOSIS — E11621 Type 2 diabetes mellitus with foot ulcer: Secondary | ICD-10-CM | POA: Diagnosis not present

## 2023-01-06 DIAGNOSIS — N186 End stage renal disease: Secondary | ICD-10-CM | POA: Diagnosis not present

## 2023-01-06 DIAGNOSIS — Z940 Kidney transplant status: Secondary | ICD-10-CM | POA: Diagnosis not present

## 2023-01-06 DIAGNOSIS — I3139 Other pericardial effusion (noninflammatory): Secondary | ICD-10-CM | POA: Diagnosis not present

## 2023-01-06 DIAGNOSIS — N179 Acute kidney failure, unspecified: Secondary | ICD-10-CM | POA: Diagnosis not present

## 2023-01-06 DIAGNOSIS — I3481 Nonrheumatic mitral (valve) annulus calcification: Secondary | ICD-10-CM | POA: Diagnosis not present

## 2023-01-06 DIAGNOSIS — J9601 Acute respiratory failure with hypoxia: Secondary | ICD-10-CM | POA: Diagnosis not present

## 2023-01-06 DIAGNOSIS — I739 Peripheral vascular disease, unspecified: Secondary | ICD-10-CM | POA: Diagnosis not present

## 2023-01-06 DIAGNOSIS — I358 Other nonrheumatic aortic valve disorders: Secondary | ICD-10-CM | POA: Diagnosis not present

## 2023-01-06 DIAGNOSIS — R531 Weakness: Secondary | ICD-10-CM | POA: Diagnosis not present

## 2023-01-06 DIAGNOSIS — Z79621 Long term (current) use of calcineurin inhibitor: Secondary | ICD-10-CM | POA: Diagnosis not present

## 2023-01-06 DIAGNOSIS — I21A1 Myocardial infarction type 2: Secondary | ICD-10-CM | POA: Diagnosis not present

## 2023-01-06 DIAGNOSIS — I10 Essential (primary) hypertension: Secondary | ICD-10-CM | POA: Diagnosis not present

## 2023-01-06 DIAGNOSIS — D84821 Immunodeficiency due to drugs: Secondary | ICD-10-CM | POA: Diagnosis not present

## 2023-01-06 DIAGNOSIS — M869 Osteomyelitis, unspecified: Secondary | ICD-10-CM | POA: Diagnosis not present

## 2023-01-06 DIAGNOSIS — N170 Acute kidney failure with tubular necrosis: Secondary | ICD-10-CM | POA: Diagnosis not present

## 2023-01-06 DIAGNOSIS — I120 Hypertensive chronic kidney disease with stage 5 chronic kidney disease or end stage renal disease: Secondary | ICD-10-CM | POA: Diagnosis not present

## 2023-01-06 DIAGNOSIS — R918 Other nonspecific abnormal finding of lung field: Secondary | ICD-10-CM | POA: Diagnosis not present

## 2023-01-06 DIAGNOSIS — N400 Enlarged prostate without lower urinary tract symptoms: Secondary | ICD-10-CM | POA: Diagnosis not present

## 2023-01-06 DIAGNOSIS — R0600 Dyspnea, unspecified: Secondary | ICD-10-CM | POA: Diagnosis not present

## 2023-01-06 DIAGNOSIS — L97415 Non-pressure chronic ulcer of right heel and midfoot with muscle involvement without evidence of necrosis: Secondary | ICD-10-CM | POA: Diagnosis not present

## 2023-01-06 DIAGNOSIS — G9340 Encephalopathy, unspecified: Secondary | ICD-10-CM | POA: Diagnosis not present

## 2023-01-06 DIAGNOSIS — R778 Other specified abnormalities of plasma proteins: Secondary | ICD-10-CM | POA: Diagnosis not present

## 2023-01-06 DIAGNOSIS — L89610 Pressure ulcer of right heel, unstageable: Secondary | ICD-10-CM | POA: Diagnosis not present

## 2023-01-06 DIAGNOSIS — T8611 Kidney transplant rejection: Secondary | ICD-10-CM | POA: Diagnosis not present

## 2023-01-06 DIAGNOSIS — Z992 Dependence on renal dialysis: Secondary | ICD-10-CM | POA: Diagnosis not present

## 2023-01-07 DIAGNOSIS — G9340 Encephalopathy, unspecified: Secondary | ICD-10-CM | POA: Diagnosis not present

## 2023-01-07 DIAGNOSIS — M869 Osteomyelitis, unspecified: Secondary | ICD-10-CM | POA: Diagnosis not present

## 2023-01-07 DIAGNOSIS — Z940 Kidney transplant status: Secondary | ICD-10-CM | POA: Diagnosis not present

## 2023-01-07 DIAGNOSIS — N186 End stage renal disease: Secondary | ICD-10-CM | POA: Diagnosis not present

## 2023-01-07 DIAGNOSIS — R778 Other specified abnormalities of plasma proteins: Secondary | ICD-10-CM | POA: Diagnosis not present

## 2023-01-07 DIAGNOSIS — N170 Acute kidney failure with tubular necrosis: Secondary | ICD-10-CM | POA: Diagnosis not present

## 2023-01-07 DIAGNOSIS — R404 Transient alteration of awareness: Secondary | ICD-10-CM | POA: Diagnosis not present

## 2023-01-07 DIAGNOSIS — R0902 Hypoxemia: Secondary | ICD-10-CM | POA: Diagnosis not present

## 2023-01-07 DIAGNOSIS — R7989 Other specified abnormal findings of blood chemistry: Secondary | ICD-10-CM | POA: Diagnosis not present

## 2023-01-07 DIAGNOSIS — D84821 Immunodeficiency due to drugs: Secondary | ICD-10-CM | POA: Diagnosis not present

## 2023-01-07 DIAGNOSIS — E11621 Type 2 diabetes mellitus with foot ulcer: Secondary | ICD-10-CM | POA: Diagnosis not present

## 2023-01-07 DIAGNOSIS — I739 Peripheral vascular disease, unspecified: Secondary | ICD-10-CM | POA: Diagnosis not present

## 2023-01-07 DIAGNOSIS — N400 Enlarged prostate without lower urinary tract symptoms: Secondary | ICD-10-CM | POA: Diagnosis not present

## 2023-01-07 DIAGNOSIS — Z79621 Long term (current) use of calcineurin inhibitor: Secondary | ICD-10-CM | POA: Diagnosis not present

## 2023-01-07 DIAGNOSIS — J811 Chronic pulmonary edema: Secondary | ICD-10-CM | POA: Diagnosis not present

## 2023-01-07 DIAGNOSIS — I21A1 Myocardial infarction type 2: Secondary | ICD-10-CM | POA: Diagnosis not present

## 2023-01-07 DIAGNOSIS — L97419 Non-pressure chronic ulcer of right heel and midfoot with unspecified severity: Secondary | ICD-10-CM | POA: Diagnosis not present

## 2023-01-07 DIAGNOSIS — N179 Acute kidney failure, unspecified: Secondary | ICD-10-CM | POA: Diagnosis not present

## 2023-01-07 DIAGNOSIS — T8611 Kidney transplant rejection: Secondary | ICD-10-CM | POA: Diagnosis not present

## 2023-01-07 DIAGNOSIS — J9601 Acute respiratory failure with hypoxia: Secondary | ICD-10-CM | POA: Diagnosis not present

## 2023-01-07 DIAGNOSIS — J810 Acute pulmonary edema: Secondary | ICD-10-CM | POA: Diagnosis not present

## 2023-01-07 DIAGNOSIS — L97415 Non-pressure chronic ulcer of right heel and midfoot with muscle involvement without evidence of necrosis: Secondary | ICD-10-CM | POA: Diagnosis not present

## 2023-01-08 DIAGNOSIS — J810 Acute pulmonary edema: Secondary | ICD-10-CM | POA: Diagnosis not present

## 2023-01-08 DIAGNOSIS — J9601 Acute respiratory failure with hypoxia: Secondary | ICD-10-CM | POA: Diagnosis not present

## 2023-01-08 DIAGNOSIS — R778 Other specified abnormalities of plasma proteins: Secondary | ICD-10-CM | POA: Diagnosis not present

## 2023-01-08 DIAGNOSIS — E8770 Fluid overload, unspecified: Secondary | ICD-10-CM | POA: Diagnosis not present

## 2023-01-08 DIAGNOSIS — L97419 Non-pressure chronic ulcer of right heel and midfoot with unspecified severity: Secondary | ICD-10-CM | POA: Diagnosis not present

## 2023-01-08 DIAGNOSIS — L97415 Non-pressure chronic ulcer of right heel and midfoot with muscle involvement without evidence of necrosis: Secondary | ICD-10-CM | POA: Diagnosis not present

## 2023-01-08 DIAGNOSIS — Z940 Kidney transplant status: Secondary | ICD-10-CM | POA: Diagnosis not present

## 2023-01-08 DIAGNOSIS — N400 Enlarged prostate without lower urinary tract symptoms: Secondary | ICD-10-CM | POA: Diagnosis not present

## 2023-01-08 DIAGNOSIS — N186 End stage renal disease: Secondary | ICD-10-CM | POA: Diagnosis not present

## 2023-01-08 DIAGNOSIS — T8611 Kidney transplant rejection: Secondary | ICD-10-CM | POA: Diagnosis not present

## 2023-01-08 DIAGNOSIS — E11621 Type 2 diabetes mellitus with foot ulcer: Secondary | ICD-10-CM | POA: Diagnosis not present

## 2023-01-08 DIAGNOSIS — G9340 Encephalopathy, unspecified: Secondary | ICD-10-CM | POA: Diagnosis not present

## 2023-01-08 DIAGNOSIS — J811 Chronic pulmonary edema: Secondary | ICD-10-CM | POA: Diagnosis not present

## 2023-01-08 DIAGNOSIS — N179 Acute kidney failure, unspecified: Secondary | ICD-10-CM | POA: Diagnosis not present

## 2023-01-08 DIAGNOSIS — I120 Hypertensive chronic kidney disease with stage 5 chronic kidney disease or end stage renal disease: Secondary | ICD-10-CM | POA: Diagnosis not present

## 2023-01-09 DIAGNOSIS — L97415 Non-pressure chronic ulcer of right heel and midfoot with muscle involvement without evidence of necrosis: Secondary | ICD-10-CM | POA: Diagnosis not present

## 2023-01-09 DIAGNOSIS — Z940 Kidney transplant status: Secondary | ICD-10-CM | POA: Diagnosis not present

## 2023-01-09 DIAGNOSIS — J810 Acute pulmonary edema: Secondary | ICD-10-CM | POA: Diagnosis not present

## 2023-01-09 DIAGNOSIS — E8770 Fluid overload, unspecified: Secondary | ICD-10-CM | POA: Diagnosis not present

## 2023-01-09 DIAGNOSIS — E11621 Type 2 diabetes mellitus with foot ulcer: Secondary | ICD-10-CM | POA: Diagnosis not present

## 2023-01-09 DIAGNOSIS — I130 Hypertensive heart and chronic kidney disease with heart failure and stage 1 through stage 4 chronic kidney disease, or unspecified chronic kidney disease: Secondary | ICD-10-CM | POA: Diagnosis not present

## 2023-01-09 DIAGNOSIS — J9601 Acute respiratory failure with hypoxia: Secondary | ICD-10-CM | POA: Diagnosis not present

## 2023-01-09 DIAGNOSIS — I739 Peripheral vascular disease, unspecified: Secondary | ICD-10-CM | POA: Diagnosis not present

## 2023-01-09 DIAGNOSIS — N179 Acute kidney failure, unspecified: Secondary | ICD-10-CM | POA: Diagnosis not present

## 2023-01-09 DIAGNOSIS — M7989 Other specified soft tissue disorders: Secondary | ICD-10-CM | POA: Diagnosis not present

## 2023-01-09 DIAGNOSIS — R778 Other specified abnormalities of plasma proteins: Secondary | ICD-10-CM | POA: Diagnosis not present

## 2023-01-09 DIAGNOSIS — I5030 Unspecified diastolic (congestive) heart failure: Secondary | ICD-10-CM | POA: Diagnosis not present

## 2023-01-09 DIAGNOSIS — R29810 Facial weakness: Secondary | ICD-10-CM | POA: Diagnosis not present

## 2023-01-09 DIAGNOSIS — T8611 Kidney transplant rejection: Secondary | ICD-10-CM | POA: Diagnosis not present

## 2023-01-09 DIAGNOSIS — N186 End stage renal disease: Secondary | ICD-10-CM | POA: Diagnosis not present

## 2023-01-09 DIAGNOSIS — Z48298 Encounter for aftercare following other organ transplant: Principal | ICD-10-CM

## 2023-01-10 DIAGNOSIS — R7989 Other specified abnormal findings of blood chemistry: Secondary | ICD-10-CM | POA: Diagnosis not present

## 2023-01-10 DIAGNOSIS — N186 End stage renal disease: Secondary | ICD-10-CM | POA: Diagnosis not present

## 2023-01-10 DIAGNOSIS — Z79899 Other long term (current) drug therapy: Secondary | ICD-10-CM | POA: Diagnosis not present

## 2023-01-10 DIAGNOSIS — R918 Other nonspecific abnormal finding of lung field: Secondary | ICD-10-CM | POA: Diagnosis not present

## 2023-01-10 DIAGNOSIS — I130 Hypertensive heart and chronic kidney disease with heart failure and stage 1 through stage 4 chronic kidney disease, or unspecified chronic kidney disease: Secondary | ICD-10-CM | POA: Diagnosis not present

## 2023-01-10 DIAGNOSIS — J984 Other disorders of lung: Secondary | ICD-10-CM | POA: Diagnosis not present

## 2023-01-10 DIAGNOSIS — R0902 Hypoxemia: Secondary | ICD-10-CM | POA: Diagnosis not present

## 2023-01-10 DIAGNOSIS — I5030 Unspecified diastolic (congestive) heart failure: Secondary | ICD-10-CM | POA: Diagnosis not present

## 2023-01-10 DIAGNOSIS — D84821 Immunodeficiency due to drugs: Secondary | ICD-10-CM | POA: Diagnosis not present

## 2023-01-10 DIAGNOSIS — J810 Acute pulmonary edema: Secondary | ICD-10-CM | POA: Diagnosis not present

## 2023-01-10 DIAGNOSIS — I213 ST elevation (STEMI) myocardial infarction of unspecified site: Secondary | ICD-10-CM | POA: Diagnosis not present

## 2023-01-10 DIAGNOSIS — J9601 Acute respiratory failure with hypoxia: Secondary | ICD-10-CM | POA: Diagnosis not present

## 2023-01-10 DIAGNOSIS — J9600 Acute respiratory failure, unspecified whether with hypoxia or hypercapnia: Secondary | ICD-10-CM | POA: Diagnosis not present

## 2023-01-10 DIAGNOSIS — I739 Peripheral vascular disease, unspecified: Secondary | ICD-10-CM | POA: Diagnosis not present

## 2023-01-10 DIAGNOSIS — E8770 Fluid overload, unspecified: Secondary | ICD-10-CM | POA: Diagnosis not present

## 2023-01-10 DIAGNOSIS — N179 Acute kidney failure, unspecified: Secondary | ICD-10-CM | POA: Diagnosis not present

## 2023-01-10 DIAGNOSIS — I2510 Atherosclerotic heart disease of native coronary artery without angina pectoris: Secondary | ICD-10-CM | POA: Diagnosis not present

## 2023-01-10 DIAGNOSIS — I214 Non-ST elevation (NSTEMI) myocardial infarction: Secondary | ICD-10-CM | POA: Diagnosis not present

## 2023-01-10 DIAGNOSIS — Z940 Kidney transplant status: Secondary | ICD-10-CM | POA: Diagnosis not present

## 2023-01-10 DIAGNOSIS — R29810 Facial weakness: Secondary | ICD-10-CM | POA: Diagnosis not present

## 2023-01-10 DIAGNOSIS — G9340 Encephalopathy, unspecified: Secondary | ICD-10-CM | POA: Diagnosis not present

## 2023-01-10 DIAGNOSIS — R778 Other specified abnormalities of plasma proteins: Secondary | ICD-10-CM | POA: Diagnosis not present

## 2023-01-11 DIAGNOSIS — T8611 Kidney transplant rejection: Secondary | ICD-10-CM | POA: Diagnosis not present

## 2023-01-11 DIAGNOSIS — I739 Peripheral vascular disease, unspecified: Secondary | ICD-10-CM | POA: Diagnosis not present

## 2023-01-11 DIAGNOSIS — I130 Hypertensive heart and chronic kidney disease with heart failure and stage 1 through stage 4 chronic kidney disease, or unspecified chronic kidney disease: Secondary | ICD-10-CM | POA: Diagnosis not present

## 2023-01-11 DIAGNOSIS — I213 ST elevation (STEMI) myocardial infarction of unspecified site: Secondary | ICD-10-CM | POA: Diagnosis not present

## 2023-01-11 DIAGNOSIS — D84821 Immunodeficiency due to drugs: Secondary | ICD-10-CM | POA: Diagnosis not present

## 2023-01-11 DIAGNOSIS — N179 Acute kidney failure, unspecified: Secondary | ICD-10-CM | POA: Diagnosis not present

## 2023-01-11 DIAGNOSIS — G9340 Encephalopathy, unspecified: Secondary | ICD-10-CM | POA: Diagnosis not present

## 2023-01-11 DIAGNOSIS — R29810 Facial weakness: Secondary | ICD-10-CM | POA: Diagnosis not present

## 2023-01-11 DIAGNOSIS — E8770 Fluid overload, unspecified: Secondary | ICD-10-CM | POA: Diagnosis not present

## 2023-01-11 DIAGNOSIS — J9600 Acute respiratory failure, unspecified whether with hypoxia or hypercapnia: Secondary | ICD-10-CM | POA: Diagnosis not present

## 2023-01-11 DIAGNOSIS — N186 End stage renal disease: Secondary | ICD-10-CM | POA: Diagnosis not present

## 2023-01-11 DIAGNOSIS — J810 Acute pulmonary edema: Secondary | ICD-10-CM | POA: Diagnosis not present

## 2023-01-11 DIAGNOSIS — R7989 Other specified abnormal findings of blood chemistry: Secondary | ICD-10-CM | POA: Diagnosis not present

## 2023-01-11 DIAGNOSIS — J9601 Acute respiratory failure with hypoxia: Secondary | ICD-10-CM | POA: Diagnosis not present

## 2023-01-11 DIAGNOSIS — Z79621 Long term (current) use of calcineurin inhibitor: Secondary | ICD-10-CM | POA: Diagnosis not present

## 2023-01-11 DIAGNOSIS — Z940 Kidney transplant status: Secondary | ICD-10-CM | POA: Diagnosis not present

## 2023-01-11 DIAGNOSIS — I5030 Unspecified diastolic (congestive) heart failure: Secondary | ICD-10-CM | POA: Diagnosis not present

## 2023-01-12 DIAGNOSIS — B964 Proteus (mirabilis) (morganii) as the cause of diseases classified elsewhere: Secondary | ICD-10-CM | POA: Diagnosis not present

## 2023-01-12 DIAGNOSIS — E11621 Type 2 diabetes mellitus with foot ulcer: Secondary | ICD-10-CM | POA: Diagnosis not present

## 2023-01-12 DIAGNOSIS — B965 Pseudomonas (aeruginosa) (mallei) (pseudomallei) as the cause of diseases classified elsewhere: Secondary | ICD-10-CM | POA: Diagnosis not present

## 2023-01-12 DIAGNOSIS — Z4682 Encounter for fitting and adjustment of non-vascular catheter: Secondary | ICD-10-CM | POA: Diagnosis not present

## 2023-01-12 DIAGNOSIS — I5189 Other ill-defined heart diseases: Secondary | ICD-10-CM | POA: Diagnosis not present

## 2023-01-12 DIAGNOSIS — Z452 Encounter for adjustment and management of vascular access device: Secondary | ICD-10-CM | POA: Diagnosis not present

## 2023-01-12 DIAGNOSIS — T8611 Kidney transplant rejection: Secondary | ICD-10-CM | POA: Diagnosis not present

## 2023-01-12 DIAGNOSIS — M86171 Other acute osteomyelitis, right ankle and foot: Secondary | ICD-10-CM | POA: Diagnosis not present

## 2023-01-12 DIAGNOSIS — D649 Anemia, unspecified: Secondary | ICD-10-CM | POA: Diagnosis not present

## 2023-01-12 DIAGNOSIS — N179 Acute kidney failure, unspecified: Secondary | ICD-10-CM | POA: Diagnosis not present

## 2023-01-12 DIAGNOSIS — L97415 Non-pressure chronic ulcer of right heel and midfoot with muscle involvement without evidence of necrosis: Secondary | ICD-10-CM | POA: Diagnosis not present

## 2023-01-13 DIAGNOSIS — B964 Proteus (mirabilis) (morganii) as the cause of diseases classified elsewhere: Secondary | ICD-10-CM | POA: Diagnosis not present

## 2023-01-13 DIAGNOSIS — D649 Anemia, unspecified: Secondary | ICD-10-CM | POA: Diagnosis not present

## 2023-01-13 DIAGNOSIS — L97419 Non-pressure chronic ulcer of right heel and midfoot with unspecified severity: Secondary | ICD-10-CM | POA: Diagnosis not present

## 2023-01-13 DIAGNOSIS — I5189 Other ill-defined heart diseases: Secondary | ICD-10-CM | POA: Diagnosis not present

## 2023-01-13 DIAGNOSIS — B965 Pseudomonas (aeruginosa) (mallei) (pseudomallei) as the cause of diseases classified elsewhere: Secondary | ICD-10-CM | POA: Diagnosis not present

## 2023-01-13 DIAGNOSIS — N179 Acute kidney failure, unspecified: Secondary | ICD-10-CM | POA: Diagnosis not present

## 2023-01-13 DIAGNOSIS — T8611 Kidney transplant rejection: Secondary | ICD-10-CM | POA: Diagnosis not present

## 2023-01-13 DIAGNOSIS — M86171 Other acute osteomyelitis, right ankle and foot: Secondary | ICD-10-CM | POA: Diagnosis not present

## 2023-01-14 DIAGNOSIS — Z992 Dependence on renal dialysis: Secondary | ICD-10-CM | POA: Diagnosis not present

## 2023-01-14 DIAGNOSIS — J810 Acute pulmonary edema: Secondary | ICD-10-CM | POA: Diagnosis not present

## 2023-01-14 DIAGNOSIS — D649 Anemia, unspecified: Secondary | ICD-10-CM | POA: Diagnosis not present

## 2023-01-14 DIAGNOSIS — J9819 Other pulmonary collapse: Secondary | ICD-10-CM | POA: Diagnosis not present

## 2023-01-14 DIAGNOSIS — Z452 Encounter for adjustment and management of vascular access device: Secondary | ICD-10-CM | POA: Diagnosis not present

## 2023-01-14 DIAGNOSIS — N179 Acute kidney failure, unspecified: Secondary | ICD-10-CM | POA: Diagnosis not present

## 2023-01-14 DIAGNOSIS — J9601 Acute respiratory failure with hypoxia: Secondary | ICD-10-CM | POA: Diagnosis not present

## 2023-01-15 DIAGNOSIS — B965 Pseudomonas (aeruginosa) (mallei) (pseudomallei) as the cause of diseases classified elsewhere: Secondary | ICD-10-CM | POA: Diagnosis not present

## 2023-01-15 DIAGNOSIS — M86171 Other acute osteomyelitis, right ankle and foot: Secondary | ICD-10-CM | POA: Diagnosis not present

## 2023-01-15 DIAGNOSIS — T8611 Kidney transplant rejection: Secondary | ICD-10-CM | POA: Diagnosis not present

## 2023-01-15 DIAGNOSIS — L97419 Non-pressure chronic ulcer of right heel and midfoot with unspecified severity: Secondary | ICD-10-CM | POA: Diagnosis not present

## 2023-01-15 DIAGNOSIS — B964 Proteus (mirabilis) (morganii) as the cause of diseases classified elsewhere: Secondary | ICD-10-CM | POA: Diagnosis not present

## 2023-01-16 DIAGNOSIS — T8611 Kidney transplant rejection: Secondary | ICD-10-CM | POA: Diagnosis not present

## 2023-01-16 DIAGNOSIS — J984 Other disorders of lung: Secondary | ICD-10-CM | POA: Diagnosis not present

## 2023-01-16 DIAGNOSIS — L97419 Non-pressure chronic ulcer of right heel and midfoot with unspecified severity: Secondary | ICD-10-CM | POA: Diagnosis not present

## 2023-01-16 DIAGNOSIS — B964 Proteus (mirabilis) (morganii) as the cause of diseases classified elsewhere: Secondary | ICD-10-CM | POA: Diagnosis not present

## 2023-01-16 DIAGNOSIS — M86171 Other acute osteomyelitis, right ankle and foot: Secondary | ICD-10-CM | POA: Diagnosis not present

## 2023-01-16 DIAGNOSIS — R0902 Hypoxemia: Secondary | ICD-10-CM | POA: Diagnosis not present

## 2023-01-16 DIAGNOSIS — R918 Other nonspecific abnormal finding of lung field: Secondary | ICD-10-CM | POA: Diagnosis not present

## 2023-01-16 DIAGNOSIS — B965 Pseudomonas (aeruginosa) (mallei) (pseudomallei) as the cause of diseases classified elsewhere: Secondary | ICD-10-CM | POA: Diagnosis not present

## 2023-01-17 DIAGNOSIS — L97415 Non-pressure chronic ulcer of right heel and midfoot with muscle involvement without evidence of necrosis: Secondary | ICD-10-CM | POA: Diagnosis not present

## 2023-01-17 DIAGNOSIS — M86171 Other acute osteomyelitis, right ankle and foot: Secondary | ICD-10-CM | POA: Diagnosis not present

## 2023-01-17 DIAGNOSIS — B964 Proteus (mirabilis) (morganii) as the cause of diseases classified elsewhere: Secondary | ICD-10-CM | POA: Diagnosis not present

## 2023-01-17 DIAGNOSIS — D649 Anemia, unspecified: Secondary | ICD-10-CM | POA: Diagnosis not present

## 2023-01-17 DIAGNOSIS — B965 Pseudomonas (aeruginosa) (mallei) (pseudomallei) as the cause of diseases classified elsewhere: Secondary | ICD-10-CM | POA: Diagnosis not present

## 2023-01-17 DIAGNOSIS — N179 Acute kidney failure, unspecified: Secondary | ICD-10-CM | POA: Diagnosis not present

## 2023-01-17 DIAGNOSIS — T8611 Kidney transplant rejection: Secondary | ICD-10-CM | POA: Diagnosis not present

## 2023-01-17 DIAGNOSIS — L97419 Non-pressure chronic ulcer of right heel and midfoot with unspecified severity: Secondary | ICD-10-CM | POA: Diagnosis not present

## 2023-01-17 DIAGNOSIS — E11621 Type 2 diabetes mellitus with foot ulcer: Secondary | ICD-10-CM | POA: Diagnosis not present

## 2023-01-17 MED ORDER — SEVELAMER HCL 800 MG TABLET
ORAL_TABLET | Freq: Three times a day (TID) | ORAL | 0 refills | 30 days
Start: 2023-01-17 — End: 2023-01-17

## 2023-01-18 DIAGNOSIS — L89619 Pressure ulcer of right heel, unspecified stage: Secondary | ICD-10-CM | POA: Diagnosis not present

## 2023-01-18 DIAGNOSIS — M86171 Other acute osteomyelitis, right ankle and foot: Secondary | ICD-10-CM | POA: Diagnosis not present

## 2023-01-18 DIAGNOSIS — R0902 Hypoxemia: Secondary | ICD-10-CM | POA: Diagnosis not present

## 2023-01-18 DIAGNOSIS — N186 End stage renal disease: Secondary | ICD-10-CM | POA: Diagnosis not present

## 2023-01-18 DIAGNOSIS — N179 Acute kidney failure, unspecified: Secondary | ICD-10-CM | POA: Diagnosis not present

## 2023-01-19 DIAGNOSIS — E11621 Type 2 diabetes mellitus with foot ulcer: Secondary | ICD-10-CM | POA: Diagnosis not present

## 2023-01-19 DIAGNOSIS — J9601 Acute respiratory failure with hypoxia: Secondary | ICD-10-CM | POA: Diagnosis not present

## 2023-01-19 DIAGNOSIS — L97415 Non-pressure chronic ulcer of right heel and midfoot with muscle involvement without evidence of necrosis: Secondary | ICD-10-CM | POA: Diagnosis not present

## 2023-01-19 DIAGNOSIS — M85871 Other specified disorders of bone density and structure, right ankle and foot: Secondary | ICD-10-CM | POA: Diagnosis not present

## 2023-01-19 DIAGNOSIS — L89619 Pressure ulcer of right heel, unspecified stage: Secondary | ICD-10-CM | POA: Diagnosis not present

## 2023-01-19 DIAGNOSIS — M86171 Other acute osteomyelitis, right ankle and foot: Secondary | ICD-10-CM | POA: Diagnosis not present

## 2023-01-19 DIAGNOSIS — Z89411 Acquired absence of right great toe: Secondary | ICD-10-CM | POA: Diagnosis not present

## 2023-01-19 DIAGNOSIS — D649 Anemia, unspecified: Secondary | ICD-10-CM | POA: Diagnosis not present

## 2023-01-19 DIAGNOSIS — L97414 Non-pressure chronic ulcer of right heel and midfoot with necrosis of bone: Secondary | ICD-10-CM | POA: Diagnosis not present

## 2023-01-19 DIAGNOSIS — N179 Acute kidney failure, unspecified: Secondary | ICD-10-CM | POA: Diagnosis not present

## 2023-01-20 DIAGNOSIS — J9601 Acute respiratory failure with hypoxia: Secondary | ICD-10-CM | POA: Diagnosis not present

## 2023-01-20 DIAGNOSIS — N179 Acute kidney failure, unspecified: Secondary | ICD-10-CM | POA: Diagnosis not present

## 2023-01-20 DIAGNOSIS — L97415 Non-pressure chronic ulcer of right heel and midfoot with muscle involvement without evidence of necrosis: Secondary | ICD-10-CM | POA: Diagnosis not present

## 2023-01-20 DIAGNOSIS — E11621 Type 2 diabetes mellitus with foot ulcer: Secondary | ICD-10-CM | POA: Diagnosis not present

## 2023-01-20 DIAGNOSIS — G9340 Encephalopathy, unspecified: Secondary | ICD-10-CM | POA: Diagnosis not present

## 2023-01-20 DIAGNOSIS — T8611 Kidney transplant rejection: Secondary | ICD-10-CM | POA: Diagnosis not present

## 2023-01-21 DIAGNOSIS — J90 Pleural effusion, not elsewhere classified: Secondary | ICD-10-CM | POA: Diagnosis not present

## 2023-01-21 DIAGNOSIS — J811 Chronic pulmonary edema: Secondary | ICD-10-CM | POA: Diagnosis not present

## 2023-01-21 DIAGNOSIS — I739 Peripheral vascular disease, unspecified: Secondary | ICD-10-CM | POA: Diagnosis not present

## 2023-01-21 DIAGNOSIS — D649 Anemia, unspecified: Secondary | ICD-10-CM | POA: Diagnosis not present

## 2023-01-21 DIAGNOSIS — Z992 Dependence on renal dialysis: Secondary | ICD-10-CM | POA: Diagnosis not present

## 2023-01-21 DIAGNOSIS — T8611 Kidney transplant rejection: Secondary | ICD-10-CM | POA: Diagnosis not present

## 2023-01-21 DIAGNOSIS — N179 Acute kidney failure, unspecified: Secondary | ICD-10-CM | POA: Diagnosis not present

## 2023-01-21 DIAGNOSIS — J9601 Acute respiratory failure with hypoxia: Secondary | ICD-10-CM | POA: Diagnosis not present

## 2023-01-21 DIAGNOSIS — D631 Anemia in chronic kidney disease: Secondary | ICD-10-CM | POA: Diagnosis not present

## 2023-01-21 DIAGNOSIS — R0902 Hypoxemia: Secondary | ICD-10-CM | POA: Diagnosis not present

## 2023-01-22 DIAGNOSIS — Z992 Dependence on renal dialysis: Secondary | ICD-10-CM | POA: Diagnosis not present

## 2023-01-22 DIAGNOSIS — N179 Acute kidney failure, unspecified: Secondary | ICD-10-CM | POA: Diagnosis not present

## 2023-01-22 DIAGNOSIS — M86171 Other acute osteomyelitis, right ankle and foot: Secondary | ICD-10-CM | POA: Diagnosis not present

## 2023-01-22 DIAGNOSIS — N186 End stage renal disease: Secondary | ICD-10-CM | POA: Diagnosis not present

## 2023-01-22 DIAGNOSIS — D649 Anemia, unspecified: Secondary | ICD-10-CM | POA: Diagnosis not present

## 2023-01-22 DIAGNOSIS — E1169 Type 2 diabetes mellitus with other specified complication: Secondary | ICD-10-CM | POA: Diagnosis not present

## 2023-01-23 DIAGNOSIS — Z992 Dependence on renal dialysis: Secondary | ICD-10-CM | POA: Diagnosis not present

## 2023-01-23 DIAGNOSIS — N186 End stage renal disease: Secondary | ICD-10-CM | POA: Diagnosis not present

## 2023-01-23 DIAGNOSIS — N179 Acute kidney failure, unspecified: Secondary | ICD-10-CM | POA: Diagnosis not present

## 2023-01-23 DIAGNOSIS — E1169 Type 2 diabetes mellitus with other specified complication: Secondary | ICD-10-CM | POA: Diagnosis not present

## 2023-01-23 DIAGNOSIS — I255 Ischemic cardiomyopathy: Secondary | ICD-10-CM | POA: Diagnosis not present

## 2023-01-23 DIAGNOSIS — I214 Non-ST elevation (NSTEMI) myocardial infarction: Secondary | ICD-10-CM | POA: Diagnosis not present

## 2023-01-23 DIAGNOSIS — M86171 Other acute osteomyelitis, right ankle and foot: Secondary | ICD-10-CM | POA: Diagnosis not present

## 2023-01-23 DIAGNOSIS — T8611 Kidney transplant rejection: Principal | ICD-10-CM

## 2023-01-23 DIAGNOSIS — Z48298 Encounter for aftercare following other organ transplant: Principal | ICD-10-CM

## 2023-01-24 DIAGNOSIS — M86171 Other acute osteomyelitis, right ankle and foot: Secondary | ICD-10-CM | POA: Diagnosis not present

## 2023-01-24 DIAGNOSIS — N186 End stage renal disease: Secondary | ICD-10-CM | POA: Diagnosis not present

## 2023-01-24 DIAGNOSIS — I340 Nonrheumatic mitral (valve) insufficiency: Secondary | ICD-10-CM | POA: Diagnosis not present

## 2023-01-24 DIAGNOSIS — I214 Non-ST elevation (NSTEMI) myocardial infarction: Secondary | ICD-10-CM | POA: Diagnosis not present

## 2023-01-24 DIAGNOSIS — E1169 Type 2 diabetes mellitus with other specified complication: Secondary | ICD-10-CM | POA: Diagnosis not present

## 2023-01-24 DIAGNOSIS — I2510 Atherosclerotic heart disease of native coronary artery without angina pectoris: Secondary | ICD-10-CM | POA: Diagnosis not present

## 2023-01-24 DIAGNOSIS — N179 Acute kidney failure, unspecified: Secondary | ICD-10-CM | POA: Diagnosis not present

## 2023-01-24 DIAGNOSIS — Z992 Dependence on renal dialysis: Secondary | ICD-10-CM | POA: Diagnosis not present

## 2023-01-25 DIAGNOSIS — Z992 Dependence on renal dialysis: Secondary | ICD-10-CM | POA: Diagnosis not present

## 2023-01-25 DIAGNOSIS — N179 Acute kidney failure, unspecified: Secondary | ICD-10-CM | POA: Diagnosis not present

## 2023-01-25 DIAGNOSIS — D649 Anemia, unspecified: Secondary | ICD-10-CM | POA: Diagnosis not present

## 2023-01-25 DIAGNOSIS — M8610 Other acute osteomyelitis, unspecified site: Secondary | ICD-10-CM | POA: Diagnosis not present

## 2023-01-25 DIAGNOSIS — Z940 Kidney transplant status: Secondary | ICD-10-CM | POA: Diagnosis not present

## 2023-01-25 DIAGNOSIS — I255 Ischemic cardiomyopathy: Secondary | ICD-10-CM | POA: Diagnosis not present

## 2023-01-25 DIAGNOSIS — I214 Non-ST elevation (NSTEMI) myocardial infarction: Secondary | ICD-10-CM | POA: Diagnosis not present

## 2023-01-26 DIAGNOSIS — N179 Acute kidney failure, unspecified: Secondary | ICD-10-CM | POA: Diagnosis not present

## 2023-01-26 DIAGNOSIS — Z79621 Long term (current) use of calcineurin inhibitor: Secondary | ICD-10-CM | POA: Diagnosis not present

## 2023-01-26 DIAGNOSIS — D84821 Immunodeficiency due to drugs: Secondary | ICD-10-CM | POA: Diagnosis not present

## 2023-01-26 DIAGNOSIS — M8610 Other acute osteomyelitis, unspecified site: Secondary | ICD-10-CM | POA: Diagnosis not present

## 2023-01-26 DIAGNOSIS — Z940 Kidney transplant status: Secondary | ICD-10-CM | POA: Diagnosis not present

## 2023-01-26 DIAGNOSIS — I10 Essential (primary) hypertension: Secondary | ICD-10-CM | POA: Diagnosis not present

## 2023-01-26 DIAGNOSIS — J9601 Acute respiratory failure with hypoxia: Secondary | ICD-10-CM | POA: Diagnosis not present

## 2023-01-27 DIAGNOSIS — T8611 Kidney transplant rejection: Secondary | ICD-10-CM | POA: Diagnosis not present

## 2023-01-27 DIAGNOSIS — L97415 Non-pressure chronic ulcer of right heel and midfoot with muscle involvement without evidence of necrosis: Secondary | ICD-10-CM | POA: Diagnosis not present

## 2023-01-27 DIAGNOSIS — Z940 Kidney transplant status: Secondary | ICD-10-CM | POA: Diagnosis not present

## 2023-01-27 DIAGNOSIS — N179 Acute kidney failure, unspecified: Secondary | ICD-10-CM | POA: Diagnosis not present

## 2023-01-27 DIAGNOSIS — N186 End stage renal disease: Secondary | ICD-10-CM | POA: Diagnosis not present

## 2023-01-27 DIAGNOSIS — I214 Non-ST elevation (NSTEMI) myocardial infarction: Secondary | ICD-10-CM | POA: Diagnosis not present

## 2023-01-27 DIAGNOSIS — I255 Ischemic cardiomyopathy: Secondary | ICD-10-CM | POA: Diagnosis not present

## 2023-01-27 DIAGNOSIS — D649 Anemia, unspecified: Secondary | ICD-10-CM | POA: Diagnosis not present

## 2023-01-27 DIAGNOSIS — M8610 Other acute osteomyelitis, unspecified site: Secondary | ICD-10-CM | POA: Diagnosis not present

## 2023-01-27 DIAGNOSIS — E11621 Type 2 diabetes mellitus with foot ulcer: Secondary | ICD-10-CM | POA: Diagnosis not present

## 2023-01-27 DIAGNOSIS — Z992 Dependence on renal dialysis: Secondary | ICD-10-CM | POA: Diagnosis not present

## 2023-01-28 DIAGNOSIS — M8610 Other acute osteomyelitis, unspecified site: Secondary | ICD-10-CM | POA: Diagnosis not present

## 2023-01-28 DIAGNOSIS — N186 End stage renal disease: Secondary | ICD-10-CM | POA: Diagnosis not present

## 2023-01-28 DIAGNOSIS — T8611 Kidney transplant rejection: Secondary | ICD-10-CM | POA: Diagnosis not present

## 2023-01-28 DIAGNOSIS — Z992 Dependence on renal dialysis: Secondary | ICD-10-CM | POA: Diagnosis not present

## 2023-01-29 DIAGNOSIS — L89611 Pressure ulcer of right heel, stage 1: Secondary | ICD-10-CM | POA: Diagnosis not present

## 2023-01-29 DIAGNOSIS — D649 Anemia, unspecified: Secondary | ICD-10-CM | POA: Diagnosis not present

## 2023-01-29 DIAGNOSIS — M8610 Other acute osteomyelitis, unspecified site: Secondary | ICD-10-CM | POA: Diagnosis not present

## 2023-01-29 DIAGNOSIS — E11621 Type 2 diabetes mellitus with foot ulcer: Secondary | ICD-10-CM | POA: Diagnosis not present

## 2023-01-29 DIAGNOSIS — N179 Acute kidney failure, unspecified: Secondary | ICD-10-CM | POA: Diagnosis not present

## 2023-01-29 DIAGNOSIS — E1151 Type 2 diabetes mellitus with diabetic peripheral angiopathy without gangrene: Secondary | ICD-10-CM | POA: Diagnosis not present

## 2023-01-30 DIAGNOSIS — M8610 Other acute osteomyelitis, unspecified site: Secondary | ICD-10-CM | POA: Diagnosis not present

## 2023-01-30 DIAGNOSIS — N186 End stage renal disease: Secondary | ICD-10-CM | POA: Diagnosis not present

## 2023-01-30 DIAGNOSIS — Z992 Dependence on renal dialysis: Secondary | ICD-10-CM | POA: Diagnosis not present

## 2023-01-30 DIAGNOSIS — L97415 Non-pressure chronic ulcer of right heel and midfoot with muscle involvement without evidence of necrosis: Secondary | ICD-10-CM | POA: Diagnosis not present

## 2023-01-30 DIAGNOSIS — E11621 Type 2 diabetes mellitus with foot ulcer: Secondary | ICD-10-CM | POA: Diagnosis not present

## 2023-01-30 DIAGNOSIS — T8611 Kidney transplant rejection: Secondary | ICD-10-CM | POA: Diagnosis not present

## 2023-01-31 DIAGNOSIS — L89611 Pressure ulcer of right heel, stage 1: Secondary | ICD-10-CM | POA: Diagnosis not present

## 2023-01-31 DIAGNOSIS — E1151 Type 2 diabetes mellitus with diabetic peripheral angiopathy without gangrene: Secondary | ICD-10-CM | POA: Diagnosis not present

## 2023-01-31 DIAGNOSIS — E8770 Fluid overload, unspecified: Secondary | ICD-10-CM | POA: Diagnosis not present

## 2023-01-31 DIAGNOSIS — M8610 Other acute osteomyelitis, unspecified site: Secondary | ICD-10-CM | POA: Diagnosis not present

## 2023-01-31 DIAGNOSIS — Z992 Dependence on renal dialysis: Secondary | ICD-10-CM | POA: Diagnosis not present

## 2023-01-31 DIAGNOSIS — J810 Acute pulmonary edema: Secondary | ICD-10-CM | POA: Diagnosis not present

## 2023-01-31 DIAGNOSIS — T8611 Kidney transplant rejection: Secondary | ICD-10-CM | POA: Diagnosis not present

## 2023-01-31 DIAGNOSIS — Z940 Kidney transplant status: Secondary | ICD-10-CM | POA: Diagnosis not present

## 2023-01-31 DIAGNOSIS — M8618 Other acute osteomyelitis, other site: Secondary | ICD-10-CM | POA: Diagnosis not present

## 2023-01-31 DIAGNOSIS — J9600 Acute respiratory failure, unspecified whether with hypoxia or hypercapnia: Secondary | ICD-10-CM | POA: Diagnosis not present

## 2023-01-31 DIAGNOSIS — E11621 Type 2 diabetes mellitus with foot ulcer: Secondary | ICD-10-CM | POA: Diagnosis not present

## 2023-01-31 DIAGNOSIS — I5030 Unspecified diastolic (congestive) heart failure: Secondary | ICD-10-CM | POA: Diagnosis not present

## 2023-01-31 DIAGNOSIS — Z79621 Long term (current) use of calcineurin inhibitor: Secondary | ICD-10-CM | POA: Diagnosis not present

## 2023-01-31 DIAGNOSIS — I132 Hypertensive heart and chronic kidney disease with heart failure and with stage 5 chronic kidney disease, or end stage renal disease: Secondary | ICD-10-CM | POA: Diagnosis not present

## 2023-01-31 DIAGNOSIS — D84821 Immunodeficiency due to drugs: Secondary | ICD-10-CM | POA: Diagnosis not present

## 2023-01-31 DIAGNOSIS — N186 End stage renal disease: Secondary | ICD-10-CM | POA: Diagnosis not present

## 2023-01-31 DIAGNOSIS — I739 Peripheral vascular disease, unspecified: Secondary | ICD-10-CM | POA: Diagnosis not present

## 2023-02-01 DIAGNOSIS — D649 Anemia, unspecified: Secondary | ICD-10-CM | POA: Diagnosis not present

## 2023-02-01 DIAGNOSIS — L89611 Pressure ulcer of right heel, stage 1: Secondary | ICD-10-CM | POA: Diagnosis not present

## 2023-02-01 DIAGNOSIS — E1151 Type 2 diabetes mellitus with diabetic peripheral angiopathy without gangrene: Secondary | ICD-10-CM | POA: Diagnosis not present

## 2023-02-01 DIAGNOSIS — N179 Acute kidney failure, unspecified: Secondary | ICD-10-CM | POA: Diagnosis not present

## 2023-02-01 DIAGNOSIS — M8610 Other acute osteomyelitis, unspecified site: Secondary | ICD-10-CM | POA: Diagnosis not present

## 2023-02-01 DIAGNOSIS — Z9862 Peripheral vascular angioplasty status: Secondary | ICD-10-CM | POA: Diagnosis not present

## 2023-02-01 DIAGNOSIS — E11621 Type 2 diabetes mellitus with foot ulcer: Secondary | ICD-10-CM | POA: Diagnosis not present

## 2023-02-02 DIAGNOSIS — L89611 Pressure ulcer of right heel, stage 1: Secondary | ICD-10-CM | POA: Diagnosis not present

## 2023-02-02 DIAGNOSIS — E11621 Type 2 diabetes mellitus with foot ulcer: Secondary | ICD-10-CM | POA: Diagnosis not present

## 2023-02-02 DIAGNOSIS — E1151 Type 2 diabetes mellitus with diabetic peripheral angiopathy without gangrene: Secondary | ICD-10-CM | POA: Diagnosis not present

## 2023-02-02 DIAGNOSIS — M8610 Other acute osteomyelitis, unspecified site: Secondary | ICD-10-CM | POA: Diagnosis not present

## 2023-02-03 DIAGNOSIS — N186 End stage renal disease: Secondary | ICD-10-CM | POA: Diagnosis not present

## 2023-02-03 DIAGNOSIS — M869 Osteomyelitis, unspecified: Secondary | ICD-10-CM | POA: Diagnosis not present

## 2023-02-03 DIAGNOSIS — T8611 Kidney transplant rejection: Secondary | ICD-10-CM | POA: Diagnosis not present

## 2023-02-03 DIAGNOSIS — G8918 Other acute postprocedural pain: Secondary | ICD-10-CM | POA: Diagnosis not present

## 2023-02-03 DIAGNOSIS — L97419 Non-pressure chronic ulcer of right heel and midfoot with unspecified severity: Secondary | ICD-10-CM | POA: Diagnosis not present

## 2023-02-03 DIAGNOSIS — D649 Anemia, unspecified: Secondary | ICD-10-CM | POA: Diagnosis not present

## 2023-02-03 DIAGNOSIS — E1169 Type 2 diabetes mellitus with other specified complication: Secondary | ICD-10-CM | POA: Diagnosis not present

## 2023-02-03 DIAGNOSIS — M8610 Other acute osteomyelitis, unspecified site: Secondary | ICD-10-CM | POA: Diagnosis not present

## 2023-02-03 DIAGNOSIS — N179 Acute kidney failure, unspecified: Secondary | ICD-10-CM | POA: Diagnosis not present

## 2023-02-04 DIAGNOSIS — L97419 Non-pressure chronic ulcer of right heel and midfoot with unspecified severity: Secondary | ICD-10-CM | POA: Diagnosis not present

## 2023-02-04 DIAGNOSIS — J9600 Acute respiratory failure, unspecified whether with hypoxia or hypercapnia: Secondary | ICD-10-CM | POA: Diagnosis not present

## 2023-02-04 DIAGNOSIS — G8918 Other acute postprocedural pain: Secondary | ICD-10-CM | POA: Diagnosis not present

## 2023-02-04 DIAGNOSIS — M86171 Other acute osteomyelitis, right ankle and foot: Secondary | ICD-10-CM | POA: Diagnosis not present

## 2023-02-04 DIAGNOSIS — Z89511 Acquired absence of right leg below knee: Secondary | ICD-10-CM | POA: Diagnosis not present

## 2023-02-04 DIAGNOSIS — T8611 Kidney transplant rejection: Secondary | ICD-10-CM | POA: Diagnosis not present

## 2023-02-04 DIAGNOSIS — I10 Essential (primary) hypertension: Secondary | ICD-10-CM | POA: Diagnosis not present

## 2023-02-04 DIAGNOSIS — E119 Type 2 diabetes mellitus without complications: Secondary | ICD-10-CM | POA: Diagnosis not present

## 2023-02-04 DIAGNOSIS — N186 End stage renal disease: Secondary | ICD-10-CM | POA: Diagnosis not present

## 2023-02-04 DIAGNOSIS — I214 Non-ST elevation (NSTEMI) myocardial infarction: Secondary | ICD-10-CM | POA: Diagnosis not present

## 2023-02-04 DIAGNOSIS — I5030 Unspecified diastolic (congestive) heart failure: Secondary | ICD-10-CM | POA: Diagnosis not present

## 2023-02-05 DIAGNOSIS — D631 Anemia in chronic kidney disease: Secondary | ICD-10-CM | POA: Diagnosis not present

## 2023-02-05 DIAGNOSIS — Z992 Dependence on renal dialysis: Secondary | ICD-10-CM | POA: Diagnosis not present

## 2023-02-05 DIAGNOSIS — L97415 Non-pressure chronic ulcer of right heel and midfoot with muscle involvement without evidence of necrosis: Secondary | ICD-10-CM | POA: Diagnosis not present

## 2023-02-05 DIAGNOSIS — N186 End stage renal disease: Secondary | ICD-10-CM | POA: Diagnosis not present

## 2023-02-05 DIAGNOSIS — M8610 Other acute osteomyelitis, unspecified site: Secondary | ICD-10-CM | POA: Diagnosis not present

## 2023-02-05 DIAGNOSIS — T8611 Kidney transplant rejection: Secondary | ICD-10-CM | POA: Diagnosis not present

## 2023-02-05 DIAGNOSIS — L97419 Non-pressure chronic ulcer of right heel and midfoot with unspecified severity: Secondary | ICD-10-CM | POA: Diagnosis not present

## 2023-02-05 DIAGNOSIS — G8918 Other acute postprocedural pain: Secondary | ICD-10-CM | POA: Diagnosis not present

## 2023-02-05 DIAGNOSIS — E11621 Type 2 diabetes mellitus with foot ulcer: Secondary | ICD-10-CM | POA: Diagnosis not present

## 2023-02-05 DIAGNOSIS — Z89511 Acquired absence of right leg below knee: Secondary | ICD-10-CM | POA: Diagnosis not present

## 2023-02-06 DIAGNOSIS — T8611 Kidney transplant rejection: Secondary | ICD-10-CM | POA: Diagnosis not present

## 2023-02-06 DIAGNOSIS — L97419 Non-pressure chronic ulcer of right heel and midfoot with unspecified severity: Secondary | ICD-10-CM | POA: Diagnosis not present

## 2023-02-06 DIAGNOSIS — Z89511 Acquired absence of right leg below knee: Secondary | ICD-10-CM | POA: Diagnosis not present

## 2023-02-06 DIAGNOSIS — N186 End stage renal disease: Secondary | ICD-10-CM | POA: Diagnosis not present

## 2023-02-06 DIAGNOSIS — M8610 Other acute osteomyelitis, unspecified site: Secondary | ICD-10-CM | POA: Diagnosis not present

## 2023-02-06 DIAGNOSIS — G8918 Other acute postprocedural pain: Secondary | ICD-10-CM | POA: Diagnosis not present

## 2023-02-07 DIAGNOSIS — L97419 Non-pressure chronic ulcer of right heel and midfoot with unspecified severity: Secondary | ICD-10-CM | POA: Diagnosis not present

## 2023-02-07 DIAGNOSIS — Z940 Kidney transplant status: Secondary | ICD-10-CM | POA: Diagnosis not present

## 2023-02-07 DIAGNOSIS — T8611 Kidney transplant rejection: Secondary | ICD-10-CM | POA: Diagnosis not present

## 2023-02-07 DIAGNOSIS — Z992 Dependence on renal dialysis: Secondary | ICD-10-CM | POA: Diagnosis not present

## 2023-02-07 DIAGNOSIS — N186 End stage renal disease: Secondary | ICD-10-CM | POA: Diagnosis not present

## 2023-02-07 DIAGNOSIS — Z89511 Acquired absence of right leg below knee: Secondary | ICD-10-CM | POA: Diagnosis not present

## 2023-02-07 DIAGNOSIS — D631 Anemia in chronic kidney disease: Secondary | ICD-10-CM | POA: Diagnosis not present

## 2023-02-07 DIAGNOSIS — M86171 Other acute osteomyelitis, right ankle and foot: Secondary | ICD-10-CM | POA: Diagnosis not present

## 2023-02-07 DIAGNOSIS — G8918 Other acute postprocedural pain: Secondary | ICD-10-CM | POA: Diagnosis not present

## 2023-02-07 DIAGNOSIS — J9600 Acute respiratory failure, unspecified whether with hypoxia or hypercapnia: Secondary | ICD-10-CM | POA: Diagnosis not present

## 2023-02-08 DIAGNOSIS — N186 End stage renal disease: Secondary | ICD-10-CM | POA: Diagnosis not present

## 2023-02-08 DIAGNOSIS — G8918 Other acute postprocedural pain: Secondary | ICD-10-CM | POA: Diagnosis not present

## 2023-02-08 DIAGNOSIS — I5030 Unspecified diastolic (congestive) heart failure: Secondary | ICD-10-CM | POA: Diagnosis not present

## 2023-02-08 DIAGNOSIS — T8611 Kidney transplant rejection: Secondary | ICD-10-CM | POA: Diagnosis not present

## 2023-02-08 DIAGNOSIS — L97411 Non-pressure chronic ulcer of right heel and midfoot limited to breakdown of skin: Secondary | ICD-10-CM | POA: Diagnosis not present

## 2023-02-08 DIAGNOSIS — E1169 Type 2 diabetes mellitus with other specified complication: Secondary | ICD-10-CM | POA: Diagnosis not present

## 2023-02-08 DIAGNOSIS — E119 Type 2 diabetes mellitus without complications: Secondary | ICD-10-CM | POA: Diagnosis not present

## 2023-02-08 DIAGNOSIS — I10 Essential (primary) hypertension: Secondary | ICD-10-CM | POA: Diagnosis not present

## 2023-02-08 DIAGNOSIS — I214 Non-ST elevation (NSTEMI) myocardial infarction: Secondary | ICD-10-CM | POA: Diagnosis not present

## 2023-02-08 DIAGNOSIS — M86171 Other acute osteomyelitis, right ankle and foot: Secondary | ICD-10-CM | POA: Diagnosis not present

## 2023-02-08 DIAGNOSIS — E11621 Type 2 diabetes mellitus with foot ulcer: Secondary | ICD-10-CM | POA: Diagnosis not present

## 2023-02-08 DIAGNOSIS — Z89511 Acquired absence of right leg below knee: Secondary | ICD-10-CM | POA: Diagnosis not present

## 2023-02-09 DIAGNOSIS — T82898A Other specified complication of vascular prosthetic devices, implants and grafts, initial encounter: Secondary | ICD-10-CM | POA: Diagnosis not present

## 2023-02-09 DIAGNOSIS — N186 End stage renal disease: Secondary | ICD-10-CM | POA: Diagnosis not present

## 2023-02-09 DIAGNOSIS — R2232 Localized swelling, mass and lump, left upper limb: Secondary | ICD-10-CM | POA: Diagnosis not present

## 2023-02-09 DIAGNOSIS — R600 Localized edema: Secondary | ICD-10-CM | POA: Diagnosis not present

## 2023-02-09 DIAGNOSIS — Z992 Dependence on renal dialysis: Secondary | ICD-10-CM | POA: Diagnosis not present

## 2023-02-09 DIAGNOSIS — M86171 Other acute osteomyelitis, right ankle and foot: Secondary | ICD-10-CM | POA: Diagnosis not present

## 2023-02-09 DIAGNOSIS — M7989 Other specified soft tissue disorders: Secondary | ICD-10-CM | POA: Diagnosis not present

## 2023-02-09 DIAGNOSIS — D631 Anemia in chronic kidney disease: Secondary | ICD-10-CM | POA: Diagnosis not present

## 2023-02-09 DIAGNOSIS — S88111A Complete traumatic amputation at level between knee and ankle, right lower leg, initial encounter: Secondary | ICD-10-CM | POA: Diagnosis not present

## 2023-02-09 DIAGNOSIS — I770 Arteriovenous fistula, acquired: Secondary | ICD-10-CM | POA: Diagnosis not present

## 2023-02-10 DIAGNOSIS — S88111A Complete traumatic amputation at level between knee and ankle, right lower leg, initial encounter: Secondary | ICD-10-CM | POA: Diagnosis not present

## 2023-02-10 DIAGNOSIS — R2232 Localized swelling, mass and lump, left upper limb: Secondary | ICD-10-CM | POA: Diagnosis not present

## 2023-02-10 DIAGNOSIS — M86171 Other acute osteomyelitis, right ankle and foot: Secondary | ICD-10-CM | POA: Diagnosis not present

## 2023-02-10 DIAGNOSIS — I770 Arteriovenous fistula, acquired: Secondary | ICD-10-CM | POA: Diagnosis not present

## 2023-02-11 DIAGNOSIS — S88111A Complete traumatic amputation at level between knee and ankle, right lower leg, initial encounter: Secondary | ICD-10-CM | POA: Diagnosis not present

## 2023-02-11 DIAGNOSIS — M869 Osteomyelitis, unspecified: Secondary | ICD-10-CM | POA: Diagnosis not present

## 2023-02-11 DIAGNOSIS — I132 Hypertensive heart and chronic kidney disease with heart failure and with stage 5 chronic kidney disease, or end stage renal disease: Secondary | ICD-10-CM | POA: Diagnosis not present

## 2023-02-11 DIAGNOSIS — M86171 Other acute osteomyelitis, right ankle and foot: Secondary | ICD-10-CM | POA: Diagnosis not present

## 2023-02-11 DIAGNOSIS — Z992 Dependence on renal dialysis: Secondary | ICD-10-CM | POA: Diagnosis not present

## 2023-02-11 DIAGNOSIS — I5030 Unspecified diastolic (congestive) heart failure: Secondary | ICD-10-CM | POA: Diagnosis not present

## 2023-02-11 DIAGNOSIS — T82856A Stenosis of peripheral vascular stent, initial encounter: Secondary | ICD-10-CM | POA: Diagnosis not present

## 2023-02-11 DIAGNOSIS — Z89511 Acquired absence of right leg below knee: Secondary | ICD-10-CM | POA: Diagnosis not present

## 2023-02-11 DIAGNOSIS — E440 Moderate protein-calorie malnutrition: Secondary | ICD-10-CM | POA: Diagnosis not present

## 2023-02-11 DIAGNOSIS — D509 Iron deficiency anemia, unspecified: Secondary | ICD-10-CM | POA: Diagnosis not present

## 2023-02-11 DIAGNOSIS — Z741 Need for assistance with personal care: Secondary | ICD-10-CM | POA: Diagnosis not present

## 2023-02-11 DIAGNOSIS — E1151 Type 2 diabetes mellitus with diabetic peripheral angiopathy without gangrene: Secondary | ICD-10-CM | POA: Diagnosis not present

## 2023-02-11 DIAGNOSIS — T82858A Stenosis of vascular prosthetic devices, implants and grafts, initial encounter: Secondary | ICD-10-CM | POA: Diagnosis not present

## 2023-02-11 DIAGNOSIS — I110 Hypertensive heart disease with heart failure: Secondary | ICD-10-CM | POA: Diagnosis not present

## 2023-02-11 DIAGNOSIS — Z6823 Body mass index (BMI) 23.0-23.9, adult: Secondary | ICD-10-CM | POA: Diagnosis not present

## 2023-02-11 DIAGNOSIS — M6250 Muscle wasting and atrophy, not elsewhere classified, unspecified site: Secondary | ICD-10-CM | POA: Diagnosis not present

## 2023-02-11 DIAGNOSIS — N186 End stage renal disease: Secondary | ICD-10-CM | POA: Diagnosis not present

## 2023-02-11 DIAGNOSIS — T8611 Kidney transplant rejection: Secondary | ICD-10-CM | POA: Diagnosis not present

## 2023-02-11 DIAGNOSIS — N189 Chronic kidney disease, unspecified: Secondary | ICD-10-CM | POA: Diagnosis not present

## 2023-02-11 DIAGNOSIS — K219 Gastro-esophageal reflux disease without esophagitis: Secondary | ICD-10-CM | POA: Diagnosis not present

## 2023-02-11 DIAGNOSIS — Z940 Kidney transplant status: Secondary | ICD-10-CM | POA: Diagnosis not present

## 2023-02-11 DIAGNOSIS — I5032 Chronic diastolic (congestive) heart failure: Secondary | ICD-10-CM | POA: Diagnosis not present

## 2023-02-11 DIAGNOSIS — Y830 Surgical operation with transplant of whole organ as the cause of abnormal reaction of the patient, or of later complication, without mention of misadventure at the time of the procedure: Secondary | ICD-10-CM | POA: Diagnosis not present

## 2023-02-11 DIAGNOSIS — E54 Ascorbic acid deficiency: Secondary | ICD-10-CM | POA: Diagnosis not present

## 2023-02-11 DIAGNOSIS — M6281 Muscle weakness (generalized): Secondary | ICD-10-CM | POA: Diagnosis not present

## 2023-02-11 DIAGNOSIS — R488 Other symbolic dysfunctions: Secondary | ICD-10-CM | POA: Diagnosis not present

## 2023-02-11 DIAGNOSIS — Z4781 Encounter for orthopedic aftercare following surgical amputation: Secondary | ICD-10-CM | POA: Diagnosis not present

## 2023-02-11 DIAGNOSIS — E7800 Pure hypercholesterolemia, unspecified: Secondary | ICD-10-CM | POA: Diagnosis not present

## 2023-02-11 DIAGNOSIS — E642 Sequelae of vitamin C deficiency: Secondary | ICD-10-CM | POA: Diagnosis not present

## 2023-02-11 DIAGNOSIS — I214 Non-ST elevation (NSTEMI) myocardial infarction: Secondary | ICD-10-CM | POA: Diagnosis not present

## 2023-02-11 DIAGNOSIS — R531 Weakness: Secondary | ICD-10-CM | POA: Diagnosis not present

## 2023-02-11 DIAGNOSIS — E42 Marasmic kwashiorkor: Secondary | ICD-10-CM | POA: Diagnosis not present

## 2023-02-11 DIAGNOSIS — E11621 Type 2 diabetes mellitus with foot ulcer: Secondary | ICD-10-CM | POA: Diagnosis not present

## 2023-02-11 DIAGNOSIS — E1169 Type 2 diabetes mellitus with other specified complication: Secondary | ICD-10-CM | POA: Diagnosis not present

## 2023-02-11 DIAGNOSIS — G629 Polyneuropathy, unspecified: Secondary | ICD-10-CM | POA: Diagnosis not present

## 2023-02-11 DIAGNOSIS — I120 Hypertensive chronic kidney disease with stage 5 chronic kidney disease or end stage renal disease: Secondary | ICD-10-CM | POA: Diagnosis not present

## 2023-02-11 DIAGNOSIS — M8610 Other acute osteomyelitis, unspecified site: Secondary | ICD-10-CM | POA: Diagnosis not present

## 2023-02-11 DIAGNOSIS — N400 Enlarged prostate without lower urinary tract symptoms: Secondary | ICD-10-CM | POA: Diagnosis not present

## 2023-02-11 DIAGNOSIS — I10 Essential (primary) hypertension: Secondary | ICD-10-CM | POA: Diagnosis not present

## 2023-02-11 DIAGNOSIS — D631 Anemia in chronic kidney disease: Secondary | ICD-10-CM | POA: Diagnosis not present

## 2023-02-12 ENCOUNTER — Ambulatory Visit
Admission: TF | Admit: 2023-02-12 | Discharge: 2023-02-25 | Disposition: A | Discharge status: Skilled Nursing Facility | Source: Intra-hospital | Admitting: Physical Medicine & Rehabilitation

## 2023-02-12 ENCOUNTER — Ambulatory Visit: Admission: TF | Admit: 2023-02-12 | Source: Intra-hospital | Admitting: Physical Medicine & Rehabilitation

## 2023-02-12 ENCOUNTER — Ambulatory Visit
Admission: TF | Admit: 2023-02-12 | Discharge: 2023-02-25 | Source: Intra-hospital | Admitting: Physical Medicine & Rehabilitation

## 2023-02-14 DIAGNOSIS — N400 Enlarged prostate without lower urinary tract symptoms: Principal | ICD-10-CM

## 2023-02-14 DIAGNOSIS — E1151 Type 2 diabetes mellitus with diabetic peripheral angiopathy without gangrene: Principal | ICD-10-CM

## 2023-02-14 DIAGNOSIS — K219 Gastro-esophageal reflux disease without esophagitis: Principal | ICD-10-CM

## 2023-02-14 DIAGNOSIS — I110 Hypertensive heart disease with heart failure: Principal | ICD-10-CM

## 2023-02-14 DIAGNOSIS — D509 Iron deficiency anemia, unspecified: Principal | ICD-10-CM

## 2023-02-14 DIAGNOSIS — T8611 Kidney transplant rejection: Principal | ICD-10-CM

## 2023-02-14 DIAGNOSIS — E54 Ascorbic acid deficiency: Principal | ICD-10-CM

## 2023-02-14 DIAGNOSIS — E1169 Type 2 diabetes mellitus with other specified complication: Principal | ICD-10-CM

## 2023-02-14 DIAGNOSIS — Z992 Dependence on renal dialysis: Principal | ICD-10-CM

## 2023-02-14 DIAGNOSIS — E440 Moderate protein-calorie malnutrition: Principal | ICD-10-CM

## 2023-02-14 DIAGNOSIS — E7800 Pure hypercholesterolemia, unspecified: Principal | ICD-10-CM

## 2023-02-14 DIAGNOSIS — Z6823 Body mass index (BMI) 23.0-23.9, adult: Principal | ICD-10-CM

## 2023-02-14 DIAGNOSIS — M869 Osteomyelitis, unspecified: Principal | ICD-10-CM

## 2023-02-14 DIAGNOSIS — I5032 Chronic diastolic (congestive) heart failure: Principal | ICD-10-CM

## 2023-02-14 DIAGNOSIS — I214 Non-ST elevation (NSTEMI) myocardial infarction: Principal | ICD-10-CM

## 2023-02-16 DIAGNOSIS — I110 Hypertensive heart disease with heart failure: Principal | ICD-10-CM

## 2023-02-16 DIAGNOSIS — N400 Enlarged prostate without lower urinary tract symptoms: Principal | ICD-10-CM

## 2023-02-16 DIAGNOSIS — Z992 Dependence on renal dialysis: Principal | ICD-10-CM

## 2023-02-16 DIAGNOSIS — M869 Osteomyelitis, unspecified: Principal | ICD-10-CM

## 2023-02-16 DIAGNOSIS — E1169 Type 2 diabetes mellitus with other specified complication: Principal | ICD-10-CM

## 2023-02-16 DIAGNOSIS — Z6823 Body mass index (BMI) 23.0-23.9, adult: Principal | ICD-10-CM

## 2023-02-16 DIAGNOSIS — K219 Gastro-esophageal reflux disease without esophagitis: Principal | ICD-10-CM

## 2023-02-16 DIAGNOSIS — T8611 Kidney transplant rejection: Principal | ICD-10-CM

## 2023-02-16 DIAGNOSIS — D509 Iron deficiency anemia, unspecified: Principal | ICD-10-CM

## 2023-02-16 DIAGNOSIS — E54 Ascorbic acid deficiency: Principal | ICD-10-CM

## 2023-02-16 DIAGNOSIS — E1151 Type 2 diabetes mellitus with diabetic peripheral angiopathy without gangrene: Principal | ICD-10-CM

## 2023-02-16 DIAGNOSIS — I214 Non-ST elevation (NSTEMI) myocardial infarction: Principal | ICD-10-CM

## 2023-02-16 DIAGNOSIS — E440 Moderate protein-calorie malnutrition: Principal | ICD-10-CM

## 2023-02-16 DIAGNOSIS — E7800 Pure hypercholesterolemia, unspecified: Principal | ICD-10-CM

## 2023-02-16 DIAGNOSIS — I5032 Chronic diastolic (congestive) heart failure: Principal | ICD-10-CM

## 2023-02-18 DIAGNOSIS — E7800 Pure hypercholesterolemia, unspecified: Principal | ICD-10-CM

## 2023-02-18 DIAGNOSIS — E440 Moderate protein-calorie malnutrition: Principal | ICD-10-CM

## 2023-02-18 DIAGNOSIS — T8611 Kidney transplant rejection: Principal | ICD-10-CM

## 2023-02-18 DIAGNOSIS — E54 Ascorbic acid deficiency: Principal | ICD-10-CM

## 2023-02-18 DIAGNOSIS — N400 Enlarged prostate without lower urinary tract symptoms: Principal | ICD-10-CM

## 2023-02-18 DIAGNOSIS — E1169 Type 2 diabetes mellitus with other specified complication: Principal | ICD-10-CM

## 2023-02-18 DIAGNOSIS — E1151 Type 2 diabetes mellitus with diabetic peripheral angiopathy without gangrene: Principal | ICD-10-CM

## 2023-02-18 DIAGNOSIS — I214 Non-ST elevation (NSTEMI) myocardial infarction: Principal | ICD-10-CM

## 2023-02-18 DIAGNOSIS — D509 Iron deficiency anemia, unspecified: Principal | ICD-10-CM

## 2023-02-18 DIAGNOSIS — Z992 Dependence on renal dialysis: Principal | ICD-10-CM

## 2023-02-18 DIAGNOSIS — I5032 Chronic diastolic (congestive) heart failure: Principal | ICD-10-CM

## 2023-02-18 DIAGNOSIS — Z6823 Body mass index (BMI) 23.0-23.9, adult: Principal | ICD-10-CM

## 2023-02-18 DIAGNOSIS — M869 Osteomyelitis, unspecified: Principal | ICD-10-CM

## 2023-02-18 DIAGNOSIS — I110 Hypertensive heart disease with heart failure: Principal | ICD-10-CM

## 2023-02-18 DIAGNOSIS — K219 Gastro-esophageal reflux disease without esophagitis: Principal | ICD-10-CM

## 2023-02-21 DIAGNOSIS — M869 Osteomyelitis, unspecified: Principal | ICD-10-CM

## 2023-02-21 DIAGNOSIS — D509 Iron deficiency anemia, unspecified: Principal | ICD-10-CM

## 2023-02-21 DIAGNOSIS — I5032 Chronic diastolic (congestive) heart failure: Principal | ICD-10-CM

## 2023-02-21 DIAGNOSIS — I110 Hypertensive heart disease with heart failure: Principal | ICD-10-CM

## 2023-02-21 DIAGNOSIS — N400 Enlarged prostate without lower urinary tract symptoms: Principal | ICD-10-CM

## 2023-02-21 DIAGNOSIS — E1151 Type 2 diabetes mellitus with diabetic peripheral angiopathy without gangrene: Principal | ICD-10-CM

## 2023-02-21 DIAGNOSIS — E440 Moderate protein-calorie malnutrition: Principal | ICD-10-CM

## 2023-02-21 DIAGNOSIS — Z6823 Body mass index (BMI) 23.0-23.9, adult: Principal | ICD-10-CM

## 2023-02-21 DIAGNOSIS — Z992 Dependence on renal dialysis: Principal | ICD-10-CM

## 2023-02-21 DIAGNOSIS — K219 Gastro-esophageal reflux disease without esophagitis: Principal | ICD-10-CM

## 2023-02-21 DIAGNOSIS — E1169 Type 2 diabetes mellitus with other specified complication: Principal | ICD-10-CM

## 2023-02-21 DIAGNOSIS — E54 Ascorbic acid deficiency: Principal | ICD-10-CM

## 2023-02-21 DIAGNOSIS — T8611 Kidney transplant rejection: Principal | ICD-10-CM

## 2023-02-21 DIAGNOSIS — I214 Non-ST elevation (NSTEMI) myocardial infarction: Principal | ICD-10-CM

## 2023-02-21 DIAGNOSIS — E7800 Pure hypercholesterolemia, unspecified: Principal | ICD-10-CM

## 2023-02-23 DIAGNOSIS — M869 Osteomyelitis, unspecified: Principal | ICD-10-CM

## 2023-02-23 DIAGNOSIS — E1169 Type 2 diabetes mellitus with other specified complication: Principal | ICD-10-CM

## 2023-02-23 DIAGNOSIS — Z992 Dependence on renal dialysis: Principal | ICD-10-CM

## 2023-02-23 DIAGNOSIS — D509 Iron deficiency anemia, unspecified: Principal | ICD-10-CM

## 2023-02-23 DIAGNOSIS — Z6823 Body mass index (BMI) 23.0-23.9, adult: Principal | ICD-10-CM

## 2023-02-23 DIAGNOSIS — E7800 Pure hypercholesterolemia, unspecified: Principal | ICD-10-CM

## 2023-02-23 DIAGNOSIS — K219 Gastro-esophageal reflux disease without esophagitis: Principal | ICD-10-CM

## 2023-02-23 DIAGNOSIS — I110 Hypertensive heart disease with heart failure: Principal | ICD-10-CM

## 2023-02-23 DIAGNOSIS — I214 Non-ST elevation (NSTEMI) myocardial infarction: Principal | ICD-10-CM

## 2023-02-23 DIAGNOSIS — T8611 Kidney transplant rejection: Principal | ICD-10-CM

## 2023-02-23 DIAGNOSIS — E54 Ascorbic acid deficiency: Principal | ICD-10-CM

## 2023-02-23 DIAGNOSIS — I5032 Chronic diastolic (congestive) heart failure: Principal | ICD-10-CM

## 2023-02-23 DIAGNOSIS — E1151 Type 2 diabetes mellitus with diabetic peripheral angiopathy without gangrene: Principal | ICD-10-CM

## 2023-02-23 DIAGNOSIS — N400 Enlarged prostate without lower urinary tract symptoms: Principal | ICD-10-CM

## 2023-02-23 DIAGNOSIS — E440 Moderate protein-calorie malnutrition: Principal | ICD-10-CM

## 2023-02-25 DIAGNOSIS — N186 End stage renal disease: Secondary | ICD-10-CM | POA: Diagnosis not present

## 2023-02-25 DIAGNOSIS — B351 Tinea unguium: Secondary | ICD-10-CM | POA: Diagnosis not present

## 2023-02-25 DIAGNOSIS — M8610 Other acute osteomyelitis, unspecified site: Secondary | ICD-10-CM | POA: Diagnosis not present

## 2023-02-25 DIAGNOSIS — T8611 Kidney transplant rejection: Secondary | ICD-10-CM | POA: Diagnosis not present

## 2023-02-25 DIAGNOSIS — I214 Non-ST elevation (NSTEMI) myocardial infarction: Secondary | ICD-10-CM | POA: Diagnosis not present

## 2023-02-25 DIAGNOSIS — I739 Peripheral vascular disease, unspecified: Secondary | ICD-10-CM | POA: Diagnosis not present

## 2023-02-25 DIAGNOSIS — D631 Anemia in chronic kidney disease: Secondary | ICD-10-CM | POA: Diagnosis not present

## 2023-02-25 DIAGNOSIS — M86171 Other acute osteomyelitis, right ankle and foot: Secondary | ICD-10-CM | POA: Diagnosis not present

## 2023-02-25 DIAGNOSIS — E1139 Type 2 diabetes mellitus with other diabetic ophthalmic complication: Secondary | ICD-10-CM | POA: Diagnosis not present

## 2023-02-25 DIAGNOSIS — M6281 Muscle weakness (generalized): Secondary | ICD-10-CM | POA: Diagnosis not present

## 2023-02-25 DIAGNOSIS — M549 Dorsalgia, unspecified: Secondary | ICD-10-CM | POA: Diagnosis not present

## 2023-02-25 DIAGNOSIS — E1129 Type 2 diabetes mellitus with other diabetic kidney complication: Secondary | ICD-10-CM | POA: Diagnosis not present

## 2023-02-25 DIAGNOSIS — E11319 Type 2 diabetes mellitus with unspecified diabetic retinopathy without macular edema: Secondary | ICD-10-CM | POA: Diagnosis not present

## 2023-02-25 DIAGNOSIS — Z992 Dependence on renal dialysis: Secondary | ICD-10-CM | POA: Diagnosis not present

## 2023-02-25 DIAGNOSIS — Z89519 Acquired absence of unspecified leg below knee: Secondary | ICD-10-CM | POA: Diagnosis not present

## 2023-02-25 DIAGNOSIS — G629 Polyneuropathy, unspecified: Secondary | ICD-10-CM | POA: Diagnosis not present

## 2023-02-25 DIAGNOSIS — I5030 Unspecified diastolic (congestive) heart failure: Secondary | ICD-10-CM | POA: Diagnosis not present

## 2023-02-25 DIAGNOSIS — R488 Other symbolic dysfunctions: Secondary | ICD-10-CM | POA: Diagnosis not present

## 2023-02-25 DIAGNOSIS — Z794 Long term (current) use of insulin: Secondary | ICD-10-CM | POA: Diagnosis not present

## 2023-02-25 DIAGNOSIS — E119 Type 2 diabetes mellitus without complications: Secondary | ICD-10-CM | POA: Diagnosis not present

## 2023-02-25 DIAGNOSIS — N189 Chronic kidney disease, unspecified: Secondary | ICD-10-CM | POA: Diagnosis not present

## 2023-02-25 DIAGNOSIS — K219 Gastro-esophageal reflux disease without esophagitis: Secondary | ICD-10-CM | POA: Diagnosis not present

## 2023-02-25 DIAGNOSIS — Z940 Kidney transplant status: Secondary | ICD-10-CM | POA: Diagnosis not present

## 2023-02-25 DIAGNOSIS — N184 Chronic kidney disease, stage 4 (severe): Secondary | ICD-10-CM | POA: Diagnosis not present

## 2023-02-25 DIAGNOSIS — I10 Essential (primary) hypertension: Secondary | ICD-10-CM | POA: Diagnosis not present

## 2023-02-25 DIAGNOSIS — S81001A Unspecified open wound, right knee, initial encounter: Secondary | ICD-10-CM | POA: Diagnosis not present

## 2023-02-25 DIAGNOSIS — E642 Sequelae of vitamin C deficiency: Secondary | ICD-10-CM | POA: Diagnosis not present

## 2023-02-25 DIAGNOSIS — Z89511 Acquired absence of right leg below knee: Secondary | ICD-10-CM | POA: Diagnosis not present

## 2023-02-25 DIAGNOSIS — E42 Marasmic kwashiorkor: Secondary | ICD-10-CM | POA: Diagnosis not present

## 2023-02-25 DIAGNOSIS — E785 Hyperlipidemia, unspecified: Secondary | ICD-10-CM | POA: Diagnosis not present

## 2023-02-25 DIAGNOSIS — N400 Enlarged prostate without lower urinary tract symptoms: Secondary | ICD-10-CM | POA: Diagnosis not present

## 2023-02-25 DIAGNOSIS — M869 Osteomyelitis, unspecified: Secondary | ICD-10-CM | POA: Diagnosis not present

## 2023-02-25 DIAGNOSIS — S88111D Complete traumatic amputation at level between knee and ankle, right lower leg, subsequent encounter: Secondary | ICD-10-CM | POA: Diagnosis not present

## 2023-02-25 DIAGNOSIS — X58XXXD Exposure to other specified factors, subsequent encounter: Secondary | ICD-10-CM | POA: Diagnosis not present

## 2023-02-25 DIAGNOSIS — E440 Moderate protein-calorie malnutrition: Secondary | ICD-10-CM | POA: Diagnosis not present

## 2023-02-25 DIAGNOSIS — F4323 Adjustment disorder with mixed anxiety and depressed mood: Secondary | ICD-10-CM | POA: Diagnosis not present

## 2023-02-25 DIAGNOSIS — E7800 Pure hypercholesterolemia, unspecified: Secondary | ICD-10-CM | POA: Diagnosis not present

## 2023-02-25 DIAGNOSIS — R52 Pain, unspecified: Secondary | ICD-10-CM | POA: Diagnosis not present

## 2023-02-25 DIAGNOSIS — Z741 Need for assistance with personal care: Secondary | ICD-10-CM | POA: Diagnosis not present

## 2023-02-25 DIAGNOSIS — I132 Hypertensive heart and chronic kidney disease with heart failure and with stage 5 chronic kidney disease, or end stage renal disease: Secondary | ICD-10-CM | POA: Diagnosis not present

## 2023-02-25 DIAGNOSIS — I110 Hypertensive heart disease with heart failure: Principal | ICD-10-CM

## 2023-02-25 DIAGNOSIS — E1169 Type 2 diabetes mellitus with other specified complication: Principal | ICD-10-CM

## 2023-02-25 DIAGNOSIS — E1151 Type 2 diabetes mellitus with diabetic peripheral angiopathy without gangrene: Principal | ICD-10-CM

## 2023-02-25 DIAGNOSIS — I5032 Chronic diastolic (congestive) heart failure: Principal | ICD-10-CM

## 2023-02-25 DIAGNOSIS — E54 Ascorbic acid deficiency: Principal | ICD-10-CM

## 2023-02-25 DIAGNOSIS — Z6823 Body mass index (BMI) 23.0-23.9, adult: Principal | ICD-10-CM

## 2023-02-25 DIAGNOSIS — D509 Iron deficiency anemia, unspecified: Principal | ICD-10-CM

## 2023-02-28 DIAGNOSIS — T8611 Kidney transplant rejection: Secondary | ICD-10-CM | POA: Diagnosis not present

## 2023-02-28 DIAGNOSIS — Z89511 Acquired absence of right leg below knee: Secondary | ICD-10-CM | POA: Diagnosis not present

## 2023-02-28 DIAGNOSIS — Z992 Dependence on renal dialysis: Secondary | ICD-10-CM | POA: Diagnosis not present

## 2023-02-28 DIAGNOSIS — N186 End stage renal disease: Secondary | ICD-10-CM | POA: Diagnosis not present

## 2023-02-28 DIAGNOSIS — Z794 Long term (current) use of insulin: Secondary | ICD-10-CM | POA: Diagnosis not present

## 2023-02-28 DIAGNOSIS — E1139 Type 2 diabetes mellitus with other diabetic ophthalmic complication: Secondary | ICD-10-CM | POA: Diagnosis not present

## 2023-02-28 DIAGNOSIS — I10 Essential (primary) hypertension: Secondary | ICD-10-CM | POA: Diagnosis not present

## 2023-02-28 DIAGNOSIS — G629 Polyneuropathy, unspecified: Secondary | ICD-10-CM | POA: Diagnosis not present

## 2023-03-01 DIAGNOSIS — M549 Dorsalgia, unspecified: Secondary | ICD-10-CM | POA: Diagnosis not present

## 2023-03-01 DIAGNOSIS — R52 Pain, unspecified: Secondary | ICD-10-CM | POA: Diagnosis not present

## 2023-03-03 DIAGNOSIS — E1129 Type 2 diabetes mellitus with other diabetic kidney complication: Secondary | ICD-10-CM | POA: Diagnosis not present

## 2023-03-03 DIAGNOSIS — Z992 Dependence on renal dialysis: Secondary | ICD-10-CM | POA: Diagnosis not present

## 2023-03-03 DIAGNOSIS — N186 End stage renal disease: Secondary | ICD-10-CM | POA: Diagnosis not present

## 2023-03-06 DIAGNOSIS — T8611 Kidney transplant rejection: Principal | ICD-10-CM

## 2023-03-06 DIAGNOSIS — Z48298 Encounter for aftercare following other organ transplant: Principal | ICD-10-CM

## 2023-03-08 ENCOUNTER — Ambulatory Visit
Admit: 2023-03-08 | Discharge: 2023-03-09 | Attending: Student in an Organized Health Care Education/Training Program | Primary: Student in an Organized Health Care Education/Training Program

## 2023-03-08 DIAGNOSIS — E119 Type 2 diabetes mellitus without complications: Secondary | ICD-10-CM | POA: Diagnosis not present

## 2023-03-08 DIAGNOSIS — Z89511 Acquired absence of right leg below knee: Secondary | ICD-10-CM | POA: Diagnosis not present

## 2023-03-08 DIAGNOSIS — S88111D Complete traumatic amputation at level between knee and ankle, right lower leg, subsequent encounter: Secondary | ICD-10-CM | POA: Diagnosis not present

## 2023-03-08 DIAGNOSIS — E7800 Pure hypercholesterolemia, unspecified: Secondary | ICD-10-CM | POA: Diagnosis not present

## 2023-03-08 DIAGNOSIS — K219 Gastro-esophageal reflux disease without esophagitis: Secondary | ICD-10-CM | POA: Diagnosis not present

## 2023-03-08 DIAGNOSIS — X58XXXD Exposure to other specified factors, subsequent encounter: Secondary | ICD-10-CM | POA: Diagnosis not present

## 2023-03-08 DIAGNOSIS — I10 Essential (primary) hypertension: Secondary | ICD-10-CM | POA: Diagnosis not present

## 2023-03-08 DIAGNOSIS — R52 Pain, unspecified: Secondary | ICD-10-CM | POA: Diagnosis not present

## 2023-03-08 DIAGNOSIS — Z89519 Acquired absence of unspecified leg below knee: Secondary | ICD-10-CM | POA: Diagnosis not present

## 2023-03-10 DIAGNOSIS — Z940 Kidney transplant status: Principal | ICD-10-CM

## 2023-03-10 DIAGNOSIS — I10 Essential (primary) hypertension: Principal | ICD-10-CM

## 2023-03-10 MED ORDER — HYDROCHLOROTHIAZIDE 50 MG TABLET
ORAL_TABLET | Freq: Every day | ORAL | 3 refills | 90 days | Status: CP
Start: 2023-03-10 — End: ?

## 2023-03-15 DIAGNOSIS — Z89511 Acquired absence of right leg below knee: Secondary | ICD-10-CM | POA: Diagnosis not present

## 2023-03-15 DIAGNOSIS — E785 Hyperlipidemia, unspecified: Secondary | ICD-10-CM | POA: Diagnosis not present

## 2023-03-15 DIAGNOSIS — T8611 Kidney transplant rejection: Secondary | ICD-10-CM | POA: Diagnosis not present

## 2023-03-15 DIAGNOSIS — M549 Dorsalgia, unspecified: Secondary | ICD-10-CM | POA: Diagnosis not present

## 2023-03-15 DIAGNOSIS — I10 Essential (primary) hypertension: Secondary | ICD-10-CM | POA: Diagnosis not present

## 2023-03-15 DIAGNOSIS — Z992 Dependence on renal dialysis: Secondary | ICD-10-CM | POA: Diagnosis not present

## 2023-03-17 ENCOUNTER — Ambulatory Visit
Admit: 2023-03-17 | Discharge: 2023-03-18 | Attending: Student in an Organized Health Care Education/Training Program | Primary: Student in an Organized Health Care Education/Training Program

## 2023-03-17 DIAGNOSIS — K219 Gastro-esophageal reflux disease without esophagitis: Secondary | ICD-10-CM | POA: Diagnosis not present

## 2023-03-17 DIAGNOSIS — N184 Chronic kidney disease, stage 4 (severe): Secondary | ICD-10-CM | POA: Diagnosis not present

## 2023-03-17 DIAGNOSIS — S81001A Unspecified open wound, right knee, initial encounter: Secondary | ICD-10-CM | POA: Diagnosis not present

## 2023-03-17 DIAGNOSIS — B351 Tinea unguium: Secondary | ICD-10-CM | POA: Diagnosis not present

## 2023-03-17 DIAGNOSIS — I10 Essential (primary) hypertension: Secondary | ICD-10-CM | POA: Diagnosis not present

## 2023-03-17 DIAGNOSIS — I5030 Unspecified diastolic (congestive) heart failure: Secondary | ICD-10-CM | POA: Diagnosis not present

## 2023-03-17 DIAGNOSIS — Z89511 Acquired absence of right leg below knee: Secondary | ICD-10-CM | POA: Diagnosis not present

## 2023-03-17 DIAGNOSIS — E7800 Pure hypercholesterolemia, unspecified: Secondary | ICD-10-CM | POA: Diagnosis not present

## 2023-03-17 DIAGNOSIS — E11319 Type 2 diabetes mellitus with unspecified diabetic retinopathy without macular edema: Secondary | ICD-10-CM | POA: Diagnosis not present

## 2023-03-17 DIAGNOSIS — Z940 Kidney transplant status: Secondary | ICD-10-CM | POA: Diagnosis not present

## 2023-03-17 DIAGNOSIS — T8611 Kidney transplant rejection: Secondary | ICD-10-CM | POA: Diagnosis not present

## 2023-03-17 DIAGNOSIS — I739 Peripheral vascular disease, unspecified: Secondary | ICD-10-CM | POA: Diagnosis not present

## 2023-03-17 DIAGNOSIS — N400 Enlarged prostate without lower urinary tract symptoms: Principal | ICD-10-CM

## 2023-03-18 ENCOUNTER — Other Ambulatory Visit (HOSPITAL_BASED_OUTPATIENT_CLINIC_OR_DEPARTMENT_OTHER): Payer: Self-pay | Admitting: Nephrology

## 2023-03-18 DIAGNOSIS — R0602 Shortness of breath: Secondary | ICD-10-CM

## 2023-03-22 ENCOUNTER — Emergency Department
Admission: EM | Admit: 2023-03-22 | Discharge: 2023-03-22 | Discharge status: Against Medical Advice | Payer: Medicare HMO | Attending: Emergency Medicine | Admitting: Emergency Medicine

## 2023-03-22 ENCOUNTER — Other Ambulatory Visit: Payer: Self-pay

## 2023-03-22 DIAGNOSIS — Z992 Dependence on renal dialysis: Secondary | ICD-10-CM | POA: Insufficient documentation

## 2023-03-22 DIAGNOSIS — Z5321 Procedure and treatment not carried out due to patient leaving prior to being seen by health care provider: Secondary | ICD-10-CM | POA: Diagnosis not present

## 2023-03-22 NOTE — ED Notes (Signed)
Patient states Blake Torres called and he has an appointment for tomorrow morning at 1000.  Does not ned dialysis today.

## 2023-03-22 NOTE — ED Triage Notes (Signed)
Arrives for regular dialysis.  States patient was just discharged from the hospital on Friday and was told to come through the ED for Dialysis until outpatient dialysis can be set up.  Patient is without complaints.

## 2023-03-23 DIAGNOSIS — E1122 Type 2 diabetes mellitus with diabetic chronic kidney disease: Secondary | ICD-10-CM | POA: Diagnosis not present

## 2023-03-23 DIAGNOSIS — Z992 Dependence on renal dialysis: Secondary | ICD-10-CM | POA: Diagnosis not present

## 2023-03-23 DIAGNOSIS — N186 End stage renal disease: Secondary | ICD-10-CM | POA: Diagnosis not present

## 2023-03-23 DIAGNOSIS — Z1159 Encounter for screening for other viral diseases: Secondary | ICD-10-CM | POA: Diagnosis not present

## 2023-03-24 DIAGNOSIS — Z89511 Acquired absence of right leg below knee: Secondary | ICD-10-CM | POA: Diagnosis not present

## 2023-03-24 DIAGNOSIS — I120 Hypertensive chronic kidney disease with stage 5 chronic kidney disease or end stage renal disease: Secondary | ICD-10-CM | POA: Diagnosis not present

## 2023-03-24 DIAGNOSIS — Z4781 Encounter for orthopedic aftercare following surgical amputation: Secondary | ICD-10-CM | POA: Diagnosis not present

## 2023-03-24 DIAGNOSIS — M549 Dorsalgia, unspecified: Secondary | ICD-10-CM | POA: Diagnosis not present

## 2023-03-24 DIAGNOSIS — K219 Gastro-esophageal reflux disease without esophagitis: Secondary | ICD-10-CM | POA: Diagnosis not present

## 2023-03-24 DIAGNOSIS — Z4801 Encounter for change or removal of surgical wound dressing: Secondary | ICD-10-CM | POA: Diagnosis not present

## 2023-03-24 DIAGNOSIS — N186 End stage renal disease: Secondary | ICD-10-CM | POA: Diagnosis not present

## 2023-03-24 DIAGNOSIS — Z940 Kidney transplant status: Secondary | ICD-10-CM | POA: Diagnosis not present

## 2023-03-24 DIAGNOSIS — E785 Hyperlipidemia, unspecified: Secondary | ICD-10-CM | POA: Diagnosis not present

## 2023-03-24 DIAGNOSIS — E1122 Type 2 diabetes mellitus with diabetic chronic kidney disease: Secondary | ICD-10-CM | POA: Diagnosis not present

## 2023-03-24 DIAGNOSIS — Z992 Dependence on renal dialysis: Secondary | ICD-10-CM | POA: Diagnosis not present

## 2023-03-24 DIAGNOSIS — Z7902 Long term (current) use of antithrombotics/antiplatelets: Secondary | ICD-10-CM | POA: Diagnosis not present

## 2023-03-25 DIAGNOSIS — N186 End stage renal disease: Secondary | ICD-10-CM | POA: Diagnosis not present

## 2023-03-25 DIAGNOSIS — Z992 Dependence on renal dialysis: Secondary | ICD-10-CM | POA: Diagnosis not present

## 2023-03-28 DIAGNOSIS — N186 End stage renal disease: Secondary | ICD-10-CM | POA: Diagnosis not present

## 2023-03-28 DIAGNOSIS — Z992 Dependence on renal dialysis: Secondary | ICD-10-CM | POA: Diagnosis not present

## 2023-03-29 DIAGNOSIS — I120 Hypertensive chronic kidney disease with stage 5 chronic kidney disease or end stage renal disease: Secondary | ICD-10-CM | POA: Diagnosis not present

## 2023-03-29 DIAGNOSIS — Z940 Kidney transplant status: Secondary | ICD-10-CM | POA: Diagnosis not present

## 2023-03-29 DIAGNOSIS — Z4781 Encounter for orthopedic aftercare following surgical amputation: Secondary | ICD-10-CM | POA: Diagnosis not present

## 2023-03-29 DIAGNOSIS — E1122 Type 2 diabetes mellitus with diabetic chronic kidney disease: Secondary | ICD-10-CM | POA: Diagnosis not present

## 2023-03-29 DIAGNOSIS — K219 Gastro-esophageal reflux disease without esophagitis: Secondary | ICD-10-CM | POA: Diagnosis not present

## 2023-03-29 DIAGNOSIS — Z992 Dependence on renal dialysis: Secondary | ICD-10-CM | POA: Diagnosis not present

## 2023-03-29 DIAGNOSIS — E785 Hyperlipidemia, unspecified: Secondary | ICD-10-CM | POA: Diagnosis not present

## 2023-03-29 DIAGNOSIS — Z4801 Encounter for change or removal of surgical wound dressing: Secondary | ICD-10-CM | POA: Diagnosis not present

## 2023-03-29 DIAGNOSIS — Z7902 Long term (current) use of antithrombotics/antiplatelets: Secondary | ICD-10-CM | POA: Diagnosis not present

## 2023-03-29 DIAGNOSIS — Z89511 Acquired absence of right leg below knee: Secondary | ICD-10-CM | POA: Diagnosis not present

## 2023-03-29 DIAGNOSIS — N186 End stage renal disease: Secondary | ICD-10-CM | POA: Diagnosis not present

## 2023-03-29 DIAGNOSIS — M549 Dorsalgia, unspecified: Secondary | ICD-10-CM | POA: Diagnosis not present

## 2023-03-30 DIAGNOSIS — Z992 Dependence on renal dialysis: Secondary | ICD-10-CM | POA: Diagnosis not present

## 2023-03-30 DIAGNOSIS — N186 End stage renal disease: Secondary | ICD-10-CM | POA: Diagnosis not present

## 2023-04-01 DIAGNOSIS — Z992 Dependence on renal dialysis: Secondary | ICD-10-CM | POA: Diagnosis not present

## 2023-04-01 DIAGNOSIS — N186 End stage renal disease: Secondary | ICD-10-CM | POA: Diagnosis not present

## 2023-04-02 DIAGNOSIS — E1122 Type 2 diabetes mellitus with diabetic chronic kidney disease: Secondary | ICD-10-CM | POA: Diagnosis not present

## 2023-04-02 DIAGNOSIS — N186 End stage renal disease: Secondary | ICD-10-CM | POA: Diagnosis not present

## 2023-04-02 DIAGNOSIS — Z992 Dependence on renal dialysis: Secondary | ICD-10-CM | POA: Diagnosis not present

## 2023-04-03 DIAGNOSIS — Z48298 Encounter for aftercare following other organ transplant: Principal | ICD-10-CM

## 2023-04-03 DIAGNOSIS — T8611 Kidney transplant rejection: Principal | ICD-10-CM

## 2023-04-04 DIAGNOSIS — Z992 Dependence on renal dialysis: Secondary | ICD-10-CM | POA: Diagnosis not present

## 2023-04-04 DIAGNOSIS — L97529 Non-pressure chronic ulcer of other part of left foot with unspecified severity: Secondary | ICD-10-CM | POA: Diagnosis not present

## 2023-04-04 DIAGNOSIS — N186 End stage renal disease: Secondary | ICD-10-CM | POA: Diagnosis not present

## 2023-04-04 DIAGNOSIS — T8611 Kidney transplant rejection: Principal | ICD-10-CM

## 2023-04-04 MED ORDER — EZETIMIBE 10 MG TABLET
ORAL_TABLET | Freq: Every evening | ORAL | 11 refills | 30 days | Status: CP
Start: 2023-04-04 — End: 2024-04-03

## 2023-04-04 MED ORDER — PANTOPRAZOLE 40 MG TABLET,DELAYED RELEASE
ORAL_TABLET | Freq: Every day | ORAL | 11 refills | 30 days | Status: CP
Start: 2023-04-04 — End: 2024-04-03

## 2023-04-04 MED ORDER — CLOPIDOGREL 75 MG TABLET
ORAL_TABLET | Freq: Every day | ORAL | 11 refills | 30 days | Status: CP
Start: 2023-04-04 — End: 2024-04-03

## 2023-04-05 DIAGNOSIS — Z4781 Encounter for orthopedic aftercare following surgical amputation: Secondary | ICD-10-CM | POA: Diagnosis not present

## 2023-04-05 DIAGNOSIS — Z4801 Encounter for change or removal of surgical wound dressing: Secondary | ICD-10-CM | POA: Diagnosis not present

## 2023-04-05 DIAGNOSIS — M549 Dorsalgia, unspecified: Secondary | ICD-10-CM | POA: Diagnosis not present

## 2023-04-05 DIAGNOSIS — Z940 Kidney transplant status: Secondary | ICD-10-CM | POA: Diagnosis not present

## 2023-04-05 DIAGNOSIS — K219 Gastro-esophageal reflux disease without esophagitis: Secondary | ICD-10-CM | POA: Diagnosis not present

## 2023-04-05 DIAGNOSIS — E1122 Type 2 diabetes mellitus with diabetic chronic kidney disease: Secondary | ICD-10-CM | POA: Diagnosis not present

## 2023-04-05 DIAGNOSIS — Z992 Dependence on renal dialysis: Secondary | ICD-10-CM | POA: Diagnosis not present

## 2023-04-05 DIAGNOSIS — N186 End stage renal disease: Secondary | ICD-10-CM | POA: Diagnosis not present

## 2023-04-05 DIAGNOSIS — I120 Hypertensive chronic kidney disease with stage 5 chronic kidney disease or end stage renal disease: Secondary | ICD-10-CM | POA: Diagnosis not present

## 2023-04-05 DIAGNOSIS — E785 Hyperlipidemia, unspecified: Secondary | ICD-10-CM | POA: Diagnosis not present

## 2023-04-05 DIAGNOSIS — Z7902 Long term (current) use of antithrombotics/antiplatelets: Secondary | ICD-10-CM | POA: Diagnosis not present

## 2023-04-05 DIAGNOSIS — Z89511 Acquired absence of right leg below knee: Secondary | ICD-10-CM | POA: Diagnosis not present

## 2023-04-05 DIAGNOSIS — T8611 Kidney transplant rejection: Principal | ICD-10-CM

## 2023-04-05 DIAGNOSIS — Z299 Encounter for prophylactic measures, unspecified: Principal | ICD-10-CM

## 2023-04-05 DIAGNOSIS — I10 Essential (primary) hypertension: Principal | ICD-10-CM

## 2023-04-05 MED ORDER — PREDNISONE 5 MG TABLET
ORAL_TABLET | Freq: Every day | ORAL | 0 refills | 30 days
Start: 2023-04-05 — End: ?

## 2023-04-05 MED ORDER — ENALAPRIL MALEATE 20 MG TABLET
ORAL_TABLET | Freq: Two times a day (BID) | ORAL | 3 refills | 90 days | Status: CP
Start: 2023-04-05 — End: ?

## 2023-04-06 DIAGNOSIS — Z992 Dependence on renal dialysis: Secondary | ICD-10-CM | POA: Diagnosis not present

## 2023-04-06 DIAGNOSIS — L97529 Non-pressure chronic ulcer of other part of left foot with unspecified severity: Secondary | ICD-10-CM | POA: Diagnosis not present

## 2023-04-06 DIAGNOSIS — N186 End stage renal disease: Secondary | ICD-10-CM | POA: Diagnosis not present

## 2023-04-06 MED ORDER — SULFAMETHOXAZOLE 800 MG-TRIMETHOPRIM 160 MG TABLET
ORAL_TABLET | ORAL | 0 refills | 35 days
Start: 2023-04-06 — End: ?

## 2023-04-07 DIAGNOSIS — I120 Hypertensive chronic kidney disease with stage 5 chronic kidney disease or end stage renal disease: Secondary | ICD-10-CM | POA: Diagnosis not present

## 2023-04-07 DIAGNOSIS — N186 End stage renal disease: Secondary | ICD-10-CM | POA: Diagnosis not present

## 2023-04-07 DIAGNOSIS — Z7902 Long term (current) use of antithrombotics/antiplatelets: Secondary | ICD-10-CM | POA: Diagnosis not present

## 2023-04-07 DIAGNOSIS — L97529 Non-pressure chronic ulcer of other part of left foot with unspecified severity: Secondary | ICD-10-CM | POA: Diagnosis not present

## 2023-04-07 DIAGNOSIS — E785 Hyperlipidemia, unspecified: Secondary | ICD-10-CM | POA: Diagnosis not present

## 2023-04-07 DIAGNOSIS — Z992 Dependence on renal dialysis: Secondary | ICD-10-CM | POA: Diagnosis not present

## 2023-04-07 DIAGNOSIS — Z4781 Encounter for orthopedic aftercare following surgical amputation: Secondary | ICD-10-CM | POA: Diagnosis not present

## 2023-04-07 DIAGNOSIS — E1122 Type 2 diabetes mellitus with diabetic chronic kidney disease: Secondary | ICD-10-CM | POA: Diagnosis not present

## 2023-04-07 DIAGNOSIS — M549 Dorsalgia, unspecified: Secondary | ICD-10-CM | POA: Diagnosis not present

## 2023-04-07 DIAGNOSIS — Z89511 Acquired absence of right leg below knee: Secondary | ICD-10-CM | POA: Diagnosis not present

## 2023-04-07 DIAGNOSIS — Z4801 Encounter for change or removal of surgical wound dressing: Secondary | ICD-10-CM | POA: Diagnosis not present

## 2023-04-07 DIAGNOSIS — Z940 Kidney transplant status: Secondary | ICD-10-CM | POA: Diagnosis not present

## 2023-04-07 DIAGNOSIS — K219 Gastro-esophageal reflux disease without esophagitis: Secondary | ICD-10-CM | POA: Diagnosis not present

## 2023-04-07 DIAGNOSIS — E11621 Type 2 diabetes mellitus with foot ulcer: Secondary | ICD-10-CM | POA: Diagnosis not present

## 2023-04-07 DIAGNOSIS — T8611 Kidney transplant rejection: Principal | ICD-10-CM

## 2023-04-08 DIAGNOSIS — L97529 Non-pressure chronic ulcer of other part of left foot with unspecified severity: Secondary | ICD-10-CM | POA: Diagnosis not present

## 2023-04-08 DIAGNOSIS — Z992 Dependence on renal dialysis: Secondary | ICD-10-CM | POA: Diagnosis not present

## 2023-04-08 DIAGNOSIS — N186 End stage renal disease: Secondary | ICD-10-CM | POA: Diagnosis not present

## 2023-04-11 DIAGNOSIS — L97529 Non-pressure chronic ulcer of other part of left foot with unspecified severity: Secondary | ICD-10-CM | POA: Diagnosis not present

## 2023-04-11 DIAGNOSIS — N186 End stage renal disease: Secondary | ICD-10-CM | POA: Diagnosis not present

## 2023-04-11 DIAGNOSIS — Z992 Dependence on renal dialysis: Secondary | ICD-10-CM | POA: Diagnosis not present

## 2023-04-12 ENCOUNTER — Ambulatory Visit
Admit: 2023-04-12 | Discharge: 2023-04-13 | Attending: Student in an Organized Health Care Education/Training Program | Primary: Student in an Organized Health Care Education/Training Program

## 2023-04-12 DIAGNOSIS — I739 Peripheral vascular disease, unspecified: Principal | ICD-10-CM

## 2023-04-12 DIAGNOSIS — E11621 Type 2 diabetes mellitus with foot ulcer: Principal | ICD-10-CM

## 2023-04-12 DIAGNOSIS — L97523 Non-pressure chronic ulcer of other part of left foot with necrosis of muscle: Principal | ICD-10-CM

## 2023-04-12 DIAGNOSIS — L97509 Non-pressure chronic ulcer of other part of unspecified foot with unspecified severity: Principal | ICD-10-CM

## 2023-04-12 DIAGNOSIS — S88111D Complete traumatic amputation at level between knee and ankle, right lower leg, subsequent encounter: Principal | ICD-10-CM

## 2023-04-13 DIAGNOSIS — I120 Hypertensive chronic kidney disease with stage 5 chronic kidney disease or end stage renal disease: Secondary | ICD-10-CM | POA: Diagnosis not present

## 2023-04-13 DIAGNOSIS — Z4801 Encounter for change or removal of surgical wound dressing: Secondary | ICD-10-CM | POA: Diagnosis not present

## 2023-04-13 DIAGNOSIS — Z7902 Long term (current) use of antithrombotics/antiplatelets: Secondary | ICD-10-CM | POA: Diagnosis not present

## 2023-04-13 DIAGNOSIS — Z992 Dependence on renal dialysis: Secondary | ICD-10-CM | POA: Diagnosis not present

## 2023-04-13 DIAGNOSIS — E785 Hyperlipidemia, unspecified: Secondary | ICD-10-CM | POA: Diagnosis not present

## 2023-04-13 DIAGNOSIS — E1122 Type 2 diabetes mellitus with diabetic chronic kidney disease: Secondary | ICD-10-CM | POA: Diagnosis not present

## 2023-04-13 DIAGNOSIS — K219 Gastro-esophageal reflux disease without esophagitis: Secondary | ICD-10-CM | POA: Diagnosis not present

## 2023-04-13 DIAGNOSIS — Z89511 Acquired absence of right leg below knee: Secondary | ICD-10-CM | POA: Diagnosis not present

## 2023-04-13 DIAGNOSIS — Z940 Kidney transplant status: Secondary | ICD-10-CM | POA: Diagnosis not present

## 2023-04-13 DIAGNOSIS — Z4781 Encounter for orthopedic aftercare following surgical amputation: Secondary | ICD-10-CM | POA: Diagnosis not present

## 2023-04-13 DIAGNOSIS — M549 Dorsalgia, unspecified: Secondary | ICD-10-CM | POA: Diagnosis not present

## 2023-04-13 DIAGNOSIS — L97529 Non-pressure chronic ulcer of other part of left foot with unspecified severity: Secondary | ICD-10-CM | POA: Diagnosis not present

## 2023-04-13 DIAGNOSIS — N186 End stage renal disease: Secondary | ICD-10-CM | POA: Diagnosis not present

## 2023-04-14 ENCOUNTER — Ambulatory Visit
Admit: 2023-04-14 | Discharge: 2023-04-15 | Attending: Student in an Organized Health Care Education/Training Program | Primary: Student in an Organized Health Care Education/Training Program

## 2023-04-14 ENCOUNTER — Ambulatory Visit: Admit: 2023-04-14 | Discharge: 2023-04-15

## 2023-04-14 DIAGNOSIS — E785 Hyperlipidemia, unspecified: Secondary | ICD-10-CM | POA: Diagnosis not present

## 2023-04-14 DIAGNOSIS — E1142 Type 2 diabetes mellitus with diabetic polyneuropathy: Secondary | ICD-10-CM | POA: Diagnosis not present

## 2023-04-14 DIAGNOSIS — Z7902 Long term (current) use of antithrombotics/antiplatelets: Secondary | ICD-10-CM | POA: Diagnosis not present

## 2023-04-14 DIAGNOSIS — E11319 Type 2 diabetes mellitus with unspecified diabetic retinopathy without macular edema: Secondary | ICD-10-CM | POA: Diagnosis not present

## 2023-04-14 DIAGNOSIS — S91102A Unspecified open wound of left great toe without damage to nail, initial encounter: Secondary | ICD-10-CM | POA: Diagnosis not present

## 2023-04-14 DIAGNOSIS — N184 Chronic kidney disease, stage 4 (severe): Secondary | ICD-10-CM | POA: Diagnosis not present

## 2023-04-14 DIAGNOSIS — Z4801 Encounter for change or removal of surgical wound dressing: Secondary | ICD-10-CM | POA: Diagnosis not present

## 2023-04-14 DIAGNOSIS — L97523 Non-pressure chronic ulcer of other part of left foot with necrosis of muscle: Secondary | ICD-10-CM | POA: Diagnosis not present

## 2023-04-14 DIAGNOSIS — Z992 Dependence on renal dialysis: Secondary | ICD-10-CM | POA: Diagnosis not present

## 2023-04-14 DIAGNOSIS — I739 Peripheral vascular disease, unspecified: Secondary | ICD-10-CM | POA: Diagnosis not present

## 2023-04-14 DIAGNOSIS — N186 End stage renal disease: Secondary | ICD-10-CM | POA: Diagnosis not present

## 2023-04-14 DIAGNOSIS — Z940 Kidney transplant status: Secondary | ICD-10-CM | POA: Diagnosis not present

## 2023-04-14 DIAGNOSIS — E7800 Pure hypercholesterolemia, unspecified: Secondary | ICD-10-CM | POA: Diagnosis not present

## 2023-04-14 DIAGNOSIS — Z4781 Encounter for orthopedic aftercare following surgical amputation: Secondary | ICD-10-CM | POA: Diagnosis not present

## 2023-04-14 DIAGNOSIS — M549 Dorsalgia, unspecified: Secondary | ICD-10-CM | POA: Diagnosis not present

## 2023-04-14 DIAGNOSIS — Z0389 Encounter for observation for other suspected diseases and conditions ruled out: Secondary | ICD-10-CM | POA: Diagnosis not present

## 2023-04-14 DIAGNOSIS — Z89511 Acquired absence of right leg below knee: Secondary | ICD-10-CM | POA: Diagnosis not present

## 2023-04-14 DIAGNOSIS — E1122 Type 2 diabetes mellitus with diabetic chronic kidney disease: Secondary | ICD-10-CM | POA: Diagnosis not present

## 2023-04-14 DIAGNOSIS — I10 Essential (primary) hypertension: Secondary | ICD-10-CM | POA: Diagnosis not present

## 2023-04-14 DIAGNOSIS — K219 Gastro-esophageal reflux disease without esophagitis: Secondary | ICD-10-CM | POA: Diagnosis not present

## 2023-04-14 DIAGNOSIS — G629 Polyneuropathy, unspecified: Secondary | ICD-10-CM | POA: Diagnosis not present

## 2023-04-14 DIAGNOSIS — I120 Hypertensive chronic kidney disease with stage 5 chronic kidney disease or end stage renal disease: Secondary | ICD-10-CM | POA: Diagnosis not present

## 2023-04-15 DIAGNOSIS — L97529 Non-pressure chronic ulcer of other part of left foot with unspecified severity: Secondary | ICD-10-CM | POA: Diagnosis not present

## 2023-04-15 DIAGNOSIS — Z992 Dependence on renal dialysis: Secondary | ICD-10-CM | POA: Diagnosis not present

## 2023-04-15 DIAGNOSIS — N186 End stage renal disease: Secondary | ICD-10-CM | POA: Diagnosis not present

## 2023-04-15 DIAGNOSIS — B9689 Other specified bacterial agents as the cause of diseases classified elsewhere: Secondary | ICD-10-CM | POA: Diagnosis not present

## 2023-04-15 DIAGNOSIS — S91102A Unspecified open wound of left great toe without damage to nail, initial encounter: Secondary | ICD-10-CM | POA: Diagnosis not present

## 2023-04-18 DIAGNOSIS — Z992 Dependence on renal dialysis: Secondary | ICD-10-CM | POA: Diagnosis not present

## 2023-04-18 DIAGNOSIS — L97529 Non-pressure chronic ulcer of other part of left foot with unspecified severity: Secondary | ICD-10-CM | POA: Diagnosis not present

## 2023-04-18 DIAGNOSIS — N186 End stage renal disease: Secondary | ICD-10-CM | POA: Diagnosis not present

## 2023-04-18 DIAGNOSIS — T8611 Kidney transplant rejection: Principal | ICD-10-CM

## 2023-04-19 DIAGNOSIS — Z4801 Encounter for change or removal of surgical wound dressing: Secondary | ICD-10-CM | POA: Diagnosis not present

## 2023-04-19 DIAGNOSIS — Z940 Kidney transplant status: Secondary | ICD-10-CM | POA: Diagnosis not present

## 2023-04-19 DIAGNOSIS — E785 Hyperlipidemia, unspecified: Secondary | ICD-10-CM | POA: Diagnosis not present

## 2023-04-19 DIAGNOSIS — Z7902 Long term (current) use of antithrombotics/antiplatelets: Secondary | ICD-10-CM | POA: Diagnosis not present

## 2023-04-19 DIAGNOSIS — M549 Dorsalgia, unspecified: Secondary | ICD-10-CM | POA: Diagnosis not present

## 2023-04-19 DIAGNOSIS — Z89511 Acquired absence of right leg below knee: Secondary | ICD-10-CM | POA: Diagnosis not present

## 2023-04-19 DIAGNOSIS — Z992 Dependence on renal dialysis: Secondary | ICD-10-CM | POA: Diagnosis not present

## 2023-04-19 DIAGNOSIS — K219 Gastro-esophageal reflux disease without esophagitis: Secondary | ICD-10-CM | POA: Diagnosis not present

## 2023-04-19 DIAGNOSIS — Z4781 Encounter for orthopedic aftercare following surgical amputation: Secondary | ICD-10-CM | POA: Diagnosis not present

## 2023-04-19 DIAGNOSIS — I120 Hypertensive chronic kidney disease with stage 5 chronic kidney disease or end stage renal disease: Secondary | ICD-10-CM | POA: Diagnosis not present

## 2023-04-19 DIAGNOSIS — E1122 Type 2 diabetes mellitus with diabetic chronic kidney disease: Secondary | ICD-10-CM | POA: Diagnosis not present

## 2023-04-19 DIAGNOSIS — N186 End stage renal disease: Secondary | ICD-10-CM | POA: Diagnosis not present

## 2023-04-19 DIAGNOSIS — I739 Peripheral vascular disease, unspecified: Principal | ICD-10-CM

## 2023-04-19 DIAGNOSIS — E11621 Type 2 diabetes mellitus with foot ulcer: Principal | ICD-10-CM

## 2023-04-19 DIAGNOSIS — M869 Osteomyelitis, unspecified: Principal | ICD-10-CM

## 2023-04-19 DIAGNOSIS — L97509 Non-pressure chronic ulcer of other part of unspecified foot with unspecified severity: Principal | ICD-10-CM

## 2023-04-20 DIAGNOSIS — Z992 Dependence on renal dialysis: Secondary | ICD-10-CM | POA: Diagnosis not present

## 2023-04-20 DIAGNOSIS — L97529 Non-pressure chronic ulcer of other part of left foot with unspecified severity: Secondary | ICD-10-CM | POA: Diagnosis not present

## 2023-04-20 DIAGNOSIS — N186 End stage renal disease: Secondary | ICD-10-CM | POA: Diagnosis not present

## 2023-04-21 DIAGNOSIS — Z4801 Encounter for change or removal of surgical wound dressing: Secondary | ICD-10-CM | POA: Diagnosis not present

## 2023-04-21 DIAGNOSIS — E785 Hyperlipidemia, unspecified: Secondary | ICD-10-CM | POA: Diagnosis not present

## 2023-04-21 DIAGNOSIS — Z992 Dependence on renal dialysis: Secondary | ICD-10-CM | POA: Diagnosis not present

## 2023-04-21 DIAGNOSIS — Z7902 Long term (current) use of antithrombotics/antiplatelets: Secondary | ICD-10-CM | POA: Diagnosis not present

## 2023-04-21 DIAGNOSIS — N186 End stage renal disease: Secondary | ICD-10-CM | POA: Diagnosis not present

## 2023-04-21 DIAGNOSIS — K219 Gastro-esophageal reflux disease without esophagitis: Secondary | ICD-10-CM | POA: Diagnosis not present

## 2023-04-21 DIAGNOSIS — E1122 Type 2 diabetes mellitus with diabetic chronic kidney disease: Secondary | ICD-10-CM | POA: Diagnosis not present

## 2023-04-21 DIAGNOSIS — Z4781 Encounter for orthopedic aftercare following surgical amputation: Secondary | ICD-10-CM | POA: Diagnosis not present

## 2023-04-21 DIAGNOSIS — I120 Hypertensive chronic kidney disease with stage 5 chronic kidney disease or end stage renal disease: Secondary | ICD-10-CM | POA: Diagnosis not present

## 2023-04-21 DIAGNOSIS — M549 Dorsalgia, unspecified: Secondary | ICD-10-CM | POA: Diagnosis not present

## 2023-04-21 DIAGNOSIS — Z940 Kidney transplant status: Secondary | ICD-10-CM | POA: Diagnosis not present

## 2023-04-21 DIAGNOSIS — Z89511 Acquired absence of right leg below knee: Secondary | ICD-10-CM | POA: Diagnosis not present

## 2023-04-22 DIAGNOSIS — N186 End stage renal disease: Secondary | ICD-10-CM | POA: Diagnosis not present

## 2023-04-22 DIAGNOSIS — Z992 Dependence on renal dialysis: Secondary | ICD-10-CM | POA: Diagnosis not present

## 2023-04-22 DIAGNOSIS — L97529 Non-pressure chronic ulcer of other part of left foot with unspecified severity: Secondary | ICD-10-CM | POA: Diagnosis not present

## 2023-04-25 DIAGNOSIS — L97529 Non-pressure chronic ulcer of other part of left foot with unspecified severity: Secondary | ICD-10-CM | POA: Diagnosis not present

## 2023-04-25 DIAGNOSIS — N186 End stage renal disease: Secondary | ICD-10-CM | POA: Diagnosis not present

## 2023-04-25 DIAGNOSIS — S91102A Unspecified open wound of left great toe without damage to nail, initial encounter: Secondary | ICD-10-CM | POA: Diagnosis not present

## 2023-04-25 DIAGNOSIS — Z992 Dependence on renal dialysis: Secondary | ICD-10-CM | POA: Diagnosis not present

## 2023-04-25 DIAGNOSIS — B9689 Other specified bacterial agents as the cause of diseases classified elsewhere: Secondary | ICD-10-CM | POA: Diagnosis not present

## 2023-04-28 DIAGNOSIS — Z992 Dependence on renal dialysis: Secondary | ICD-10-CM | POA: Diagnosis not present

## 2023-04-28 DIAGNOSIS — N186 End stage renal disease: Secondary | ICD-10-CM | POA: Diagnosis not present

## 2023-04-28 DIAGNOSIS — L97529 Non-pressure chronic ulcer of other part of left foot with unspecified severity: Secondary | ICD-10-CM | POA: Diagnosis not present

## 2023-04-30 DIAGNOSIS — N186 End stage renal disease: Secondary | ICD-10-CM | POA: Diagnosis not present

## 2023-04-30 DIAGNOSIS — Z992 Dependence on renal dialysis: Secondary | ICD-10-CM | POA: Diagnosis not present

## 2023-04-30 DIAGNOSIS — L97529 Non-pressure chronic ulcer of other part of left foot with unspecified severity: Secondary | ICD-10-CM | POA: Diagnosis not present

## 2023-05-02 DIAGNOSIS — B9689 Other specified bacterial agents as the cause of diseases classified elsewhere: Secondary | ICD-10-CM | POA: Diagnosis not present

## 2023-05-02 DIAGNOSIS — N186 End stage renal disease: Secondary | ICD-10-CM | POA: Diagnosis not present

## 2023-05-02 DIAGNOSIS — Z992 Dependence on renal dialysis: Secondary | ICD-10-CM | POA: Diagnosis not present

## 2023-05-02 DIAGNOSIS — L97529 Non-pressure chronic ulcer of other part of left foot with unspecified severity: Secondary | ICD-10-CM | POA: Diagnosis not present

## 2023-05-02 DIAGNOSIS — S91102A Unspecified open wound of left great toe without damage to nail, initial encounter: Secondary | ICD-10-CM | POA: Diagnosis not present

## 2023-05-03 ENCOUNTER — Inpatient Hospital Stay: Admit: 2023-05-03 | Discharge: 2023-05-04

## 2023-05-03 DIAGNOSIS — N186 End stage renal disease: Secondary | ICD-10-CM | POA: Diagnosis not present

## 2023-05-03 DIAGNOSIS — E1122 Type 2 diabetes mellitus with diabetic chronic kidney disease: Secondary | ICD-10-CM | POA: Diagnosis not present

## 2023-05-03 DIAGNOSIS — Z940 Kidney transplant status: Secondary | ICD-10-CM | POA: Diagnosis not present

## 2023-05-03 DIAGNOSIS — E785 Hyperlipidemia, unspecified: Secondary | ICD-10-CM | POA: Diagnosis not present

## 2023-05-03 DIAGNOSIS — M549 Dorsalgia, unspecified: Secondary | ICD-10-CM | POA: Diagnosis not present

## 2023-05-03 DIAGNOSIS — K219 Gastro-esophageal reflux disease without esophagitis: Secondary | ICD-10-CM | POA: Diagnosis not present

## 2023-05-03 DIAGNOSIS — I10 Essential (primary) hypertension: Secondary | ICD-10-CM | POA: Diagnosis not present

## 2023-05-03 DIAGNOSIS — Z7902 Long term (current) use of antithrombotics/antiplatelets: Secondary | ICD-10-CM | POA: Diagnosis not present

## 2023-05-03 DIAGNOSIS — E11621 Type 2 diabetes mellitus with foot ulcer: Secondary | ICD-10-CM | POA: Diagnosis not present

## 2023-05-03 DIAGNOSIS — Z4801 Encounter for change or removal of surgical wound dressing: Secondary | ICD-10-CM | POA: Diagnosis not present

## 2023-05-03 DIAGNOSIS — Z89511 Acquired absence of right leg below knee: Secondary | ICD-10-CM | POA: Diagnosis not present

## 2023-05-03 DIAGNOSIS — Z992 Dependence on renal dialysis: Secondary | ICD-10-CM | POA: Diagnosis not present

## 2023-05-03 DIAGNOSIS — I70209 Unspecified atherosclerosis of native arteries of extremities, unspecified extremity: Secondary | ICD-10-CM | POA: Diagnosis not present

## 2023-05-03 DIAGNOSIS — L97523 Non-pressure chronic ulcer of other part of left foot with necrosis of muscle: Secondary | ICD-10-CM | POA: Diagnosis not present

## 2023-05-03 DIAGNOSIS — I120 Hypertensive chronic kidney disease with stage 5 chronic kidney disease or end stage renal disease: Secondary | ICD-10-CM | POA: Diagnosis not present

## 2023-05-03 DIAGNOSIS — Z4781 Encounter for orthopedic aftercare following surgical amputation: Secondary | ICD-10-CM | POA: Diagnosis not present

## 2023-05-04 DIAGNOSIS — Z299 Encounter for prophylactic measures, unspecified: Principal | ICD-10-CM

## 2023-05-04 DIAGNOSIS — T8611 Kidney transplant rejection: Principal | ICD-10-CM

## 2023-05-04 MED ORDER — SULFAMETHOXAZOLE 800 MG-TRIMETHOPRIM 160 MG TABLET
ORAL_TABLET | ORAL | 0 refills | 0.00 days
Start: 2023-05-04 — End: ?

## 2023-05-06 DIAGNOSIS — N186 End stage renal disease: Secondary | ICD-10-CM | POA: Diagnosis not present

## 2023-05-06 DIAGNOSIS — K219 Gastro-esophageal reflux disease without esophagitis: Secondary | ICD-10-CM | POA: Diagnosis not present

## 2023-05-06 DIAGNOSIS — E1122 Type 2 diabetes mellitus with diabetic chronic kidney disease: Secondary | ICD-10-CM | POA: Diagnosis not present

## 2023-05-06 DIAGNOSIS — M549 Dorsalgia, unspecified: Secondary | ICD-10-CM | POA: Diagnosis not present

## 2023-05-06 DIAGNOSIS — Z7902 Long term (current) use of antithrombotics/antiplatelets: Secondary | ICD-10-CM | POA: Diagnosis not present

## 2023-05-06 DIAGNOSIS — Z940 Kidney transplant status: Secondary | ICD-10-CM | POA: Diagnosis not present

## 2023-05-06 DIAGNOSIS — E785 Hyperlipidemia, unspecified: Secondary | ICD-10-CM | POA: Diagnosis not present

## 2023-05-06 DIAGNOSIS — Z89511 Acquired absence of right leg below knee: Secondary | ICD-10-CM | POA: Diagnosis not present

## 2023-05-06 DIAGNOSIS — Z4801 Encounter for change or removal of surgical wound dressing: Secondary | ICD-10-CM | POA: Diagnosis not present

## 2023-05-06 DIAGNOSIS — Z4781 Encounter for orthopedic aftercare following surgical amputation: Secondary | ICD-10-CM | POA: Diagnosis not present

## 2023-05-06 DIAGNOSIS — I120 Hypertensive chronic kidney disease with stage 5 chronic kidney disease or end stage renal disease: Secondary | ICD-10-CM | POA: Diagnosis not present

## 2023-05-06 DIAGNOSIS — Z992 Dependence on renal dialysis: Secondary | ICD-10-CM | POA: Diagnosis not present

## 2023-05-06 DIAGNOSIS — T8611 Kidney transplant rejection: Principal | ICD-10-CM

## 2023-05-06 DIAGNOSIS — N184 Chronic kidney disease, stage 4 (severe): Principal | ICD-10-CM

## 2023-05-08 DIAGNOSIS — T8611 Kidney transplant rejection: Principal | ICD-10-CM

## 2023-05-08 DIAGNOSIS — Z48298 Encounter for aftercare following other organ transplant: Principal | ICD-10-CM

## 2023-05-09 DIAGNOSIS — Z992 Dependence on renal dialysis: Secondary | ICD-10-CM | POA: Diagnosis not present

## 2023-05-09 DIAGNOSIS — B9689 Other specified bacterial agents as the cause of diseases classified elsewhere: Secondary | ICD-10-CM | POA: Diagnosis not present

## 2023-05-09 DIAGNOSIS — S91102A Unspecified open wound of left great toe without damage to nail, initial encounter: Secondary | ICD-10-CM | POA: Diagnosis not present

## 2023-05-09 DIAGNOSIS — N186 End stage renal disease: Secondary | ICD-10-CM | POA: Diagnosis not present

## 2023-05-10 ENCOUNTER — Ambulatory Visit
Admit: 2023-05-10 | Discharge: 2023-05-11 | Attending: Student in an Organized Health Care Education/Training Program | Primary: Student in an Organized Health Care Education/Training Program

## 2023-05-10 DIAGNOSIS — L97523 Non-pressure chronic ulcer of other part of left foot with necrosis of muscle: Secondary | ICD-10-CM | POA: Diagnosis not present

## 2023-05-10 DIAGNOSIS — S88111D Complete traumatic amputation at level between knee and ankle, right lower leg, subsequent encounter: Secondary | ICD-10-CM | POA: Diagnosis not present

## 2023-05-10 DIAGNOSIS — T8781 Dehiscence of amputation stump: Secondary | ICD-10-CM | POA: Diagnosis not present

## 2023-05-10 DIAGNOSIS — Z792 Long term (current) use of antibiotics: Secondary | ICD-10-CM | POA: Diagnosis not present

## 2023-05-10 DIAGNOSIS — S91102A Unspecified open wound of left great toe without damage to nail, initial encounter: Secondary | ICD-10-CM | POA: Diagnosis not present

## 2023-05-10 DIAGNOSIS — Z89519 Acquired absence of unspecified leg below knee: Secondary | ICD-10-CM | POA: Diagnosis not present

## 2023-05-10 DIAGNOSIS — E1169 Type 2 diabetes mellitus with other specified complication: Secondary | ICD-10-CM | POA: Diagnosis not present

## 2023-05-10 DIAGNOSIS — E1151 Type 2 diabetes mellitus with diabetic peripheral angiopathy without gangrene: Secondary | ICD-10-CM | POA: Diagnosis not present

## 2023-05-10 DIAGNOSIS — L97522 Non-pressure chronic ulcer of other part of left foot with fat layer exposed: Secondary | ICD-10-CM | POA: Diagnosis not present

## 2023-05-10 DIAGNOSIS — I10 Essential (primary) hypertension: Secondary | ICD-10-CM | POA: Diagnosis not present

## 2023-05-10 DIAGNOSIS — X58XXXD Exposure to other specified factors, subsequent encounter: Secondary | ICD-10-CM | POA: Diagnosis not present

## 2023-05-10 DIAGNOSIS — E7800 Pure hypercholesterolemia, unspecified: Secondary | ICD-10-CM | POA: Diagnosis not present

## 2023-05-10 DIAGNOSIS — E11621 Type 2 diabetes mellitus with foot ulcer: Secondary | ICD-10-CM | POA: Diagnosis not present

## 2023-05-10 DIAGNOSIS — Z89511 Acquired absence of right leg below knee: Secondary | ICD-10-CM | POA: Diagnosis not present

## 2023-05-10 DIAGNOSIS — M869 Osteomyelitis, unspecified: Secondary | ICD-10-CM | POA: Diagnosis not present

## 2023-05-10 DIAGNOSIS — Z8673 Personal history of transient ischemic attack (TIA), and cerebral infarction without residual deficits: Secondary | ICD-10-CM | POA: Diagnosis not present

## 2023-05-10 DIAGNOSIS — N184 Chronic kidney disease, stage 4 (severe): Principal | ICD-10-CM

## 2023-05-10 DIAGNOSIS — T8611 Kidney transplant rejection: Principal | ICD-10-CM

## 2023-05-10 DIAGNOSIS — I739 Peripheral vascular disease, unspecified: Principal | ICD-10-CM

## 2023-05-10 DIAGNOSIS — Z940 Kidney transplant status: Principal | ICD-10-CM

## 2023-05-10 MED ORDER — SEVELAMER CARBONATE 800 MG TABLET
ORAL_TABLET | Freq: Three times a day (TID) | ORAL | 11 refills | 30.00 days | Status: CP
Start: 2023-05-10 — End: 2024-05-09

## 2023-05-10 MED ORDER — SODIUM HYPOCHLORITE 0.25 % SOLUTION
Freq: Every day | TOPICAL | 2 refills | 0.00 days | Status: CP
Start: 2023-05-10 — End: ?

## 2023-05-10 MED ORDER — SILVER SULFADIAZINE 1 % TOPICAL CREAM
Freq: Every day | TOPICAL | 3 refills | 0.00 days | Status: CP
Start: 2023-05-10 — End: 2024-05-09

## 2023-05-11 DIAGNOSIS — Z940 Kidney transplant status: Principal | ICD-10-CM

## 2023-05-12 DIAGNOSIS — M549 Dorsalgia, unspecified: Secondary | ICD-10-CM | POA: Diagnosis not present

## 2023-05-12 DIAGNOSIS — Z7902 Long term (current) use of antithrombotics/antiplatelets: Secondary | ICD-10-CM | POA: Diagnosis not present

## 2023-05-12 DIAGNOSIS — Z89511 Acquired absence of right leg below knee: Secondary | ICD-10-CM | POA: Diagnosis not present

## 2023-05-12 DIAGNOSIS — Z4781 Encounter for orthopedic aftercare following surgical amputation: Secondary | ICD-10-CM | POA: Diagnosis not present

## 2023-05-12 DIAGNOSIS — Z992 Dependence on renal dialysis: Secondary | ICD-10-CM | POA: Diagnosis not present

## 2023-05-12 DIAGNOSIS — E1122 Type 2 diabetes mellitus with diabetic chronic kidney disease: Secondary | ICD-10-CM | POA: Diagnosis not present

## 2023-05-12 DIAGNOSIS — I120 Hypertensive chronic kidney disease with stage 5 chronic kidney disease or end stage renal disease: Secondary | ICD-10-CM | POA: Diagnosis not present

## 2023-05-12 DIAGNOSIS — Z4801 Encounter for change or removal of surgical wound dressing: Secondary | ICD-10-CM | POA: Diagnosis not present

## 2023-05-12 DIAGNOSIS — Z940 Kidney transplant status: Secondary | ICD-10-CM | POA: Diagnosis not present

## 2023-05-12 DIAGNOSIS — E785 Hyperlipidemia, unspecified: Secondary | ICD-10-CM | POA: Diagnosis not present

## 2023-05-12 DIAGNOSIS — K219 Gastro-esophageal reflux disease without esophagitis: Secondary | ICD-10-CM | POA: Diagnosis not present

## 2023-05-12 DIAGNOSIS — N186 End stage renal disease: Secondary | ICD-10-CM | POA: Diagnosis not present

## 2023-05-16 DIAGNOSIS — Z792 Long term (current) use of antibiotics: Secondary | ICD-10-CM | POA: Diagnosis not present

## 2023-05-16 DIAGNOSIS — M869 Osteomyelitis, unspecified: Secondary | ICD-10-CM | POA: Diagnosis not present

## 2023-05-16 DIAGNOSIS — N186 End stage renal disease: Secondary | ICD-10-CM | POA: Diagnosis not present

## 2023-05-16 DIAGNOSIS — Z992 Dependence on renal dialysis: Secondary | ICD-10-CM | POA: Diagnosis not present

## 2023-05-16 DIAGNOSIS — T8611 Kidney transplant rejection: Principal | ICD-10-CM

## 2023-05-17 ENCOUNTER — Encounter (INDEPENDENT_AMBULATORY_CARE_PROVIDER_SITE_OTHER): Payer: Medicare HMO

## 2023-05-17 ENCOUNTER — Telehealth (INDEPENDENT_AMBULATORY_CARE_PROVIDER_SITE_OTHER): Payer: Self-pay | Admitting: Nurse Practitioner

## 2023-05-17 DIAGNOSIS — S88111D Complete traumatic amputation at level between knee and ankle, right lower leg, subsequent encounter: Principal | ICD-10-CM

## 2023-05-17 DIAGNOSIS — I739 Peripheral vascular disease, unspecified: Principal | ICD-10-CM

## 2023-05-17 NOTE — Telephone Encounter (Signed)
 New message    Cancel Rsn: Patient (Patient states he is being treated by a vascular doctor in [place])

## 2023-05-18 DIAGNOSIS — T8611 Kidney transplant rejection: Principal | ICD-10-CM

## 2023-05-19 DIAGNOSIS — Z4801 Encounter for change or removal of surgical wound dressing: Secondary | ICD-10-CM | POA: Diagnosis not present

## 2023-05-19 DIAGNOSIS — E785 Hyperlipidemia, unspecified: Secondary | ICD-10-CM | POA: Diagnosis not present

## 2023-05-19 DIAGNOSIS — Z7902 Long term (current) use of antithrombotics/antiplatelets: Secondary | ICD-10-CM | POA: Diagnosis not present

## 2023-05-19 DIAGNOSIS — K219 Gastro-esophageal reflux disease without esophagitis: Secondary | ICD-10-CM | POA: Diagnosis not present

## 2023-05-19 DIAGNOSIS — M549 Dorsalgia, unspecified: Secondary | ICD-10-CM | POA: Diagnosis not present

## 2023-05-19 DIAGNOSIS — Z4781 Encounter for orthopedic aftercare following surgical amputation: Secondary | ICD-10-CM | POA: Diagnosis not present

## 2023-05-19 DIAGNOSIS — E1122 Type 2 diabetes mellitus with diabetic chronic kidney disease: Secondary | ICD-10-CM | POA: Diagnosis not present

## 2023-05-19 DIAGNOSIS — N186 End stage renal disease: Secondary | ICD-10-CM | POA: Diagnosis not present

## 2023-05-19 DIAGNOSIS — I120 Hypertensive chronic kidney disease with stage 5 chronic kidney disease or end stage renal disease: Secondary | ICD-10-CM | POA: Diagnosis not present

## 2023-05-19 DIAGNOSIS — Z89511 Acquired absence of right leg below knee: Secondary | ICD-10-CM | POA: Diagnosis not present

## 2023-05-19 DIAGNOSIS — Z940 Kidney transplant status: Secondary | ICD-10-CM | POA: Diagnosis not present

## 2023-05-19 DIAGNOSIS — Z992 Dependence on renal dialysis: Secondary | ICD-10-CM | POA: Diagnosis not present

## 2023-05-23 DIAGNOSIS — Z792 Long term (current) use of antibiotics: Secondary | ICD-10-CM | POA: Diagnosis not present

## 2023-05-23 DIAGNOSIS — N186 End stage renal disease: Secondary | ICD-10-CM | POA: Diagnosis not present

## 2023-05-23 DIAGNOSIS — Z992 Dependence on renal dialysis: Secondary | ICD-10-CM | POA: Diagnosis not present

## 2023-05-23 DIAGNOSIS — M869 Osteomyelitis, unspecified: Secondary | ICD-10-CM | POA: Diagnosis not present

## 2023-05-24 ENCOUNTER — Encounter (INDEPENDENT_AMBULATORY_CARE_PROVIDER_SITE_OTHER): Payer: Medicare HMO | Admitting: Nurse Practitioner

## 2023-05-26 DIAGNOSIS — M549 Dorsalgia, unspecified: Secondary | ICD-10-CM | POA: Diagnosis not present

## 2023-05-26 DIAGNOSIS — E1122 Type 2 diabetes mellitus with diabetic chronic kidney disease: Secondary | ICD-10-CM | POA: Diagnosis not present

## 2023-05-26 DIAGNOSIS — I120 Hypertensive chronic kidney disease with stage 5 chronic kidney disease or end stage renal disease: Secondary | ICD-10-CM | POA: Diagnosis not present

## 2023-05-26 DIAGNOSIS — Z992 Dependence on renal dialysis: Secondary | ICD-10-CM | POA: Diagnosis not present

## 2023-05-26 DIAGNOSIS — K219 Gastro-esophageal reflux disease without esophagitis: Secondary | ICD-10-CM | POA: Diagnosis not present

## 2023-05-26 DIAGNOSIS — Z89511 Acquired absence of right leg below knee: Secondary | ICD-10-CM | POA: Diagnosis not present

## 2023-05-26 DIAGNOSIS — S91102D Unspecified open wound of left great toe without damage to nail, subsequent encounter: Secondary | ICD-10-CM | POA: Diagnosis not present

## 2023-05-26 DIAGNOSIS — E785 Hyperlipidemia, unspecified: Secondary | ICD-10-CM | POA: Diagnosis not present

## 2023-05-26 DIAGNOSIS — Z7902 Long term (current) use of antithrombotics/antiplatelets: Secondary | ICD-10-CM | POA: Diagnosis not present

## 2023-05-26 DIAGNOSIS — Z940 Kidney transplant status: Secondary | ICD-10-CM | POA: Diagnosis not present

## 2023-05-26 DIAGNOSIS — Z4781 Encounter for orthopedic aftercare following surgical amputation: Secondary | ICD-10-CM | POA: Diagnosis not present

## 2023-05-26 DIAGNOSIS — N186 End stage renal disease: Secondary | ICD-10-CM | POA: Diagnosis not present

## 2023-05-27 DIAGNOSIS — K219 Gastro-esophageal reflux disease without esophagitis: Secondary | ICD-10-CM | POA: Diagnosis not present

## 2023-05-27 DIAGNOSIS — E785 Hyperlipidemia, unspecified: Secondary | ICD-10-CM | POA: Diagnosis not present

## 2023-05-27 DIAGNOSIS — I2510 Atherosclerotic heart disease of native coronary artery without angina pectoris: Secondary | ICD-10-CM | POA: Diagnosis not present

## 2023-05-27 DIAGNOSIS — N186 End stage renal disease: Secondary | ICD-10-CM | POA: Diagnosis not present

## 2023-05-27 DIAGNOSIS — T82858A Stenosis of vascular prosthetic devices, implants and grafts, initial encounter: Secondary | ICD-10-CM | POA: Diagnosis not present

## 2023-05-27 DIAGNOSIS — I871 Compression of vein: Secondary | ICD-10-CM | POA: Diagnosis not present

## 2023-05-27 DIAGNOSIS — D631 Anemia in chronic kidney disease: Secondary | ICD-10-CM | POA: Diagnosis not present

## 2023-05-27 DIAGNOSIS — I120 Hypertensive chronic kidney disease with stage 5 chronic kidney disease or end stage renal disease: Secondary | ICD-10-CM | POA: Diagnosis not present

## 2023-05-27 DIAGNOSIS — E1151 Type 2 diabetes mellitus with diabetic peripheral angiopathy without gangrene: Secondary | ICD-10-CM | POA: Diagnosis not present

## 2023-05-27 DIAGNOSIS — T82838A Hemorrhage of vascular prosthetic devices, implants and grafts, initial encounter: Secondary | ICD-10-CM | POA: Diagnosis not present

## 2023-05-27 DIAGNOSIS — T82898A Other specified complication of vascular prosthetic devices, implants and grafts, initial encounter: Secondary | ICD-10-CM | POA: Diagnosis not present

## 2023-05-27 DIAGNOSIS — E1122 Type 2 diabetes mellitus with diabetic chronic kidney disease: Secondary | ICD-10-CM | POA: Diagnosis not present

## 2023-05-30 ENCOUNTER — Inpatient Hospital Stay: Admit: 2023-05-30 | Discharge: 2023-05-30

## 2023-05-30 DIAGNOSIS — I70222 Atherosclerosis of native arteries of extremities with rest pain, left leg: Secondary | ICD-10-CM | POA: Diagnosis not present

## 2023-05-30 DIAGNOSIS — I771 Stricture of artery: Secondary | ICD-10-CM | POA: Diagnosis not present

## 2023-05-30 DIAGNOSIS — R2689 Other abnormalities of gait and mobility: Secondary | ICD-10-CM | POA: Diagnosis not present

## 2023-05-30 DIAGNOSIS — L97523 Non-pressure chronic ulcer of other part of left foot with necrosis of muscle: Principal | ICD-10-CM

## 2023-05-30 DIAGNOSIS — E11621 Type 2 diabetes mellitus with foot ulcer: Principal | ICD-10-CM

## 2023-05-30 DIAGNOSIS — T8611 Kidney transplant rejection: Principal | ICD-10-CM

## 2023-05-30 DIAGNOSIS — L97509 Non-pressure chronic ulcer of other part of unspecified foot with unspecified severity: Principal | ICD-10-CM

## 2023-05-30 DIAGNOSIS — M869 Osteomyelitis, unspecified: Principal | ICD-10-CM

## 2023-05-30 DIAGNOSIS — I739 Peripheral vascular disease, unspecified: Principal | ICD-10-CM

## 2023-05-30 DIAGNOSIS — S88111D Complete traumatic amputation at level between knee and ankle, right lower leg, subsequent encounter: Principal | ICD-10-CM

## 2023-05-31 DIAGNOSIS — Z992 Dependence on renal dialysis: Secondary | ICD-10-CM | POA: Diagnosis not present

## 2023-05-31 DIAGNOSIS — N186 End stage renal disease: Secondary | ICD-10-CM | POA: Diagnosis not present

## 2023-06-02 ENCOUNTER — Ambulatory Visit: Admit: 2023-06-02 | Discharge: 2023-06-03 | Payer: MEDICARE

## 2023-06-02 DIAGNOSIS — E7800 Pure hypercholesterolemia, unspecified: Secondary | ICD-10-CM | POA: Diagnosis not present

## 2023-06-02 DIAGNOSIS — E1169 Type 2 diabetes mellitus with other specified complication: Secondary | ICD-10-CM | POA: Diagnosis not present

## 2023-06-02 DIAGNOSIS — E11621 Type 2 diabetes mellitus with foot ulcer: Secondary | ICD-10-CM | POA: Diagnosis not present

## 2023-06-02 DIAGNOSIS — L97523 Non-pressure chronic ulcer of other part of left foot with necrosis of muscle: Secondary | ICD-10-CM | POA: Diagnosis not present

## 2023-06-02 DIAGNOSIS — E1151 Type 2 diabetes mellitus with diabetic peripheral angiopathy without gangrene: Secondary | ICD-10-CM | POA: Diagnosis not present

## 2023-06-02 DIAGNOSIS — Z89511 Acquired absence of right leg below knee: Secondary | ICD-10-CM | POA: Diagnosis not present

## 2023-06-02 DIAGNOSIS — M869 Osteomyelitis, unspecified: Secondary | ICD-10-CM | POA: Diagnosis not present

## 2023-06-02 DIAGNOSIS — I10 Essential (primary) hypertension: Secondary | ICD-10-CM | POA: Diagnosis not present

## 2023-06-02 DIAGNOSIS — I739 Peripheral vascular disease, unspecified: Principal | ICD-10-CM

## 2023-06-03 DIAGNOSIS — Z992 Dependence on renal dialysis: Secondary | ICD-10-CM | POA: Diagnosis not present

## 2023-06-03 DIAGNOSIS — N186 End stage renal disease: Secondary | ICD-10-CM | POA: Diagnosis not present

## 2023-06-04 DIAGNOSIS — N186 End stage renal disease: Secondary | ICD-10-CM | POA: Diagnosis not present

## 2023-06-04 DIAGNOSIS — Z992 Dependence on renal dialysis: Secondary | ICD-10-CM | POA: Diagnosis not present

## 2023-06-05 DIAGNOSIS — T8611 Kidney transplant rejection: Principal | ICD-10-CM

## 2023-06-05 DIAGNOSIS — Z48298 Encounter for aftercare following other organ transplant: Principal | ICD-10-CM

## 2023-06-06 DIAGNOSIS — Z992 Dependence on renal dialysis: Secondary | ICD-10-CM | POA: Diagnosis not present

## 2023-06-06 DIAGNOSIS — N186 End stage renal disease: Secondary | ICD-10-CM | POA: Diagnosis not present

## 2023-06-07 ENCOUNTER — Inpatient Hospital Stay: Admit: 2023-06-07 | Discharge: 2023-06-08 | Payer: MEDICARE

## 2023-06-07 DIAGNOSIS — I739 Peripheral vascular disease, unspecified: Secondary | ICD-10-CM | POA: Diagnosis not present

## 2023-06-07 DIAGNOSIS — L97523 Non-pressure chronic ulcer of other part of left foot with necrosis of muscle: Secondary | ICD-10-CM | POA: Diagnosis not present

## 2023-06-07 DIAGNOSIS — M8950 Osteolysis, unspecified site: Secondary | ICD-10-CM | POA: Diagnosis not present

## 2023-06-07 DIAGNOSIS — M869 Osteomyelitis, unspecified: Secondary | ICD-10-CM | POA: Diagnosis not present

## 2023-06-07 DIAGNOSIS — L97529 Non-pressure chronic ulcer of other part of left foot with unspecified severity: Secondary | ICD-10-CM | POA: Diagnosis not present

## 2023-06-08 DIAGNOSIS — Z992 Dependence on renal dialysis: Secondary | ICD-10-CM | POA: Diagnosis not present

## 2023-06-08 DIAGNOSIS — N186 End stage renal disease: Secondary | ICD-10-CM | POA: Diagnosis not present

## 2023-06-09 DIAGNOSIS — E1122 Type 2 diabetes mellitus with diabetic chronic kidney disease: Secondary | ICD-10-CM | POA: Diagnosis not present

## 2023-06-09 DIAGNOSIS — N186 End stage renal disease: Secondary | ICD-10-CM | POA: Diagnosis not present

## 2023-06-09 DIAGNOSIS — Z89511 Acquired absence of right leg below knee: Secondary | ICD-10-CM | POA: Diagnosis not present

## 2023-06-09 DIAGNOSIS — Z940 Kidney transplant status: Secondary | ICD-10-CM | POA: Diagnosis not present

## 2023-06-09 DIAGNOSIS — M549 Dorsalgia, unspecified: Secondary | ICD-10-CM | POA: Diagnosis not present

## 2023-06-09 DIAGNOSIS — E785 Hyperlipidemia, unspecified: Secondary | ICD-10-CM | POA: Diagnosis not present

## 2023-06-09 DIAGNOSIS — S91102D Unspecified open wound of left great toe without damage to nail, subsequent encounter: Secondary | ICD-10-CM | POA: Diagnosis not present

## 2023-06-09 DIAGNOSIS — I120 Hypertensive chronic kidney disease with stage 5 chronic kidney disease or end stage renal disease: Secondary | ICD-10-CM | POA: Diagnosis not present

## 2023-06-09 DIAGNOSIS — Z992 Dependence on renal dialysis: Secondary | ICD-10-CM | POA: Diagnosis not present

## 2023-06-09 DIAGNOSIS — Z7902 Long term (current) use of antithrombotics/antiplatelets: Secondary | ICD-10-CM | POA: Diagnosis not present

## 2023-06-09 DIAGNOSIS — Z4781 Encounter for orthopedic aftercare following surgical amputation: Secondary | ICD-10-CM | POA: Diagnosis not present

## 2023-06-09 DIAGNOSIS — K219 Gastro-esophageal reflux disease without esophagitis: Secondary | ICD-10-CM | POA: Diagnosis not present

## 2023-06-09 DIAGNOSIS — I739 Peripheral vascular disease, unspecified: Principal | ICD-10-CM

## 2023-06-10 DIAGNOSIS — N186 End stage renal disease: Secondary | ICD-10-CM | POA: Diagnosis not present

## 2023-06-10 DIAGNOSIS — Z992 Dependence on renal dialysis: Secondary | ICD-10-CM | POA: Diagnosis not present

## 2023-06-13 DIAGNOSIS — N186 End stage renal disease: Secondary | ICD-10-CM | POA: Diagnosis not present

## 2023-06-13 DIAGNOSIS — Z992 Dependence on renal dialysis: Secondary | ICD-10-CM | POA: Diagnosis not present

## 2023-06-13 MED ORDER — SULFAMETHOXAZOLE 800 MG-TRIMETHOPRIM 160 MG TABLET
ORAL_TABLET | ORAL | 0 refills | 35.00 days
Start: 2023-06-13 — End: ?

## 2023-06-14 DIAGNOSIS — I739 Peripheral vascular disease, unspecified: Principal | ICD-10-CM

## 2023-06-14 DIAGNOSIS — M869 Osteomyelitis, unspecified: Principal | ICD-10-CM

## 2023-06-14 MED ORDER — DOXYCYCLINE HYCLATE 100 MG TABLET
ORAL_TABLET | Freq: Two times a day (BID) | ORAL | 0 refills | 14.00 days | Status: CP
Start: 2023-06-14 — End: 2023-06-28

## 2023-06-15 DIAGNOSIS — Z992 Dependence on renal dialysis: Secondary | ICD-10-CM | POA: Diagnosis not present

## 2023-06-15 DIAGNOSIS — N186 End stage renal disease: Secondary | ICD-10-CM | POA: Diagnosis not present

## 2023-06-16 DIAGNOSIS — Z7902 Long term (current) use of antithrombotics/antiplatelets: Secondary | ICD-10-CM | POA: Diagnosis not present

## 2023-06-16 DIAGNOSIS — K219 Gastro-esophageal reflux disease without esophagitis: Secondary | ICD-10-CM | POA: Diagnosis not present

## 2023-06-16 DIAGNOSIS — Z4781 Encounter for orthopedic aftercare following surgical amputation: Secondary | ICD-10-CM | POA: Diagnosis not present

## 2023-06-16 DIAGNOSIS — N186 End stage renal disease: Secondary | ICD-10-CM | POA: Diagnosis not present

## 2023-06-16 DIAGNOSIS — I120 Hypertensive chronic kidney disease with stage 5 chronic kidney disease or end stage renal disease: Secondary | ICD-10-CM | POA: Diagnosis not present

## 2023-06-16 DIAGNOSIS — S91102D Unspecified open wound of left great toe without damage to nail, subsequent encounter: Secondary | ICD-10-CM | POA: Diagnosis not present

## 2023-06-16 DIAGNOSIS — Z992 Dependence on renal dialysis: Secondary | ICD-10-CM | POA: Diagnosis not present

## 2023-06-16 DIAGNOSIS — M549 Dorsalgia, unspecified: Secondary | ICD-10-CM | POA: Diagnosis not present

## 2023-06-16 DIAGNOSIS — Z89511 Acquired absence of right leg below knee: Secondary | ICD-10-CM | POA: Diagnosis not present

## 2023-06-16 DIAGNOSIS — E1122 Type 2 diabetes mellitus with diabetic chronic kidney disease: Secondary | ICD-10-CM | POA: Diagnosis not present

## 2023-06-16 DIAGNOSIS — Z940 Kidney transplant status: Secondary | ICD-10-CM | POA: Diagnosis not present

## 2023-06-16 DIAGNOSIS — E785 Hyperlipidemia, unspecified: Secondary | ICD-10-CM | POA: Diagnosis not present

## 2023-06-18 DIAGNOSIS — N186 End stage renal disease: Secondary | ICD-10-CM | POA: Diagnosis not present

## 2023-06-18 DIAGNOSIS — Z992 Dependence on renal dialysis: Secondary | ICD-10-CM | POA: Diagnosis not present

## 2023-06-20 DIAGNOSIS — N186 End stage renal disease: Secondary | ICD-10-CM | POA: Diagnosis not present

## 2023-06-20 DIAGNOSIS — Z992 Dependence on renal dialysis: Secondary | ICD-10-CM | POA: Diagnosis not present

## 2023-06-21 DIAGNOSIS — S91102D Unspecified open wound of left great toe without damage to nail, subsequent encounter: Secondary | ICD-10-CM | POA: Diagnosis not present

## 2023-06-21 DIAGNOSIS — Z7902 Long term (current) use of antithrombotics/antiplatelets: Secondary | ICD-10-CM | POA: Diagnosis not present

## 2023-06-21 DIAGNOSIS — K219 Gastro-esophageal reflux disease without esophagitis: Secondary | ICD-10-CM | POA: Diagnosis not present

## 2023-06-21 DIAGNOSIS — N186 End stage renal disease: Secondary | ICD-10-CM | POA: Diagnosis not present

## 2023-06-21 DIAGNOSIS — Z992 Dependence on renal dialysis: Secondary | ICD-10-CM | POA: Diagnosis not present

## 2023-06-21 DIAGNOSIS — E1122 Type 2 diabetes mellitus with diabetic chronic kidney disease: Secondary | ICD-10-CM | POA: Diagnosis not present

## 2023-06-21 DIAGNOSIS — I120 Hypertensive chronic kidney disease with stage 5 chronic kidney disease or end stage renal disease: Secondary | ICD-10-CM | POA: Diagnosis not present

## 2023-06-21 DIAGNOSIS — Z940 Kidney transplant status: Secondary | ICD-10-CM | POA: Diagnosis not present

## 2023-06-21 DIAGNOSIS — Z89511 Acquired absence of right leg below knee: Secondary | ICD-10-CM | POA: Diagnosis not present

## 2023-06-21 DIAGNOSIS — Z4781 Encounter for orthopedic aftercare following surgical amputation: Secondary | ICD-10-CM | POA: Diagnosis not present

## 2023-06-21 DIAGNOSIS — M549 Dorsalgia, unspecified: Secondary | ICD-10-CM | POA: Diagnosis not present

## 2023-06-21 DIAGNOSIS — E785 Hyperlipidemia, unspecified: Secondary | ICD-10-CM | POA: Diagnosis not present

## 2023-06-22 DIAGNOSIS — T8611 Kidney transplant rejection: Principal | ICD-10-CM

## 2023-06-22 DIAGNOSIS — Z299 Encounter for prophylactic measures, unspecified: Principal | ICD-10-CM

## 2023-06-22 MED ORDER — SULFAMETHOXAZOLE 800 MG-TRIMETHOPRIM 160 MG TABLET
ORAL_TABLET | ORAL | 0 refills | 0.00 days
Start: 2023-06-22 — End: ?

## 2023-06-22 MED ORDER — TAMSULOSIN 0.4 MG CAPSULE
ORAL_CAPSULE | Freq: Every day | ORAL | 3 refills | 90.00 days | Status: CP
Start: 2023-06-22 — End: ?

## 2023-06-24 DIAGNOSIS — Z992 Dependence on renal dialysis: Secondary | ICD-10-CM | POA: Diagnosis not present

## 2023-06-24 DIAGNOSIS — N186 End stage renal disease: Secondary | ICD-10-CM | POA: Diagnosis not present

## 2023-06-27 DIAGNOSIS — Z992 Dependence on renal dialysis: Secondary | ICD-10-CM | POA: Diagnosis not present

## 2023-06-27 DIAGNOSIS — Z79621 Long term (current) use of calcineurin inhibitor: Secondary | ICD-10-CM | POA: Diagnosis not present

## 2023-06-27 DIAGNOSIS — Z5181 Encounter for therapeutic drug level monitoring: Secondary | ICD-10-CM | POA: Diagnosis not present

## 2023-06-27 DIAGNOSIS — N186 End stage renal disease: Secondary | ICD-10-CM | POA: Diagnosis not present

## 2023-06-27 MED ORDER — SULFAMETHOXAZOLE 800 MG-TRIMETHOPRIM 160 MG TABLET
ORAL_TABLET | ORAL | 0 refills | 35.00 days
Start: 2023-06-27 — End: ?

## 2023-06-29 DIAGNOSIS — Z992 Dependence on renal dialysis: Secondary | ICD-10-CM | POA: Diagnosis not present

## 2023-06-29 DIAGNOSIS — N186 End stage renal disease: Secondary | ICD-10-CM | POA: Diagnosis not present

## 2023-06-30 DIAGNOSIS — Z89511 Acquired absence of right leg below knee: Secondary | ICD-10-CM | POA: Diagnosis not present

## 2023-06-30 DIAGNOSIS — Z992 Dependence on renal dialysis: Secondary | ICD-10-CM | POA: Diagnosis not present

## 2023-06-30 DIAGNOSIS — R2689 Other abnormalities of gait and mobility: Secondary | ICD-10-CM | POA: Diagnosis not present

## 2023-06-30 DIAGNOSIS — M549 Dorsalgia, unspecified: Secondary | ICD-10-CM | POA: Diagnosis not present

## 2023-06-30 DIAGNOSIS — Z4781 Encounter for orthopedic aftercare following surgical amputation: Secondary | ICD-10-CM | POA: Diagnosis not present

## 2023-06-30 DIAGNOSIS — Z940 Kidney transplant status: Secondary | ICD-10-CM | POA: Diagnosis not present

## 2023-06-30 DIAGNOSIS — I120 Hypertensive chronic kidney disease with stage 5 chronic kidney disease or end stage renal disease: Secondary | ICD-10-CM | POA: Diagnosis not present

## 2023-06-30 DIAGNOSIS — N186 End stage renal disease: Secondary | ICD-10-CM | POA: Diagnosis not present

## 2023-06-30 DIAGNOSIS — K219 Gastro-esophageal reflux disease without esophagitis: Secondary | ICD-10-CM | POA: Diagnosis not present

## 2023-06-30 DIAGNOSIS — S91102D Unspecified open wound of left great toe without damage to nail, subsequent encounter: Secondary | ICD-10-CM | POA: Diagnosis not present

## 2023-06-30 DIAGNOSIS — E1122 Type 2 diabetes mellitus with diabetic chronic kidney disease: Secondary | ICD-10-CM | POA: Diagnosis not present

## 2023-06-30 DIAGNOSIS — Z7902 Long term (current) use of antithrombotics/antiplatelets: Secondary | ICD-10-CM | POA: Diagnosis not present

## 2023-06-30 DIAGNOSIS — E785 Hyperlipidemia, unspecified: Secondary | ICD-10-CM | POA: Diagnosis not present

## 2023-07-01 DIAGNOSIS — Z992 Dependence on renal dialysis: Secondary | ICD-10-CM | POA: Diagnosis not present

## 2023-07-01 DIAGNOSIS — N186 End stage renal disease: Secondary | ICD-10-CM | POA: Diagnosis not present

## 2023-07-02 DIAGNOSIS — Z992 Dependence on renal dialysis: Secondary | ICD-10-CM | POA: Diagnosis not present

## 2023-07-02 DIAGNOSIS — N186 End stage renal disease: Secondary | ICD-10-CM | POA: Diagnosis not present

## 2023-07-03 DIAGNOSIS — Z48298 Encounter for aftercare following other organ transplant: Principal | ICD-10-CM

## 2023-07-03 DIAGNOSIS — T8611 Kidney transplant rejection: Principal | ICD-10-CM

## 2023-07-04 DIAGNOSIS — N186 End stage renal disease: Secondary | ICD-10-CM | POA: Diagnosis not present

## 2023-07-04 DIAGNOSIS — Z992 Dependence on renal dialysis: Secondary | ICD-10-CM | POA: Diagnosis not present

## 2023-07-05 DIAGNOSIS — R350 Frequency of micturition: Secondary | ICD-10-CM | POA: Diagnosis not present

## 2023-07-05 DIAGNOSIS — N390 Urinary tract infection, site not specified: Secondary | ICD-10-CM | POA: Diagnosis not present

## 2023-07-06 DIAGNOSIS — Z992 Dependence on renal dialysis: Secondary | ICD-10-CM | POA: Diagnosis not present

## 2023-07-06 DIAGNOSIS — N186 End stage renal disease: Secondary | ICD-10-CM | POA: Diagnosis not present

## 2023-07-08 DIAGNOSIS — Z992 Dependence on renal dialysis: Secondary | ICD-10-CM | POA: Diagnosis not present

## 2023-07-08 DIAGNOSIS — N186 End stage renal disease: Secondary | ICD-10-CM | POA: Diagnosis not present

## 2023-07-11 DIAGNOSIS — N186 End stage renal disease: Secondary | ICD-10-CM | POA: Diagnosis not present

## 2023-07-11 DIAGNOSIS — Z992 Dependence on renal dialysis: Secondary | ICD-10-CM | POA: Diagnosis not present

## 2023-07-12 ENCOUNTER — Inpatient Hospital Stay: Admit: 2023-07-12 | Discharge: 2023-07-13 | Payer: MEDICARE

## 2023-07-12 DIAGNOSIS — Z89511 Acquired absence of right leg below knee: Secondary | ICD-10-CM | POA: Diagnosis not present

## 2023-07-12 DIAGNOSIS — E1169 Type 2 diabetes mellitus with other specified complication: Secondary | ICD-10-CM | POA: Diagnosis not present

## 2023-07-12 DIAGNOSIS — E11621 Type 2 diabetes mellitus with foot ulcer: Secondary | ICD-10-CM | POA: Diagnosis not present

## 2023-07-12 DIAGNOSIS — L97523 Non-pressure chronic ulcer of other part of left foot with necrosis of muscle: Secondary | ICD-10-CM | POA: Diagnosis not present

## 2023-07-12 DIAGNOSIS — Z01818 Encounter for other preprocedural examination: Secondary | ICD-10-CM | POA: Diagnosis not present

## 2023-07-12 DIAGNOSIS — E1151 Type 2 diabetes mellitus with diabetic peripheral angiopathy without gangrene: Secondary | ICD-10-CM | POA: Diagnosis not present

## 2023-07-12 DIAGNOSIS — I252 Old myocardial infarction: Secondary | ICD-10-CM | POA: Diagnosis not present

## 2023-07-12 DIAGNOSIS — Z940 Kidney transplant status: Secondary | ICD-10-CM | POA: Diagnosis not present

## 2023-07-12 DIAGNOSIS — I739 Peripheral vascular disease, unspecified: Secondary | ICD-10-CM | POA: Diagnosis not present

## 2023-07-12 DIAGNOSIS — M869 Osteomyelitis, unspecified: Secondary | ICD-10-CM | POA: Diagnosis not present

## 2023-07-12 DIAGNOSIS — Z9862 Peripheral vascular angioplasty status: Secondary | ICD-10-CM | POA: Diagnosis not present

## 2023-07-12 DIAGNOSIS — I7092 Chronic total occlusion of artery of the extremities: Secondary | ICD-10-CM | POA: Diagnosis not present

## 2023-07-12 DIAGNOSIS — I10 Essential (primary) hypertension: Secondary | ICD-10-CM | POA: Diagnosis not present

## 2023-07-12 DIAGNOSIS — E785 Hyperlipidemia, unspecified: Secondary | ICD-10-CM | POA: Diagnosis not present

## 2023-07-12 DIAGNOSIS — Z9889 Other specified postprocedural states: Secondary | ICD-10-CM | POA: Diagnosis not present

## 2023-07-13 ENCOUNTER — Ambulatory Visit: Admit: 2023-07-13 | Discharge: 2023-07-14 | Payer: MEDICARE

## 2023-07-13 ENCOUNTER — Ambulatory Visit
Admit: 2023-07-13 | Discharge: 2023-07-14 | Payer: MEDICARE | Attending: Student in an Organized Health Care Education/Training Program | Primary: Student in an Organized Health Care Education/Training Program

## 2023-07-13 DIAGNOSIS — L97521 Non-pressure chronic ulcer of other part of left foot limited to breakdown of skin: Secondary | ICD-10-CM | POA: Diagnosis not present

## 2023-07-13 DIAGNOSIS — E1151 Type 2 diabetes mellitus with diabetic peripheral angiopathy without gangrene: Secondary | ICD-10-CM | POA: Diagnosis not present

## 2023-07-13 DIAGNOSIS — Z7902 Long term (current) use of antithrombotics/antiplatelets: Secondary | ICD-10-CM | POA: Diagnosis not present

## 2023-07-13 DIAGNOSIS — E1122 Type 2 diabetes mellitus with diabetic chronic kidney disease: Secondary | ICD-10-CM | POA: Diagnosis not present

## 2023-07-13 DIAGNOSIS — N184 Chronic kidney disease, stage 4 (severe): Secondary | ICD-10-CM | POA: Diagnosis not present

## 2023-07-13 DIAGNOSIS — Z95820 Peripheral vascular angioplasty status with implants and grafts: Secondary | ICD-10-CM | POA: Diagnosis not present

## 2023-07-13 DIAGNOSIS — Z89511 Acquired absence of right leg below knee: Secondary | ICD-10-CM | POA: Diagnosis not present

## 2023-07-13 DIAGNOSIS — L97529 Non-pressure chronic ulcer of other part of left foot with unspecified severity: Secondary | ICD-10-CM | POA: Diagnosis not present

## 2023-07-13 DIAGNOSIS — E11621 Type 2 diabetes mellitus with foot ulcer: Secondary | ICD-10-CM | POA: Diagnosis not present

## 2023-07-13 DIAGNOSIS — Z7982 Long term (current) use of aspirin: Secondary | ICD-10-CM | POA: Diagnosis not present

## 2023-07-13 DIAGNOSIS — I70245 Atherosclerosis of native arteries of left leg with ulceration of other part of foot: Secondary | ICD-10-CM | POA: Diagnosis not present

## 2023-07-13 DIAGNOSIS — E785 Hyperlipidemia, unspecified: Secondary | ICD-10-CM | POA: Diagnosis not present

## 2023-07-13 DIAGNOSIS — I130 Hypertensive heart and chronic kidney disease with heart failure and stage 1 through stage 4 chronic kidney disease, or unspecified chronic kidney disease: Secondary | ICD-10-CM | POA: Diagnosis not present

## 2023-07-13 DIAGNOSIS — Z794 Long term (current) use of insulin: Secondary | ICD-10-CM | POA: Diagnosis not present

## 2023-07-13 DIAGNOSIS — S88111D Complete traumatic amputation at level between knee and ankle, right lower leg, subsequent encounter: Secondary | ICD-10-CM | POA: Diagnosis not present

## 2023-07-13 DIAGNOSIS — Z79899 Other long term (current) drug therapy: Secondary | ICD-10-CM | POA: Diagnosis not present

## 2023-07-13 DIAGNOSIS — I5032 Chronic diastolic (congestive) heart failure: Secondary | ICD-10-CM | POA: Diagnosis not present

## 2023-07-13 DIAGNOSIS — L97509 Non-pressure chronic ulcer of other part of unspecified foot with unspecified severity: Principal | ICD-10-CM

## 2023-07-13 DIAGNOSIS — I10 Essential (primary) hypertension: Principal | ICD-10-CM

## 2023-07-13 DIAGNOSIS — Z299 Encounter for prophylactic measures, unspecified: Principal | ICD-10-CM

## 2023-07-13 DIAGNOSIS — L97523 Non-pressure chronic ulcer of other part of left foot with necrosis of muscle: Principal | ICD-10-CM

## 2023-07-13 DIAGNOSIS — I739 Peripheral vascular disease, unspecified: Principal | ICD-10-CM

## 2023-07-13 DIAGNOSIS — T8611 Kidney transplant rejection: Principal | ICD-10-CM

## 2023-07-13 MED ORDER — SULFAMETHOXAZOLE 800 MG-TRIMETHOPRIM 160 MG TABLET
ORAL_TABLET | ORAL | 0 refills | 0.00 days
Start: 2023-07-13 — End: ?

## 2023-07-14 DIAGNOSIS — E785 Hyperlipidemia, unspecified: Secondary | ICD-10-CM | POA: Diagnosis not present

## 2023-07-14 DIAGNOSIS — Z992 Dependence on renal dialysis: Secondary | ICD-10-CM | POA: Diagnosis not present

## 2023-07-14 DIAGNOSIS — N186 End stage renal disease: Secondary | ICD-10-CM | POA: Diagnosis not present

## 2023-07-14 DIAGNOSIS — Z940 Kidney transplant status: Secondary | ICD-10-CM | POA: Diagnosis not present

## 2023-07-14 DIAGNOSIS — E1122 Type 2 diabetes mellitus with diabetic chronic kidney disease: Secondary | ICD-10-CM | POA: Diagnosis not present

## 2023-07-14 DIAGNOSIS — M549 Dorsalgia, unspecified: Secondary | ICD-10-CM | POA: Diagnosis not present

## 2023-07-14 DIAGNOSIS — K219 Gastro-esophageal reflux disease without esophagitis: Secondary | ICD-10-CM | POA: Diagnosis not present

## 2023-07-14 DIAGNOSIS — Z89511 Acquired absence of right leg below knee: Secondary | ICD-10-CM | POA: Diagnosis not present

## 2023-07-14 DIAGNOSIS — Z7902 Long term (current) use of antithrombotics/antiplatelets: Secondary | ICD-10-CM | POA: Diagnosis not present

## 2023-07-14 DIAGNOSIS — I120 Hypertensive chronic kidney disease with stage 5 chronic kidney disease or end stage renal disease: Secondary | ICD-10-CM | POA: Diagnosis not present

## 2023-07-14 DIAGNOSIS — S91102D Unspecified open wound of left great toe without damage to nail, subsequent encounter: Secondary | ICD-10-CM | POA: Diagnosis not present

## 2023-07-14 DIAGNOSIS — Z4781 Encounter for orthopedic aftercare following surgical amputation: Secondary | ICD-10-CM | POA: Diagnosis not present

## 2023-07-15 DIAGNOSIS — N186 End stage renal disease: Secondary | ICD-10-CM | POA: Diagnosis not present

## 2023-07-15 DIAGNOSIS — Z992 Dependence on renal dialysis: Secondary | ICD-10-CM | POA: Diagnosis not present

## 2023-07-15 MED ORDER — SULFAMETHOXAZOLE 800 MG-TRIMETHOPRIM 160 MG TABLET
ORAL_TABLET | ORAL | 0 refills | 35.00 days
Start: 2023-07-15 — End: ?

## 2023-07-18 DIAGNOSIS — N186 End stage renal disease: Secondary | ICD-10-CM | POA: Diagnosis not present

## 2023-07-18 DIAGNOSIS — Z992 Dependence on renal dialysis: Secondary | ICD-10-CM | POA: Diagnosis not present

## 2023-07-18 DIAGNOSIS — T8611 Kidney transplant rejection: Principal | ICD-10-CM

## 2023-07-20 DIAGNOSIS — N186 End stage renal disease: Secondary | ICD-10-CM | POA: Diagnosis not present

## 2023-07-20 DIAGNOSIS — Z992 Dependence on renal dialysis: Secondary | ICD-10-CM | POA: Diagnosis not present

## 2023-07-21 DIAGNOSIS — I120 Hypertensive chronic kidney disease with stage 5 chronic kidney disease or end stage renal disease: Secondary | ICD-10-CM | POA: Diagnosis not present

## 2023-07-21 DIAGNOSIS — M549 Dorsalgia, unspecified: Secondary | ICD-10-CM | POA: Diagnosis not present

## 2023-07-21 DIAGNOSIS — K219 Gastro-esophageal reflux disease without esophagitis: Secondary | ICD-10-CM | POA: Diagnosis not present

## 2023-07-21 DIAGNOSIS — N186 End stage renal disease: Secondary | ICD-10-CM | POA: Diagnosis not present

## 2023-07-21 DIAGNOSIS — Z4781 Encounter for orthopedic aftercare following surgical amputation: Secondary | ICD-10-CM | POA: Diagnosis not present

## 2023-07-21 DIAGNOSIS — Z940 Kidney transplant status: Secondary | ICD-10-CM | POA: Diagnosis not present

## 2023-07-21 DIAGNOSIS — Z7902 Long term (current) use of antithrombotics/antiplatelets: Secondary | ICD-10-CM | POA: Diagnosis not present

## 2023-07-21 DIAGNOSIS — Z992 Dependence on renal dialysis: Secondary | ICD-10-CM | POA: Diagnosis not present

## 2023-07-21 DIAGNOSIS — Z89511 Acquired absence of right leg below knee: Secondary | ICD-10-CM | POA: Diagnosis not present

## 2023-07-21 DIAGNOSIS — E1122 Type 2 diabetes mellitus with diabetic chronic kidney disease: Secondary | ICD-10-CM | POA: Diagnosis not present

## 2023-07-21 DIAGNOSIS — E785 Hyperlipidemia, unspecified: Secondary | ICD-10-CM | POA: Diagnosis not present

## 2023-07-21 DIAGNOSIS — S91102D Unspecified open wound of left great toe without damage to nail, subsequent encounter: Secondary | ICD-10-CM | POA: Diagnosis not present

## 2023-07-22 DIAGNOSIS — Z992 Dependence on renal dialysis: Secondary | ICD-10-CM | POA: Diagnosis not present

## 2023-07-22 DIAGNOSIS — N186 End stage renal disease: Secondary | ICD-10-CM | POA: Diagnosis not present

## 2023-07-25 DIAGNOSIS — Z5181 Encounter for therapeutic drug level monitoring: Secondary | ICD-10-CM | POA: Diagnosis not present

## 2023-07-25 DIAGNOSIS — N186 End stage renal disease: Secondary | ICD-10-CM | POA: Diagnosis not present

## 2023-07-25 DIAGNOSIS — Z992 Dependence on renal dialysis: Secondary | ICD-10-CM | POA: Diagnosis not present

## 2023-07-25 DIAGNOSIS — Z79621 Long term (current) use of calcineurin inhibitor: Secondary | ICD-10-CM | POA: Diagnosis not present

## 2023-07-27 DIAGNOSIS — Z992 Dependence on renal dialysis: Secondary | ICD-10-CM | POA: Diagnosis not present

## 2023-07-27 DIAGNOSIS — N186 End stage renal disease: Secondary | ICD-10-CM | POA: Diagnosis not present

## 2023-07-28 DIAGNOSIS — R2689 Other abnormalities of gait and mobility: Secondary | ICD-10-CM | POA: Diagnosis not present

## 2023-07-29 DIAGNOSIS — Z992 Dependence on renal dialysis: Secondary | ICD-10-CM | POA: Diagnosis not present

## 2023-07-29 DIAGNOSIS — N186 End stage renal disease: Secondary | ICD-10-CM | POA: Diagnosis not present

## 2023-08-01 DIAGNOSIS — Z992 Dependence on renal dialysis: Secondary | ICD-10-CM | POA: Diagnosis not present

## 2023-08-01 DIAGNOSIS — N186 End stage renal disease: Secondary | ICD-10-CM | POA: Diagnosis not present

## 2023-08-03 ENCOUNTER — Ambulatory Visit: Admit: 2023-08-03 | Discharge: 2023-08-04 | Attending: Foot & Ankle Surgery | Primary: Foot & Ankle Surgery

## 2023-08-03 DIAGNOSIS — E1151 Type 2 diabetes mellitus with diabetic peripheral angiopathy without gangrene: Secondary | ICD-10-CM | POA: Diagnosis not present

## 2023-08-03 DIAGNOSIS — E1169 Type 2 diabetes mellitus with other specified complication: Secondary | ICD-10-CM | POA: Diagnosis not present

## 2023-08-03 DIAGNOSIS — L97522 Non-pressure chronic ulcer of other part of left foot with fat layer exposed: Secondary | ICD-10-CM | POA: Diagnosis not present

## 2023-08-03 DIAGNOSIS — E7800 Pure hypercholesterolemia, unspecified: Secondary | ICD-10-CM | POA: Diagnosis not present

## 2023-08-03 DIAGNOSIS — K219 Gastro-esophageal reflux disease without esophagitis: Secondary | ICD-10-CM | POA: Diagnosis not present

## 2023-08-03 DIAGNOSIS — E1122 Type 2 diabetes mellitus with diabetic chronic kidney disease: Secondary | ICD-10-CM | POA: Diagnosis not present

## 2023-08-03 DIAGNOSIS — E11621 Type 2 diabetes mellitus with foot ulcer: Secondary | ICD-10-CM | POA: Diagnosis not present

## 2023-08-03 DIAGNOSIS — I130 Hypertensive heart and chronic kidney disease with heart failure and stage 1 through stage 4 chronic kidney disease, or unspecified chronic kidney disease: Secondary | ICD-10-CM | POA: Diagnosis not present

## 2023-08-03 DIAGNOSIS — N184 Chronic kidney disease, stage 4 (severe): Secondary | ICD-10-CM | POA: Diagnosis not present

## 2023-08-03 DIAGNOSIS — I5032 Chronic diastolic (congestive) heart failure: Secondary | ICD-10-CM | POA: Diagnosis not present

## 2023-08-03 DIAGNOSIS — M869 Osteomyelitis, unspecified: Secondary | ICD-10-CM | POA: Diagnosis not present

## 2023-08-03 DIAGNOSIS — I70245 Atherosclerosis of native arteries of left leg with ulceration of other part of foot: Secondary | ICD-10-CM | POA: Diagnosis not present

## 2023-08-03 DIAGNOSIS — I739 Peripheral vascular disease, unspecified: Principal | ICD-10-CM

## 2023-08-05 DIAGNOSIS — N186 End stage renal disease: Secondary | ICD-10-CM | POA: Diagnosis not present

## 2023-08-05 DIAGNOSIS — Z992 Dependence on renal dialysis: Secondary | ICD-10-CM | POA: Diagnosis not present

## 2023-08-07 DIAGNOSIS — T8611 Kidney transplant rejection: Principal | ICD-10-CM

## 2023-08-07 DIAGNOSIS — Z48298 Encounter for aftercare following other organ transplant: Principal | ICD-10-CM

## 2023-08-08 DIAGNOSIS — N186 End stage renal disease: Secondary | ICD-10-CM | POA: Diagnosis not present

## 2023-08-08 DIAGNOSIS — Z992 Dependence on renal dialysis: Secondary | ICD-10-CM | POA: Diagnosis not present

## 2023-08-10 DIAGNOSIS — Z992 Dependence on renal dialysis: Secondary | ICD-10-CM | POA: Diagnosis not present

## 2023-08-10 DIAGNOSIS — N186 End stage renal disease: Secondary | ICD-10-CM | POA: Diagnosis not present

## 2023-08-11 DIAGNOSIS — T8611 Kidney transplant rejection: Principal | ICD-10-CM

## 2023-08-12 DIAGNOSIS — N186 End stage renal disease: Secondary | ICD-10-CM | POA: Diagnosis not present

## 2023-08-12 DIAGNOSIS — Z992 Dependence on renal dialysis: Secondary | ICD-10-CM | POA: Diagnosis not present

## 2023-08-12 DIAGNOSIS — T8611 Kidney transplant rejection: Principal | ICD-10-CM

## 2023-08-15 DIAGNOSIS — Z992 Dependence on renal dialysis: Secondary | ICD-10-CM | POA: Diagnosis not present

## 2023-08-15 DIAGNOSIS — N186 End stage renal disease: Secondary | ICD-10-CM | POA: Diagnosis not present

## 2023-08-17 DIAGNOSIS — Z992 Dependence on renal dialysis: Secondary | ICD-10-CM | POA: Diagnosis not present

## 2023-08-17 DIAGNOSIS — N186 End stage renal disease: Secondary | ICD-10-CM | POA: Diagnosis not present

## 2023-08-19 DIAGNOSIS — Z992 Dependence on renal dialysis: Secondary | ICD-10-CM | POA: Diagnosis not present

## 2023-08-19 DIAGNOSIS — N186 End stage renal disease: Secondary | ICD-10-CM | POA: Diagnosis not present

## 2023-08-22 DIAGNOSIS — Z992 Dependence on renal dialysis: Secondary | ICD-10-CM | POA: Diagnosis not present

## 2023-08-22 DIAGNOSIS — N186 End stage renal disease: Secondary | ICD-10-CM | POA: Diagnosis not present

## 2023-08-23 ENCOUNTER — Ambulatory Visit: Admit: 2023-08-23 | Discharge: 2023-08-23 | Payer: Medicare (Managed Care)

## 2023-08-23 DIAGNOSIS — I739 Peripheral vascular disease, unspecified: Principal | ICD-10-CM

## 2023-08-23 DIAGNOSIS — L97509 Non-pressure chronic ulcer of other part of unspecified foot with unspecified severity: Principal | ICD-10-CM

## 2023-08-23 DIAGNOSIS — M869 Osteomyelitis, unspecified: Principal | ICD-10-CM

## 2023-08-23 DIAGNOSIS — E11621 Type 2 diabetes mellitus with foot ulcer: Principal | ICD-10-CM

## 2023-08-24 ENCOUNTER — Emergency Department
Admit: 2023-08-24 | Discharge: 2023-08-24 | Disposition: A | Discharge status: Home / Self Care | Payer: Medicare (Managed Care)

## 2023-08-24 DIAGNOSIS — Z9862 Peripheral vascular angioplasty status: Secondary | ICD-10-CM | POA: Diagnosis not present

## 2023-08-24 DIAGNOSIS — I739 Peripheral vascular disease, unspecified: Secondary | ICD-10-CM | POA: Diagnosis not present

## 2023-08-24 DIAGNOSIS — I130 Hypertensive heart and chronic kidney disease with heart failure and stage 1 through stage 4 chronic kidney disease, or unspecified chronic kidney disease: Secondary | ICD-10-CM | POA: Diagnosis not present

## 2023-08-24 DIAGNOSIS — M869 Osteomyelitis, unspecified: Secondary | ICD-10-CM | POA: Diagnosis not present

## 2023-08-24 DIAGNOSIS — S91102A Unspecified open wound of left great toe without damage to nail, initial encounter: Secondary | ICD-10-CM | POA: Diagnosis not present

## 2023-08-24 DIAGNOSIS — E1151 Type 2 diabetes mellitus with diabetic peripheral angiopathy without gangrene: Secondary | ICD-10-CM | POA: Diagnosis not present

## 2023-08-24 DIAGNOSIS — M79672 Pain in left foot: Secondary | ICD-10-CM | POA: Diagnosis not present

## 2023-08-24 DIAGNOSIS — I70209 Unspecified atherosclerosis of native arteries of extremities, unspecified extremity: Secondary | ICD-10-CM | POA: Diagnosis not present

## 2023-08-24 DIAGNOSIS — X58XXXA Exposure to other specified factors, initial encounter: Secondary | ICD-10-CM | POA: Diagnosis not present

## 2023-08-24 DIAGNOSIS — Z79899 Other long term (current) drug therapy: Secondary | ICD-10-CM | POA: Diagnosis not present

## 2023-08-24 DIAGNOSIS — E119 Type 2 diabetes mellitus without complications: Secondary | ICD-10-CM | POA: Diagnosis not present

## 2023-08-24 DIAGNOSIS — I129 Hypertensive chronic kidney disease with stage 1 through stage 4 chronic kidney disease, or unspecified chronic kidney disease: Secondary | ICD-10-CM | POA: Diagnosis not present

## 2023-08-24 DIAGNOSIS — E1122 Type 2 diabetes mellitus with diabetic chronic kidney disease: Secondary | ICD-10-CM | POA: Diagnosis not present

## 2023-08-24 DIAGNOSIS — N189 Chronic kidney disease, unspecified: Secondary | ICD-10-CM | POA: Diagnosis not present

## 2023-08-24 DIAGNOSIS — E1136 Type 2 diabetes mellitus with diabetic cataract: Secondary | ICD-10-CM | POA: Diagnosis not present

## 2023-08-24 DIAGNOSIS — I5032 Chronic diastolic (congestive) heart failure: Secondary | ICD-10-CM | POA: Diagnosis not present

## 2023-08-24 DIAGNOSIS — Z794 Long term (current) use of insulin: Secondary | ICD-10-CM | POA: Diagnosis not present

## 2023-08-24 DIAGNOSIS — E7800 Pure hypercholesterolemia, unspecified: Secondary | ICD-10-CM | POA: Diagnosis not present

## 2023-08-24 DIAGNOSIS — E1169 Type 2 diabetes mellitus with other specified complication: Secondary | ICD-10-CM | POA: Diagnosis not present

## 2023-08-24 DIAGNOSIS — T8611 Kidney transplant rejection: Principal | ICD-10-CM

## 2023-08-25 ENCOUNTER — Ambulatory Visit
Admit: 2023-08-25 | Payer: Medicare (Managed Care) | Attending: Student in an Organized Health Care Education/Training Program | Primary: Student in an Organized Health Care Education/Training Program

## 2023-08-26 DIAGNOSIS — Z992 Dependence on renal dialysis: Secondary | ICD-10-CM | POA: Diagnosis not present

## 2023-08-26 DIAGNOSIS — N186 End stage renal disease: Secondary | ICD-10-CM | POA: Diagnosis not present

## 2023-08-26 DIAGNOSIS — M869 Osteomyelitis, unspecified: Principal | ICD-10-CM

## 2023-08-26 DIAGNOSIS — E11621 Type 2 diabetes mellitus with foot ulcer: Principal | ICD-10-CM

## 2023-08-26 DIAGNOSIS — I739 Peripheral vascular disease, unspecified: Principal | ICD-10-CM

## 2023-08-26 DIAGNOSIS — L97509 Non-pressure chronic ulcer of other part of unspecified foot with unspecified severity: Principal | ICD-10-CM

## 2023-08-28 DIAGNOSIS — R2689 Other abnormalities of gait and mobility: Secondary | ICD-10-CM | POA: Diagnosis not present

## 2023-08-29 DIAGNOSIS — N186 End stage renal disease: Secondary | ICD-10-CM | POA: Diagnosis not present

## 2023-08-29 DIAGNOSIS — Z992 Dependence on renal dialysis: Secondary | ICD-10-CM | POA: Diagnosis not present

## 2023-08-29 MED ORDER — VANCOMYCIN 750 MG INTRAVENOUS SOLUTION
INTRAVENOUS | 0 refills | 0.00000 days | Status: CP
Start: 2023-08-29 — End: 2023-08-26

## 2023-08-29 MED ORDER — CEFEPIME 1 GRAM SOLUTION FOR INJECTION
INTRAMUSCULAR | 0 refills | 0.00000 days | Status: CP
Start: 2023-08-29 — End: 2023-08-26

## 2023-08-31 ENCOUNTER — Ambulatory Visit
Admit: 2023-08-31 | Discharge: 2023-09-01 | Payer: Medicare (Managed Care) | Attending: Student in an Organized Health Care Education/Training Program | Primary: Student in an Organized Health Care Education/Training Program

## 2023-08-31 ENCOUNTER — Inpatient Hospital Stay: Admit: 2023-08-31 | Discharge: 2023-09-01 | Payer: Medicare (Managed Care)

## 2023-08-31 DIAGNOSIS — Z992 Dependence on renal dialysis: Secondary | ICD-10-CM | POA: Diagnosis not present

## 2023-08-31 DIAGNOSIS — Z0389 Encounter for observation for other suspected diseases and conditions ruled out: Secondary | ICD-10-CM | POA: Diagnosis not present

## 2023-08-31 DIAGNOSIS — N186 End stage renal disease: Secondary | ICD-10-CM | POA: Diagnosis not present

## 2023-08-31 DIAGNOSIS — I739 Peripheral vascular disease, unspecified: Secondary | ICD-10-CM | POA: Diagnosis not present

## 2023-09-01 DIAGNOSIS — I739 Peripheral vascular disease, unspecified: Principal | ICD-10-CM

## 2023-09-04 DIAGNOSIS — T8611 Kidney transplant rejection: Principal | ICD-10-CM

## 2023-09-04 DIAGNOSIS — Z48298 Encounter for aftercare following other organ transplant: Principal | ICD-10-CM

## 2023-09-06 DIAGNOSIS — T8611 Kidney transplant rejection: Principal | ICD-10-CM

## 2023-09-07 ENCOUNTER — Inpatient Hospital Stay: Admit: 2023-09-07 | Discharge: 2023-09-08 | Payer: Medicare (Managed Care)

## 2023-09-07 DIAGNOSIS — L089 Local infection of the skin and subcutaneous tissue, unspecified: Secondary | ICD-10-CM | POA: Diagnosis not present

## 2023-09-07 DIAGNOSIS — M868X7 Other osteomyelitis, ankle and foot: Secondary | ICD-10-CM | POA: Diagnosis not present

## 2023-09-07 DIAGNOSIS — M869 Osteomyelitis, unspecified: Secondary | ICD-10-CM | POA: Diagnosis not present

## 2023-09-07 DIAGNOSIS — I739 Peripheral vascular disease, unspecified: Secondary | ICD-10-CM | POA: Diagnosis not present

## 2023-09-09 DIAGNOSIS — Z992 Dependence on renal dialysis: Secondary | ICD-10-CM | POA: Diagnosis not present

## 2023-09-09 DIAGNOSIS — E1122 Type 2 diabetes mellitus with diabetic chronic kidney disease: Secondary | ICD-10-CM | POA: Diagnosis not present

## 2023-09-09 DIAGNOSIS — N186 End stage renal disease: Secondary | ICD-10-CM | POA: Diagnosis not present

## 2023-09-14 DIAGNOSIS — N186 End stage renal disease: Secondary | ICD-10-CM | POA: Diagnosis not present

## 2023-09-14 DIAGNOSIS — Z79621 Long term (current) use of calcineurin inhibitor: Secondary | ICD-10-CM | POA: Diagnosis not present

## 2023-09-14 DIAGNOSIS — Z5181 Encounter for therapeutic drug level monitoring: Secondary | ICD-10-CM | POA: Diagnosis not present

## 2023-09-14 DIAGNOSIS — Z992 Dependence on renal dialysis: Secondary | ICD-10-CM | POA: Diagnosis not present

## 2023-09-14 DIAGNOSIS — T8611 Kidney transplant rejection: Principal | ICD-10-CM

## 2023-09-15 ENCOUNTER — Encounter
Admit: 2023-09-15 | Discharge: 2023-09-15 | Payer: Medicare (Managed Care) | Attending: Student in an Organized Health Care Education/Training Program | Primary: Student in an Organized Health Care Education/Training Program

## 2023-09-15 ENCOUNTER — Inpatient Hospital Stay: Admit: 2023-09-15 | Discharge: 2023-09-15 | Payer: Medicare (Managed Care)

## 2023-09-15 ENCOUNTER — Ambulatory Visit: Admit: 2023-09-15 | Discharge: 2023-09-15 | Payer: Medicare (Managed Care)

## 2023-09-15 DIAGNOSIS — I96 Gangrene, not elsewhere classified: Secondary | ICD-10-CM | POA: Diagnosis not present

## 2023-09-15 DIAGNOSIS — M869 Osteomyelitis, unspecified: Secondary | ICD-10-CM | POA: Diagnosis not present

## 2023-09-15 DIAGNOSIS — Z89412 Acquired absence of left great toe: Secondary | ICD-10-CM | POA: Diagnosis not present

## 2023-09-15 MED ORDER — OXYCODONE 5 MG TABLET
ORAL_TABLET | Freq: Four times a day (QID) | ORAL | 0 refills | 5.00000 days | Status: CP | PRN
Start: 2023-09-15 — End: 2023-09-20
  Filled 2023-09-15: qty 20, 5d supply, fill #0

## 2023-09-20 DIAGNOSIS — T8611 Kidney transplant rejection: Principal | ICD-10-CM

## 2023-09-22 ENCOUNTER — Ambulatory Visit: Admit: 2023-09-22 | Discharge: 2023-09-22 | Payer: Medicare (Managed Care)

## 2023-09-22 DIAGNOSIS — E11319 Type 2 diabetes mellitus with unspecified diabetic retinopathy without macular edema: Secondary | ICD-10-CM | POA: Diagnosis not present

## 2023-09-22 DIAGNOSIS — I739 Peripheral vascular disease, unspecified: Secondary | ICD-10-CM | POA: Diagnosis not present

## 2023-09-22 DIAGNOSIS — G459 Transient cerebral ischemic attack, unspecified: Secondary | ICD-10-CM | POA: Diagnosis not present

## 2023-09-22 DIAGNOSIS — E1142 Type 2 diabetes mellitus with diabetic polyneuropathy: Secondary | ICD-10-CM | POA: Diagnosis not present

## 2023-09-22 DIAGNOSIS — I10 Essential (primary) hypertension: Secondary | ICD-10-CM | POA: Diagnosis not present

## 2023-09-22 DIAGNOSIS — I5020 Unspecified systolic (congestive) heart failure: Secondary | ICD-10-CM | POA: Diagnosis not present

## 2023-09-22 DIAGNOSIS — I2510 Atherosclerotic heart disease of native coronary artery without angina pectoris: Secondary | ICD-10-CM | POA: Diagnosis not present

## 2023-09-22 DIAGNOSIS — K219 Gastro-esophageal reflux disease without esophagitis: Secondary | ICD-10-CM | POA: Diagnosis not present

## 2023-09-22 DIAGNOSIS — N189 Chronic kidney disease, unspecified: Secondary | ICD-10-CM | POA: Diagnosis not present

## 2023-09-22 DIAGNOSIS — N186 End stage renal disease: Secondary | ICD-10-CM | POA: Diagnosis not present

## 2023-09-22 DIAGNOSIS — Z862 Personal history of diseases of the blood and blood-forming organs and certain disorders involving the immune mechanism: Secondary | ICD-10-CM | POA: Diagnosis not present

## 2023-09-26 DIAGNOSIS — I5020 Unspecified systolic (congestive) heart failure: Secondary | ICD-10-CM | POA: Diagnosis not present

## 2023-09-26 DIAGNOSIS — I2510 Atherosclerotic heart disease of native coronary artery without angina pectoris: Secondary | ICD-10-CM | POA: Diagnosis not present

## 2023-09-26 DIAGNOSIS — I10 Essential (primary) hypertension: Secondary | ICD-10-CM | POA: Diagnosis not present

## 2023-09-27 DIAGNOSIS — M549 Dorsalgia, unspecified: Secondary | ICD-10-CM | POA: Diagnosis not present

## 2023-09-27 DIAGNOSIS — N186 End stage renal disease: Secondary | ICD-10-CM | POA: Diagnosis not present

## 2023-09-27 DIAGNOSIS — Z89511 Acquired absence of right leg below knee: Secondary | ICD-10-CM | POA: Diagnosis not present

## 2023-09-27 DIAGNOSIS — Z992 Dependence on renal dialysis: Secondary | ICD-10-CM | POA: Diagnosis not present

## 2023-09-27 DIAGNOSIS — E785 Hyperlipidemia, unspecified: Secondary | ICD-10-CM | POA: Diagnosis not present

## 2023-09-27 DIAGNOSIS — S91102D Unspecified open wound of left great toe without damage to nail, subsequent encounter: Secondary | ICD-10-CM | POA: Diagnosis not present

## 2023-09-27 DIAGNOSIS — Z4781 Encounter for orthopedic aftercare following surgical amputation: Secondary | ICD-10-CM | POA: Diagnosis not present

## 2023-09-27 DIAGNOSIS — Z7902 Long term (current) use of antithrombotics/antiplatelets: Secondary | ICD-10-CM | POA: Diagnosis not present

## 2023-09-27 DIAGNOSIS — R2689 Other abnormalities of gait and mobility: Secondary | ICD-10-CM | POA: Diagnosis not present

## 2023-09-27 DIAGNOSIS — K219 Gastro-esophageal reflux disease without esophagitis: Secondary | ICD-10-CM | POA: Diagnosis not present

## 2023-09-27 DIAGNOSIS — Z940 Kidney transplant status: Secondary | ICD-10-CM | POA: Diagnosis not present

## 2023-09-27 DIAGNOSIS — E1122 Type 2 diabetes mellitus with diabetic chronic kidney disease: Secondary | ICD-10-CM | POA: Diagnosis not present

## 2023-09-27 DIAGNOSIS — I120 Hypertensive chronic kidney disease with stage 5 chronic kidney disease or end stage renal disease: Secondary | ICD-10-CM | POA: Diagnosis not present

## 2023-09-27 MED ORDER — FUROSEMIDE 80 MG TABLET
ORAL_TABLET | Freq: Every day | ORAL | 3 refills | 90.00000 days | Status: CP
Start: 2023-09-27 — End: ?

## 2023-09-28 ENCOUNTER — Ambulatory Visit: Admit: 2023-09-28 | Discharge: 2023-10-06 | Payer: Medicare (Managed Care)

## 2023-09-28 ENCOUNTER — Ambulatory Visit
Admit: 2023-09-28 | Discharge: 2023-10-06 | Disposition: A | Discharge status: Skilled Nursing Facility | Payer: Medicare (Managed Care) | Admitting: Student in an Organized Health Care Education/Training Program

## 2023-09-28 ENCOUNTER — Encounter: Admit: 2023-09-28 | Discharge: 2023-10-06 | Payer: Medicare (Managed Care)

## 2023-09-28 ENCOUNTER — Ambulatory Visit
Admit: 2023-09-28 | Discharge: 2023-09-29 | Payer: Medicare (Managed Care) | Attending: Foot & Ankle Surgery | Primary: Foot & Ankle Surgery

## 2023-09-28 ENCOUNTER — Ambulatory Visit: Admit: 2023-09-28 | Payer: Medicare (Managed Care)

## 2023-09-28 ENCOUNTER — Encounter: Admit: 2023-09-28 | Payer: Medicare (Managed Care)

## 2023-09-28 ENCOUNTER — Inpatient Hospital Stay
Admit: 2023-09-28 | Discharge: 2023-10-06 | Disposition: A | Discharge status: Skilled Nursing Facility | Payer: Medicare (Managed Care) | Admitting: Student in an Organized Health Care Education/Training Program

## 2023-09-28 ENCOUNTER — Ambulatory Visit: Admit: 2023-09-28 | Discharge: 2023-09-29 | Payer: Medicare (Managed Care)

## 2023-09-28 DIAGNOSIS — Z992 Dependence on renal dialysis: Secondary | ICD-10-CM | POA: Diagnosis not present

## 2023-09-28 DIAGNOSIS — Z7982 Long term (current) use of aspirin: Secondary | ICD-10-CM | POA: Diagnosis not present

## 2023-09-28 DIAGNOSIS — I132 Hypertensive heart and chronic kidney disease with heart failure and with stage 5 chronic kidney disease, or end stage renal disease: Secondary | ICD-10-CM | POA: Diagnosis not present

## 2023-09-28 DIAGNOSIS — I739 Peripheral vascular disease, unspecified: Secondary | ICD-10-CM | POA: Diagnosis not present

## 2023-09-28 DIAGNOSIS — I2510 Atherosclerotic heart disease of native coronary artery without angina pectoris: Secondary | ICD-10-CM | POA: Diagnosis not present

## 2023-09-28 DIAGNOSIS — E1122 Type 2 diabetes mellitus with diabetic chronic kidney disease: Secondary | ICD-10-CM | POA: Diagnosis not present

## 2023-09-28 DIAGNOSIS — Z89511 Acquired absence of right leg below knee: Secondary | ICD-10-CM | POA: Diagnosis not present

## 2023-09-28 DIAGNOSIS — N186 End stage renal disease: Secondary | ICD-10-CM | POA: Diagnosis not present

## 2023-09-28 DIAGNOSIS — I120 Hypertensive chronic kidney disease with stage 5 chronic kidney disease or end stage renal disease: Secondary | ICD-10-CM | POA: Diagnosis not present

## 2023-09-28 DIAGNOSIS — M85872 Other specified disorders of bone density and structure, left ankle and foot: Secondary | ICD-10-CM | POA: Diagnosis not present

## 2023-09-28 DIAGNOSIS — T8131XA Disruption of external operation (surgical) wound, not elsewhere classified, initial encounter: Secondary | ICD-10-CM | POA: Diagnosis not present

## 2023-09-28 DIAGNOSIS — I5022 Chronic systolic (congestive) heart failure: Secondary | ICD-10-CM | POA: Diagnosis not present

## 2023-09-28 DIAGNOSIS — Z89412 Acquired absence of left great toe: Secondary | ICD-10-CM | POA: Diagnosis not present

## 2023-09-28 DIAGNOSIS — S91109A Unspecified open wound of unspecified toe(s) without damage to nail, initial encounter: Secondary | ICD-10-CM | POA: Diagnosis not present

## 2023-09-28 DIAGNOSIS — Z7902 Long term (current) use of antithrombotics/antiplatelets: Secondary | ICD-10-CM | POA: Diagnosis not present

## 2023-09-28 DIAGNOSIS — E11621 Type 2 diabetes mellitus with foot ulcer: Principal | ICD-10-CM

## 2023-09-28 DIAGNOSIS — T8130XA Disruption of wound, unspecified, initial encounter: Principal | ICD-10-CM

## 2023-09-28 DIAGNOSIS — L97522 Non-pressure chronic ulcer of other part of left foot with fat layer exposed: Principal | ICD-10-CM

## 2023-09-28 DIAGNOSIS — M869 Osteomyelitis, unspecified: Principal | ICD-10-CM

## 2023-09-29 DIAGNOSIS — E1122 Type 2 diabetes mellitus with diabetic chronic kidney disease: Secondary | ICD-10-CM | POA: Diagnosis not present

## 2023-09-29 DIAGNOSIS — Z794 Long term (current) use of insulin: Secondary | ICD-10-CM | POA: Diagnosis not present

## 2023-09-29 DIAGNOSIS — E1151 Type 2 diabetes mellitus with diabetic peripheral angiopathy without gangrene: Secondary | ICD-10-CM | POA: Diagnosis not present

## 2023-09-29 DIAGNOSIS — N186 End stage renal disease: Secondary | ICD-10-CM | POA: Diagnosis not present

## 2023-09-29 DIAGNOSIS — B999 Unspecified infectious disease: Secondary | ICD-10-CM | POA: Diagnosis not present

## 2023-09-29 DIAGNOSIS — E1165 Type 2 diabetes mellitus with hyperglycemia: Secondary | ICD-10-CM | POA: Diagnosis not present

## 2023-09-30 DIAGNOSIS — I509 Heart failure, unspecified: Secondary | ICD-10-CM | POA: Diagnosis not present

## 2023-09-30 DIAGNOSIS — I70222 Atherosclerosis of native arteries of extremities with rest pain, left leg: Secondary | ICD-10-CM | POA: Diagnosis not present

## 2023-09-30 DIAGNOSIS — N186 End stage renal disease: Secondary | ICD-10-CM | POA: Diagnosis not present

## 2023-09-30 DIAGNOSIS — Z794 Long term (current) use of insulin: Secondary | ICD-10-CM | POA: Diagnosis not present

## 2023-09-30 DIAGNOSIS — G8918 Other acute postprocedural pain: Secondary | ICD-10-CM | POA: Diagnosis not present

## 2023-09-30 DIAGNOSIS — M869 Osteomyelitis, unspecified: Secondary | ICD-10-CM | POA: Diagnosis not present

## 2023-09-30 DIAGNOSIS — E1122 Type 2 diabetes mellitus with diabetic chronic kidney disease: Secondary | ICD-10-CM | POA: Diagnosis not present

## 2023-09-30 DIAGNOSIS — E1151 Type 2 diabetes mellitus with diabetic peripheral angiopathy without gangrene: Secondary | ICD-10-CM | POA: Diagnosis not present

## 2023-09-30 DIAGNOSIS — T8130XA Disruption of wound, unspecified, initial encounter: Secondary | ICD-10-CM | POA: Diagnosis not present

## 2023-09-30 DIAGNOSIS — B999 Unspecified infectious disease: Secondary | ICD-10-CM | POA: Diagnosis not present

## 2023-09-30 DIAGNOSIS — E875 Hyperkalemia: Secondary | ICD-10-CM | POA: Diagnosis not present

## 2023-09-30 DIAGNOSIS — E1165 Type 2 diabetes mellitus with hyperglycemia: Secondary | ICD-10-CM | POA: Diagnosis not present

## 2023-09-30 DIAGNOSIS — Z89512 Acquired absence of left leg below knee: Principal | ICD-10-CM

## 2023-09-30 DIAGNOSIS — Z89511 Acquired absence of right leg below knee: Principal | ICD-10-CM

## 2023-09-30 DIAGNOSIS — T8611 Kidney transplant rejection: Principal | ICD-10-CM

## 2023-10-01 DIAGNOSIS — D631 Anemia in chronic kidney disease: Secondary | ICD-10-CM | POA: Diagnosis not present

## 2023-10-01 DIAGNOSIS — N186 End stage renal disease: Secondary | ICD-10-CM | POA: Diagnosis not present

## 2023-10-01 DIAGNOSIS — Z992 Dependence on renal dialysis: Secondary | ICD-10-CM | POA: Diagnosis not present

## 2023-10-02 DIAGNOSIS — Z48298 Encounter for aftercare following other organ transplant: Principal | ICD-10-CM

## 2023-10-02 DIAGNOSIS — T8611 Kidney transplant rejection: Principal | ICD-10-CM

## 2023-10-03 DIAGNOSIS — Z89512 Acquired absence of left leg below knee: Secondary | ICD-10-CM | POA: Diagnosis not present

## 2023-10-03 DIAGNOSIS — Z794 Long term (current) use of insulin: Secondary | ICD-10-CM | POA: Diagnosis not present

## 2023-10-03 DIAGNOSIS — Z79899 Other long term (current) drug therapy: Secondary | ICD-10-CM | POA: Diagnosis not present

## 2023-10-03 DIAGNOSIS — I509 Heart failure, unspecified: Secondary | ICD-10-CM | POA: Diagnosis not present

## 2023-10-03 DIAGNOSIS — Z7902 Long term (current) use of antithrombotics/antiplatelets: Secondary | ICD-10-CM | POA: Diagnosis not present

## 2023-10-03 DIAGNOSIS — D631 Anemia in chronic kidney disease: Secondary | ICD-10-CM | POA: Diagnosis not present

## 2023-10-03 DIAGNOSIS — Z79891 Long term (current) use of opiate analgesic: Secondary | ICD-10-CM | POA: Diagnosis not present

## 2023-10-03 DIAGNOSIS — E1165 Type 2 diabetes mellitus with hyperglycemia: Secondary | ICD-10-CM | POA: Diagnosis not present

## 2023-10-03 DIAGNOSIS — E875 Hyperkalemia: Secondary | ICD-10-CM | POA: Diagnosis not present

## 2023-10-03 DIAGNOSIS — N186 End stage renal disease: Secondary | ICD-10-CM | POA: Diagnosis not present

## 2023-10-03 DIAGNOSIS — B999 Unspecified infectious disease: Secondary | ICD-10-CM | POA: Diagnosis not present

## 2023-10-03 DIAGNOSIS — E119 Type 2 diabetes mellitus without complications: Secondary | ICD-10-CM | POA: Diagnosis not present

## 2023-10-03 DIAGNOSIS — Z7982 Long term (current) use of aspirin: Secondary | ICD-10-CM | POA: Diagnosis not present

## 2023-10-03 DIAGNOSIS — E1122 Type 2 diabetes mellitus with diabetic chronic kidney disease: Secondary | ICD-10-CM | POA: Diagnosis not present

## 2023-10-03 DIAGNOSIS — Z89511 Acquired absence of right leg below knee: Secondary | ICD-10-CM | POA: Diagnosis not present

## 2023-10-03 DIAGNOSIS — Z992 Dependence on renal dialysis: Secondary | ICD-10-CM | POA: Diagnosis not present

## 2023-10-03 DIAGNOSIS — E1151 Type 2 diabetes mellitus with diabetic peripheral angiopathy without gangrene: Secondary | ICD-10-CM | POA: Diagnosis not present

## 2023-10-03 DIAGNOSIS — I2510 Atherosclerotic heart disease of native coronary artery without angina pectoris: Secondary | ICD-10-CM | POA: Diagnosis not present

## 2023-10-03 DIAGNOSIS — I5020 Unspecified systolic (congestive) heart failure: Secondary | ICD-10-CM | POA: Diagnosis not present

## 2023-10-03 DIAGNOSIS — Z940 Kidney transplant status: Secondary | ICD-10-CM | POA: Diagnosis not present

## 2023-10-04 ENCOUNTER — Ambulatory Visit: Admit: 2023-10-04 | Payer: Medicare (Managed Care)

## 2023-10-04 DIAGNOSIS — B999 Unspecified infectious disease: Secondary | ICD-10-CM | POA: Diagnosis not present

## 2023-10-04 DIAGNOSIS — R7989 Other specified abnormal findings of blood chemistry: Secondary | ICD-10-CM | POA: Diagnosis not present

## 2023-10-04 DIAGNOSIS — I509 Heart failure, unspecified: Secondary | ICD-10-CM | POA: Diagnosis not present

## 2023-10-04 DIAGNOSIS — Z794 Long term (current) use of insulin: Secondary | ICD-10-CM | POA: Diagnosis not present

## 2023-10-04 DIAGNOSIS — E1122 Type 2 diabetes mellitus with diabetic chronic kidney disease: Secondary | ICD-10-CM | POA: Diagnosis not present

## 2023-10-04 DIAGNOSIS — I82513 Chronic embolism and thrombosis of femoral vein, bilateral: Secondary | ICD-10-CM | POA: Diagnosis not present

## 2023-10-04 DIAGNOSIS — E876 Hypokalemia: Secondary | ICD-10-CM | POA: Diagnosis not present

## 2023-10-04 DIAGNOSIS — E1151 Type 2 diabetes mellitus with diabetic peripheral angiopathy without gangrene: Secondary | ICD-10-CM | POA: Diagnosis not present

## 2023-10-04 DIAGNOSIS — I82532 Chronic embolism and thrombosis of left popliteal vein: Secondary | ICD-10-CM | POA: Diagnosis not present

## 2023-10-04 DIAGNOSIS — N186 End stage renal disease: Secondary | ICD-10-CM | POA: Diagnosis not present

## 2023-10-04 DIAGNOSIS — E1165 Type 2 diabetes mellitus with hyperglycemia: Secondary | ICD-10-CM | POA: Diagnosis not present

## 2023-10-04 DIAGNOSIS — T8611 Kidney transplant rejection: Principal | ICD-10-CM

## 2023-10-05 DIAGNOSIS — E1122 Type 2 diabetes mellitus with diabetic chronic kidney disease: Secondary | ICD-10-CM | POA: Diagnosis not present

## 2023-10-05 DIAGNOSIS — I509 Heart failure, unspecified: Secondary | ICD-10-CM | POA: Diagnosis not present

## 2023-10-05 DIAGNOSIS — Z794 Long term (current) use of insulin: Secondary | ICD-10-CM | POA: Diagnosis not present

## 2023-10-05 DIAGNOSIS — Z992 Dependence on renal dialysis: Secondary | ICD-10-CM | POA: Diagnosis not present

## 2023-10-05 DIAGNOSIS — E1151 Type 2 diabetes mellitus with diabetic peripheral angiopathy without gangrene: Secondary | ICD-10-CM | POA: Diagnosis not present

## 2023-10-05 DIAGNOSIS — E1165 Type 2 diabetes mellitus with hyperglycemia: Secondary | ICD-10-CM | POA: Diagnosis not present

## 2023-10-05 DIAGNOSIS — N186 End stage renal disease: Secondary | ICD-10-CM | POA: Diagnosis not present

## 2023-10-05 DIAGNOSIS — D631 Anemia in chronic kidney disease: Secondary | ICD-10-CM | POA: Diagnosis not present

## 2023-10-05 DIAGNOSIS — E875 Hyperkalemia: Secondary | ICD-10-CM | POA: Diagnosis not present

## 2023-10-06 ENCOUNTER — Encounter
Admission: TF | Admit: 2023-10-06 | Discharge: 2023-10-27 | Disposition: A | Discharge status: Home Health | Payer: Medicare (Managed Care) | Source: Intra-hospital | Attending: Physical Medicine & Rehabilitation | Admitting: Physical Medicine & Rehabilitation

## 2023-10-06 ENCOUNTER — Encounter
Admission: TF | Admit: 2023-10-06 | Payer: Medicare (Managed Care) | Source: Intra-hospital | Attending: Physical Medicine & Rehabilitation | Admitting: Physical Medicine & Rehabilitation

## 2023-10-06 ENCOUNTER — Ambulatory Visit
Admission: TF | Admit: 2023-10-06 | Payer: Medicare (Managed Care) | Source: Intra-hospital | Admitting: Physical Medicine & Rehabilitation

## 2023-10-06 ENCOUNTER — Ambulatory Visit
Admission: TF | Admit: 2023-10-06 | Discharge: 2023-10-27 | Disposition: A | Discharge status: Home Health | Payer: Medicare (Managed Care) | Source: Intra-hospital | Admitting: Physical Medicine & Rehabilitation

## 2023-10-06 ENCOUNTER — Encounter
Admission: TF | Admit: 2023-10-06 | Discharge: 2023-10-27 | Payer: Medicare (Managed Care) | Source: Intra-hospital | Attending: Physical Medicine & Rehabilitation | Admitting: Physical Medicine & Rehabilitation

## 2023-10-06 ENCOUNTER — Encounter
Admission: TF | Admit: 2023-10-06 | Payer: Medicare (Managed Care) | Source: Intra-hospital | Attending: Student in an Organized Health Care Education/Training Program | Admitting: Physical Medicine & Rehabilitation

## 2023-10-06 ENCOUNTER — Inpatient Hospital Stay
Admission: TF | Admit: 2023-10-06 | Discharge: 2023-10-27 | Disposition: A | Discharge status: Home Health | Payer: Medicare (Managed Care) | Source: Intra-hospital | Admitting: Physical Medicine & Rehabilitation

## 2023-10-06 ENCOUNTER — Ambulatory Visit
Admission: TF | Admit: 2023-10-06 | Discharge: 2023-10-27 | Payer: Medicare (Managed Care) | Source: Intra-hospital | Admitting: Physical Medicine & Rehabilitation

## 2023-10-06 DIAGNOSIS — E119 Type 2 diabetes mellitus without complications: Secondary | ICD-10-CM | POA: Diagnosis not present

## 2023-10-06 DIAGNOSIS — Z7902 Long term (current) use of antithrombotics/antiplatelets: Secondary | ICD-10-CM | POA: Diagnosis not present

## 2023-10-06 DIAGNOSIS — Z4781 Encounter for orthopedic aftercare following surgical amputation: Secondary | ICD-10-CM | POA: Diagnosis not present

## 2023-10-06 DIAGNOSIS — Z940 Kidney transplant status: Secondary | ICD-10-CM | POA: Diagnosis not present

## 2023-10-06 DIAGNOSIS — Z992 Dependence on renal dialysis: Secondary | ICD-10-CM | POA: Diagnosis not present

## 2023-10-06 DIAGNOSIS — Z7982 Long term (current) use of aspirin: Secondary | ICD-10-CM | POA: Diagnosis not present

## 2023-10-06 DIAGNOSIS — N400 Enlarged prostate without lower urinary tract symptoms: Secondary | ICD-10-CM | POA: Diagnosis not present

## 2023-10-06 DIAGNOSIS — A499 Bacterial infection, unspecified: Secondary | ICD-10-CM | POA: Diagnosis not present

## 2023-10-06 DIAGNOSIS — Z79891 Long term (current) use of opiate analgesic: Secondary | ICD-10-CM | POA: Diagnosis not present

## 2023-10-06 DIAGNOSIS — Z9861 Coronary angioplasty status: Secondary | ICD-10-CM | POA: Diagnosis not present

## 2023-10-06 DIAGNOSIS — R279 Unspecified lack of coordination: Secondary | ICD-10-CM | POA: Diagnosis not present

## 2023-10-06 DIAGNOSIS — Z89512 Acquired absence of left leg below knee: Secondary | ICD-10-CM | POA: Diagnosis not present

## 2023-10-06 DIAGNOSIS — I110 Hypertensive heart disease with heart failure: Secondary | ICD-10-CM | POA: Diagnosis not present

## 2023-10-06 DIAGNOSIS — E7800 Pure hypercholesterolemia, unspecified: Secondary | ICD-10-CM | POA: Diagnosis not present

## 2023-10-06 DIAGNOSIS — E1151 Type 2 diabetes mellitus with diabetic peripheral angiopathy without gangrene: Secondary | ICD-10-CM | POA: Diagnosis not present

## 2023-10-06 DIAGNOSIS — I5022 Chronic systolic (congestive) heart failure: Secondary | ICD-10-CM | POA: Diagnosis not present

## 2023-10-06 DIAGNOSIS — B961 Klebsiella pneumoniae [K. pneumoniae] as the cause of diseases classified elsewhere: Secondary | ICD-10-CM | POA: Diagnosis not present

## 2023-10-06 DIAGNOSIS — K219 Gastro-esophageal reflux disease without esophagitis: Secondary | ICD-10-CM | POA: Diagnosis not present

## 2023-10-06 DIAGNOSIS — M6281 Muscle weakness (generalized): Secondary | ICD-10-CM | POA: Diagnosis not present

## 2023-10-06 DIAGNOSIS — N186 End stage renal disease: Secondary | ICD-10-CM | POA: Diagnosis not present

## 2023-10-06 DIAGNOSIS — K2950 Unspecified chronic gastritis without bleeding: Secondary | ICD-10-CM | POA: Diagnosis not present

## 2023-10-06 DIAGNOSIS — Z792 Long term (current) use of antibiotics: Secondary | ICD-10-CM | POA: Diagnosis not present

## 2023-10-06 DIAGNOSIS — D631 Anemia in chronic kidney disease: Secondary | ICD-10-CM | POA: Diagnosis not present

## 2023-10-06 DIAGNOSIS — K5900 Constipation, unspecified: Secondary | ICD-10-CM | POA: Diagnosis not present

## 2023-10-06 DIAGNOSIS — Z7901 Long term (current) use of anticoagulants: Secondary | ICD-10-CM | POA: Diagnosis not present

## 2023-10-06 DIAGNOSIS — Z955 Presence of coronary angioplasty implant and graft: Secondary | ICD-10-CM | POA: Diagnosis not present

## 2023-10-06 DIAGNOSIS — I5032 Chronic diastolic (congestive) heart failure: Secondary | ICD-10-CM | POA: Diagnosis not present

## 2023-10-06 DIAGNOSIS — B952 Enterococcus as the cause of diseases classified elsewhere: Secondary | ICD-10-CM | POA: Diagnosis not present

## 2023-10-06 DIAGNOSIS — I50813 Acute on chronic right heart failure: Secondary | ICD-10-CM | POA: Diagnosis not present

## 2023-10-06 DIAGNOSIS — I5030 Unspecified diastolic (congestive) heart failure: Secondary | ICD-10-CM | POA: Diagnosis not present

## 2023-10-06 DIAGNOSIS — N390 Urinary tract infection, site not specified: Secondary | ICD-10-CM | POA: Diagnosis not present

## 2023-10-06 DIAGNOSIS — Z794 Long term (current) use of insulin: Secondary | ICD-10-CM | POA: Diagnosis not present

## 2023-10-06 DIAGNOSIS — Z79899 Other long term (current) drug therapy: Secondary | ICD-10-CM | POA: Diagnosis not present

## 2023-10-06 DIAGNOSIS — I5020 Unspecified systolic (congestive) heart failure: Secondary | ICD-10-CM | POA: Diagnosis not present

## 2023-10-06 DIAGNOSIS — R5381 Other malaise: Secondary | ICD-10-CM | POA: Diagnosis not present

## 2023-10-06 DIAGNOSIS — Z89511 Acquired absence of right leg below knee: Secondary | ICD-10-CM | POA: Diagnosis not present

## 2023-10-06 DIAGNOSIS — I2510 Atherosclerotic heart disease of native coronary artery without angina pectoris: Secondary | ICD-10-CM | POA: Diagnosis not present

## 2023-10-10 DIAGNOSIS — Z89512 Acquired absence of left leg below knee: Principal | ICD-10-CM

## 2023-10-10 DIAGNOSIS — I5022 Chronic systolic (congestive) heart failure: Principal | ICD-10-CM

## 2023-10-10 DIAGNOSIS — K2950 Unspecified chronic gastritis without bleeding: Principal | ICD-10-CM

## 2023-10-10 DIAGNOSIS — Z940 Kidney transplant status: Principal | ICD-10-CM

## 2023-10-10 DIAGNOSIS — Z7982 Long term (current) use of aspirin: Principal | ICD-10-CM

## 2023-10-10 DIAGNOSIS — I110 Hypertensive heart disease with heart failure: Principal | ICD-10-CM

## 2023-10-10 DIAGNOSIS — E7800 Pure hypercholesterolemia, unspecified: Principal | ICD-10-CM

## 2023-10-10 DIAGNOSIS — I2510 Atherosclerotic heart disease of native coronary artery without angina pectoris: Principal | ICD-10-CM

## 2023-10-10 DIAGNOSIS — Z955 Presence of coronary angioplasty implant and graft: Principal | ICD-10-CM

## 2023-10-10 DIAGNOSIS — E1151 Type 2 diabetes mellitus with diabetic peripheral angiopathy without gangrene: Principal | ICD-10-CM

## 2023-10-10 DIAGNOSIS — M86172 Other acute osteomyelitis, left ankle and foot: Principal | ICD-10-CM

## 2023-10-10 DIAGNOSIS — Z794 Long term (current) use of insulin: Principal | ICD-10-CM

## 2023-10-10 DIAGNOSIS — K5900 Constipation, unspecified: Principal | ICD-10-CM

## 2023-10-10 DIAGNOSIS — E1169 Type 2 diabetes mellitus with other specified complication: Principal | ICD-10-CM

## 2023-10-10 DIAGNOSIS — N400 Enlarged prostate without lower urinary tract symptoms: Principal | ICD-10-CM

## 2023-10-10 DIAGNOSIS — L97529 Non-pressure chronic ulcer of other part of left foot with unspecified severity: Principal | ICD-10-CM

## 2023-10-10 DIAGNOSIS — Z89511 Acquired absence of right leg below knee: Principal | ICD-10-CM

## 2023-10-10 DIAGNOSIS — K219 Gastro-esophageal reflux disease without esophagitis: Principal | ICD-10-CM

## 2023-10-12 DIAGNOSIS — I2510 Atherosclerotic heart disease of native coronary artery without angina pectoris: Principal | ICD-10-CM

## 2023-10-12 DIAGNOSIS — Z955 Presence of coronary angioplasty implant and graft: Principal | ICD-10-CM

## 2023-10-12 DIAGNOSIS — Z794 Long term (current) use of insulin: Principal | ICD-10-CM

## 2023-10-12 DIAGNOSIS — Z940 Kidney transplant status: Principal | ICD-10-CM

## 2023-10-12 DIAGNOSIS — K219 Gastro-esophageal reflux disease without esophagitis: Principal | ICD-10-CM

## 2023-10-12 DIAGNOSIS — Z89511 Acquired absence of right leg below knee: Principal | ICD-10-CM

## 2023-10-12 DIAGNOSIS — Z7982 Long term (current) use of aspirin: Principal | ICD-10-CM

## 2023-10-12 DIAGNOSIS — I5022 Chronic systolic (congestive) heart failure: Principal | ICD-10-CM

## 2023-10-12 DIAGNOSIS — I110 Hypertensive heart disease with heart failure: Principal | ICD-10-CM

## 2023-10-12 DIAGNOSIS — K5900 Constipation, unspecified: Principal | ICD-10-CM

## 2023-10-12 DIAGNOSIS — E1169 Type 2 diabetes mellitus with other specified complication: Principal | ICD-10-CM

## 2023-10-12 DIAGNOSIS — M86172 Other acute osteomyelitis, left ankle and foot: Principal | ICD-10-CM

## 2023-10-12 DIAGNOSIS — N400 Enlarged prostate without lower urinary tract symptoms: Principal | ICD-10-CM

## 2023-10-12 DIAGNOSIS — E1151 Type 2 diabetes mellitus with diabetic peripheral angiopathy without gangrene: Principal | ICD-10-CM

## 2023-10-12 DIAGNOSIS — L97529 Non-pressure chronic ulcer of other part of left foot with unspecified severity: Principal | ICD-10-CM

## 2023-10-12 DIAGNOSIS — K2950 Unspecified chronic gastritis without bleeding: Principal | ICD-10-CM

## 2023-10-12 DIAGNOSIS — E7800 Pure hypercholesterolemia, unspecified: Principal | ICD-10-CM

## 2023-10-12 DIAGNOSIS — Z89512 Acquired absence of left leg below knee: Principal | ICD-10-CM

## 2023-10-14 DIAGNOSIS — I5022 Chronic systolic (congestive) heart failure: Principal | ICD-10-CM

## 2023-10-14 DIAGNOSIS — K5900 Constipation, unspecified: Principal | ICD-10-CM

## 2023-10-14 DIAGNOSIS — Z940 Kidney transplant status: Principal | ICD-10-CM

## 2023-10-14 DIAGNOSIS — M86172 Other acute osteomyelitis, left ankle and foot: Principal | ICD-10-CM

## 2023-10-14 DIAGNOSIS — E1169 Type 2 diabetes mellitus with other specified complication: Principal | ICD-10-CM

## 2023-10-14 DIAGNOSIS — K219 Gastro-esophageal reflux disease without esophagitis: Principal | ICD-10-CM

## 2023-10-14 DIAGNOSIS — I2510 Atherosclerotic heart disease of native coronary artery without angina pectoris: Principal | ICD-10-CM

## 2023-10-14 DIAGNOSIS — L97529 Non-pressure chronic ulcer of other part of left foot with unspecified severity: Principal | ICD-10-CM

## 2023-10-14 DIAGNOSIS — N400 Enlarged prostate without lower urinary tract symptoms: Principal | ICD-10-CM

## 2023-10-14 DIAGNOSIS — E1151 Type 2 diabetes mellitus with diabetic peripheral angiopathy without gangrene: Principal | ICD-10-CM

## 2023-10-14 DIAGNOSIS — E7800 Pure hypercholesterolemia, unspecified: Principal | ICD-10-CM

## 2023-10-14 DIAGNOSIS — I110 Hypertensive heart disease with heart failure: Principal | ICD-10-CM

## 2023-10-14 DIAGNOSIS — Z7982 Long term (current) use of aspirin: Principal | ICD-10-CM

## 2023-10-14 DIAGNOSIS — Z955 Presence of coronary angioplasty implant and graft: Principal | ICD-10-CM

## 2023-10-14 DIAGNOSIS — Z89511 Acquired absence of right leg below knee: Principal | ICD-10-CM

## 2023-10-14 DIAGNOSIS — K2950 Unspecified chronic gastritis without bleeding: Principal | ICD-10-CM

## 2023-10-14 DIAGNOSIS — Z89512 Acquired absence of left leg below knee: Principal | ICD-10-CM

## 2023-10-14 DIAGNOSIS — Z794 Long term (current) use of insulin: Principal | ICD-10-CM

## 2023-10-15 DIAGNOSIS — Z955 Presence of coronary angioplasty implant and graft: Principal | ICD-10-CM

## 2023-10-15 DIAGNOSIS — K219 Gastro-esophageal reflux disease without esophagitis: Principal | ICD-10-CM

## 2023-10-15 DIAGNOSIS — Z7982 Long term (current) use of aspirin: Principal | ICD-10-CM

## 2023-10-15 DIAGNOSIS — I5022 Chronic systolic (congestive) heart failure: Principal | ICD-10-CM

## 2023-10-15 DIAGNOSIS — K2950 Unspecified chronic gastritis without bleeding: Principal | ICD-10-CM

## 2023-10-15 DIAGNOSIS — Z89511 Acquired absence of right leg below knee: Principal | ICD-10-CM

## 2023-10-15 DIAGNOSIS — E1169 Type 2 diabetes mellitus with other specified complication: Principal | ICD-10-CM

## 2023-10-15 DIAGNOSIS — Z794 Long term (current) use of insulin: Principal | ICD-10-CM

## 2023-10-15 DIAGNOSIS — N400 Enlarged prostate without lower urinary tract symptoms: Principal | ICD-10-CM

## 2023-10-15 DIAGNOSIS — I110 Hypertensive heart disease with heart failure: Principal | ICD-10-CM

## 2023-10-15 DIAGNOSIS — E1151 Type 2 diabetes mellitus with diabetic peripheral angiopathy without gangrene: Principal | ICD-10-CM

## 2023-10-15 DIAGNOSIS — I2510 Atherosclerotic heart disease of native coronary artery without angina pectoris: Principal | ICD-10-CM

## 2023-10-15 DIAGNOSIS — Z940 Kidney transplant status: Principal | ICD-10-CM

## 2023-10-15 DIAGNOSIS — K5900 Constipation, unspecified: Principal | ICD-10-CM

## 2023-10-15 DIAGNOSIS — E7800 Pure hypercholesterolemia, unspecified: Principal | ICD-10-CM

## 2023-10-15 DIAGNOSIS — M86172 Other acute osteomyelitis, left ankle and foot: Principal | ICD-10-CM

## 2023-10-15 DIAGNOSIS — L97529 Non-pressure chronic ulcer of other part of left foot with unspecified severity: Principal | ICD-10-CM

## 2023-10-15 DIAGNOSIS — Z89512 Acquired absence of left leg below knee: Principal | ICD-10-CM

## 2023-10-16 DIAGNOSIS — I110 Hypertensive heart disease with heart failure: Principal | ICD-10-CM

## 2023-10-16 DIAGNOSIS — Z89512 Acquired absence of left leg below knee: Principal | ICD-10-CM

## 2023-10-16 DIAGNOSIS — I5022 Chronic systolic (congestive) heart failure: Principal | ICD-10-CM

## 2023-10-16 DIAGNOSIS — K5900 Constipation, unspecified: Principal | ICD-10-CM

## 2023-10-16 DIAGNOSIS — K2950 Unspecified chronic gastritis without bleeding: Principal | ICD-10-CM

## 2023-10-16 DIAGNOSIS — Z940 Kidney transplant status: Principal | ICD-10-CM

## 2023-10-16 DIAGNOSIS — Z7982 Long term (current) use of aspirin: Principal | ICD-10-CM

## 2023-10-16 DIAGNOSIS — K219 Gastro-esophageal reflux disease without esophagitis: Principal | ICD-10-CM

## 2023-10-16 DIAGNOSIS — N400 Enlarged prostate without lower urinary tract symptoms: Principal | ICD-10-CM

## 2023-10-16 DIAGNOSIS — E1151 Type 2 diabetes mellitus with diabetic peripheral angiopathy without gangrene: Principal | ICD-10-CM

## 2023-10-16 DIAGNOSIS — M86172 Other acute osteomyelitis, left ankle and foot: Principal | ICD-10-CM

## 2023-10-16 DIAGNOSIS — L97529 Non-pressure chronic ulcer of other part of left foot with unspecified severity: Principal | ICD-10-CM

## 2023-10-16 DIAGNOSIS — Z794 Long term (current) use of insulin: Principal | ICD-10-CM

## 2023-10-16 DIAGNOSIS — Z955 Presence of coronary angioplasty implant and graft: Principal | ICD-10-CM

## 2023-10-16 DIAGNOSIS — Z89511 Acquired absence of right leg below knee: Principal | ICD-10-CM

## 2023-10-16 DIAGNOSIS — E7800 Pure hypercholesterolemia, unspecified: Principal | ICD-10-CM

## 2023-10-16 DIAGNOSIS — I2510 Atherosclerotic heart disease of native coronary artery without angina pectoris: Principal | ICD-10-CM

## 2023-10-16 DIAGNOSIS — E1169 Type 2 diabetes mellitus with other specified complication: Principal | ICD-10-CM

## 2023-10-17 DIAGNOSIS — K219 Gastro-esophageal reflux disease without esophagitis: Principal | ICD-10-CM

## 2023-10-17 DIAGNOSIS — E7800 Pure hypercholesterolemia, unspecified: Principal | ICD-10-CM

## 2023-10-17 DIAGNOSIS — L97529 Non-pressure chronic ulcer of other part of left foot with unspecified severity: Principal | ICD-10-CM

## 2023-10-17 DIAGNOSIS — E1151 Type 2 diabetes mellitus with diabetic peripheral angiopathy without gangrene: Principal | ICD-10-CM

## 2023-10-17 DIAGNOSIS — I5022 Chronic systolic (congestive) heart failure: Principal | ICD-10-CM

## 2023-10-17 DIAGNOSIS — Z940 Kidney transplant status: Principal | ICD-10-CM

## 2023-10-17 DIAGNOSIS — K2950 Unspecified chronic gastritis without bleeding: Principal | ICD-10-CM

## 2023-10-17 DIAGNOSIS — Z794 Long term (current) use of insulin: Principal | ICD-10-CM

## 2023-10-17 DIAGNOSIS — K5900 Constipation, unspecified: Principal | ICD-10-CM

## 2023-10-17 DIAGNOSIS — M86172 Other acute osteomyelitis, left ankle and foot: Principal | ICD-10-CM

## 2023-10-17 DIAGNOSIS — I110 Hypertensive heart disease with heart failure: Principal | ICD-10-CM

## 2023-10-17 DIAGNOSIS — Z89511 Acquired absence of right leg below knee: Principal | ICD-10-CM

## 2023-10-17 DIAGNOSIS — Z7982 Long term (current) use of aspirin: Principal | ICD-10-CM

## 2023-10-17 DIAGNOSIS — Z89512 Acquired absence of left leg below knee: Principal | ICD-10-CM

## 2023-10-17 DIAGNOSIS — I2510 Atherosclerotic heart disease of native coronary artery without angina pectoris: Principal | ICD-10-CM

## 2023-10-17 DIAGNOSIS — N400 Enlarged prostate without lower urinary tract symptoms: Principal | ICD-10-CM

## 2023-10-17 DIAGNOSIS — E1169 Type 2 diabetes mellitus with other specified complication: Principal | ICD-10-CM

## 2023-10-17 DIAGNOSIS — Z955 Presence of coronary angioplasty implant and graft: Principal | ICD-10-CM

## 2023-10-17 DIAGNOSIS — T8611 Kidney transplant rejection: Principal | ICD-10-CM

## 2023-10-18 DIAGNOSIS — I110 Hypertensive heart disease with heart failure: Principal | ICD-10-CM

## 2023-10-18 DIAGNOSIS — E7800 Pure hypercholesterolemia, unspecified: Principal | ICD-10-CM

## 2023-10-18 DIAGNOSIS — I5022 Chronic systolic (congestive) heart failure: Principal | ICD-10-CM

## 2023-10-18 DIAGNOSIS — N400 Enlarged prostate without lower urinary tract symptoms: Principal | ICD-10-CM

## 2023-10-18 DIAGNOSIS — E1151 Type 2 diabetes mellitus with diabetic peripheral angiopathy without gangrene: Principal | ICD-10-CM

## 2023-10-18 DIAGNOSIS — Z89511 Acquired absence of right leg below knee: Principal | ICD-10-CM

## 2023-10-18 DIAGNOSIS — L97529 Non-pressure chronic ulcer of other part of left foot with unspecified severity: Principal | ICD-10-CM

## 2023-10-18 DIAGNOSIS — Z89512 Acquired absence of left leg below knee: Principal | ICD-10-CM

## 2023-10-18 DIAGNOSIS — Z940 Kidney transplant status: Principal | ICD-10-CM

## 2023-10-18 DIAGNOSIS — T8611 Kidney transplant rejection: Principal | ICD-10-CM

## 2023-10-18 DIAGNOSIS — K219 Gastro-esophageal reflux disease without esophagitis: Principal | ICD-10-CM

## 2023-10-18 DIAGNOSIS — I2510 Atherosclerotic heart disease of native coronary artery without angina pectoris: Principal | ICD-10-CM

## 2023-10-18 DIAGNOSIS — K5900 Constipation, unspecified: Principal | ICD-10-CM

## 2023-10-18 DIAGNOSIS — E1169 Type 2 diabetes mellitus with other specified complication: Principal | ICD-10-CM

## 2023-10-18 DIAGNOSIS — M86172 Other acute osteomyelitis, left ankle and foot: Principal | ICD-10-CM

## 2023-10-18 DIAGNOSIS — Z7982 Long term (current) use of aspirin: Principal | ICD-10-CM

## 2023-10-18 DIAGNOSIS — Z955 Presence of coronary angioplasty implant and graft: Principal | ICD-10-CM

## 2023-10-18 DIAGNOSIS — Z794 Long term (current) use of insulin: Principal | ICD-10-CM

## 2023-10-18 DIAGNOSIS — K2950 Unspecified chronic gastritis without bleeding: Principal | ICD-10-CM

## 2023-10-19 DIAGNOSIS — I110 Hypertensive heart disease with heart failure: Principal | ICD-10-CM

## 2023-10-19 DIAGNOSIS — K219 Gastro-esophageal reflux disease without esophagitis: Principal | ICD-10-CM

## 2023-10-19 DIAGNOSIS — Z794 Long term (current) use of insulin: Principal | ICD-10-CM

## 2023-10-19 DIAGNOSIS — K2950 Unspecified chronic gastritis without bleeding: Principal | ICD-10-CM

## 2023-10-19 DIAGNOSIS — T8611 Kidney transplant rejection: Principal | ICD-10-CM

## 2023-10-19 DIAGNOSIS — L97529 Non-pressure chronic ulcer of other part of left foot with unspecified severity: Principal | ICD-10-CM

## 2023-10-19 DIAGNOSIS — E7800 Pure hypercholesterolemia, unspecified: Principal | ICD-10-CM

## 2023-10-19 DIAGNOSIS — Z89512 Acquired absence of left leg below knee: Principal | ICD-10-CM

## 2023-10-19 DIAGNOSIS — M86172 Other acute osteomyelitis, left ankle and foot: Principal | ICD-10-CM

## 2023-10-19 DIAGNOSIS — Z955 Presence of coronary angioplasty implant and graft: Principal | ICD-10-CM

## 2023-10-19 DIAGNOSIS — N400 Enlarged prostate without lower urinary tract symptoms: Principal | ICD-10-CM

## 2023-10-19 DIAGNOSIS — K5900 Constipation, unspecified: Principal | ICD-10-CM

## 2023-10-19 DIAGNOSIS — Z7982 Long term (current) use of aspirin: Principal | ICD-10-CM

## 2023-10-19 DIAGNOSIS — Z940 Kidney transplant status: Principal | ICD-10-CM

## 2023-10-19 DIAGNOSIS — E1151 Type 2 diabetes mellitus with diabetic peripheral angiopathy without gangrene: Principal | ICD-10-CM

## 2023-10-19 DIAGNOSIS — E1169 Type 2 diabetes mellitus with other specified complication: Principal | ICD-10-CM

## 2023-10-19 DIAGNOSIS — Z89511 Acquired absence of right leg below knee: Principal | ICD-10-CM

## 2023-10-19 DIAGNOSIS — I5022 Chronic systolic (congestive) heart failure: Principal | ICD-10-CM

## 2023-10-19 DIAGNOSIS — I2510 Atherosclerotic heart disease of native coronary artery without angina pectoris: Principal | ICD-10-CM

## 2023-10-20 DIAGNOSIS — M86172 Other acute osteomyelitis, left ankle and foot: Principal | ICD-10-CM

## 2023-10-20 DIAGNOSIS — I110 Hypertensive heart disease with heart failure: Principal | ICD-10-CM

## 2023-10-20 DIAGNOSIS — Z89511 Acquired absence of right leg below knee: Principal | ICD-10-CM

## 2023-10-20 DIAGNOSIS — Z89512 Acquired absence of left leg below knee: Principal | ICD-10-CM

## 2023-10-20 DIAGNOSIS — Z955 Presence of coronary angioplasty implant and graft: Principal | ICD-10-CM

## 2023-10-20 DIAGNOSIS — I2510 Atherosclerotic heart disease of native coronary artery without angina pectoris: Principal | ICD-10-CM

## 2023-10-20 DIAGNOSIS — I5022 Chronic systolic (congestive) heart failure: Principal | ICD-10-CM

## 2023-10-20 DIAGNOSIS — K219 Gastro-esophageal reflux disease without esophagitis: Principal | ICD-10-CM

## 2023-10-20 DIAGNOSIS — Z794 Long term (current) use of insulin: Principal | ICD-10-CM

## 2023-10-20 DIAGNOSIS — K5900 Constipation, unspecified: Principal | ICD-10-CM

## 2023-10-20 DIAGNOSIS — E7800 Pure hypercholesterolemia, unspecified: Principal | ICD-10-CM

## 2023-10-20 DIAGNOSIS — Z7982 Long term (current) use of aspirin: Principal | ICD-10-CM

## 2023-10-20 DIAGNOSIS — Z940 Kidney transplant status: Principal | ICD-10-CM

## 2023-10-20 DIAGNOSIS — L97529 Non-pressure chronic ulcer of other part of left foot with unspecified severity: Principal | ICD-10-CM

## 2023-10-20 DIAGNOSIS — E1169 Type 2 diabetes mellitus with other specified complication: Principal | ICD-10-CM

## 2023-10-20 DIAGNOSIS — T8611 Kidney transplant rejection: Principal | ICD-10-CM

## 2023-10-20 DIAGNOSIS — K2950 Unspecified chronic gastritis without bleeding: Principal | ICD-10-CM

## 2023-10-20 DIAGNOSIS — N400 Enlarged prostate without lower urinary tract symptoms: Principal | ICD-10-CM

## 2023-10-20 DIAGNOSIS — E1151 Type 2 diabetes mellitus with diabetic peripheral angiopathy without gangrene: Principal | ICD-10-CM

## 2023-10-20 MED ORDER — TAMSULOSIN 0.4 MG CAPSULE
ORAL_CAPSULE | Freq: Every day | ORAL | 3 refills | 90.00000 days | Status: CP
Start: 2023-10-20 — End: ?

## 2023-10-20 MED ORDER — LOSARTAN 50 MG TABLET
ORAL_TABLET | Freq: Every day | ORAL | 0 refills | 30.00000 days | Status: CP
Start: 2023-10-20 — End: 2023-11-19

## 2023-10-20 MED ORDER — SPIRONOLACTONE 25 MG TABLET
ORAL_TABLET | Freq: Every day | ORAL | 0 refills | 90.00000 days | Status: CP
Start: 2023-10-20 — End: 2024-01-18

## 2023-10-20 MED ORDER — CLOPIDOGREL 75 MG TABLET
ORAL_TABLET | Freq: Every day | ORAL | 2 refills | 30.00000 days | Status: CP
Start: 2023-10-20 — End: 2024-01-18
  Filled 2023-10-27: qty 30, 30d supply, fill #0

## 2023-10-20 MED ORDER — SEVELAMER CARBONATE 800 MG TABLET
ORAL_TABLET | Freq: Three times a day (TID) | ORAL | 0 refills | 30.00000 days | Status: CP
Start: 2023-10-20 — End: 2023-11-19

## 2023-10-20 MED ORDER — OXYCODONE 10 MG TABLET
ORAL_TABLET | ORAL | 0 refills | 2.00000 days | Status: CP | PRN
Start: 2023-10-20 — End: 2023-10-25
  Filled 2023-10-27: qty 10, 2d supply, fill #0

## 2023-10-20 MED ORDER — ATORVASTATIN 40 MG TABLET
ORAL_TABLET | Freq: Every day | ORAL | 0 refills | 90.00000 days | Status: CP
Start: 2023-10-20 — End: 2024-01-18

## 2023-10-20 MED ORDER — ACETAMINOPHEN 500 MG TABLET
ORAL_TABLET | Freq: Three times a day (TID) | ORAL | 1 refills | 5.00000 days | Status: CP
Start: 2023-10-20 — End: ?
  Filled 2023-10-27: qty 30, 5d supply, fill #0

## 2023-10-20 MED ORDER — BLOOD-GLUCOSE METER KIT WRAPPER
ORAL | 0 refills | 0.00000 days | Status: CP
Start: 2023-10-20 — End: 2024-10-19

## 2023-10-20 MED ORDER — DULOXETINE 30 MG CAPSULE,DELAYED RELEASE
ORAL_CAPSULE | Freq: Two times a day (BID) | ORAL | 2 refills | 30.00000 days | Status: CP
Start: 2023-10-20 — End: 2024-01-18
  Filled 2023-10-27: qty 60, 30d supply, fill #0

## 2023-10-20 MED ORDER — CARVEDILOL 25 MG TABLET
ORAL_TABLET | Freq: Two times a day (BID) | ORAL | 0 refills | 90.00000 days | Status: CP
Start: 2023-10-20 — End: 2024-01-18

## 2023-10-21 DIAGNOSIS — Z7982 Long term (current) use of aspirin: Principal | ICD-10-CM

## 2023-10-21 DIAGNOSIS — K219 Gastro-esophageal reflux disease without esophagitis: Principal | ICD-10-CM

## 2023-10-21 DIAGNOSIS — I110 Hypertensive heart disease with heart failure: Principal | ICD-10-CM

## 2023-10-21 DIAGNOSIS — E1151 Type 2 diabetes mellitus with diabetic peripheral angiopathy without gangrene: Principal | ICD-10-CM

## 2023-10-21 DIAGNOSIS — I5022 Chronic systolic (congestive) heart failure: Principal | ICD-10-CM

## 2023-10-21 DIAGNOSIS — Z955 Presence of coronary angioplasty implant and graft: Principal | ICD-10-CM

## 2023-10-21 DIAGNOSIS — K5900 Constipation, unspecified: Principal | ICD-10-CM

## 2023-10-21 DIAGNOSIS — E7800 Pure hypercholesterolemia, unspecified: Principal | ICD-10-CM

## 2023-10-21 DIAGNOSIS — L97529 Non-pressure chronic ulcer of other part of left foot with unspecified severity: Principal | ICD-10-CM

## 2023-10-21 DIAGNOSIS — Z940 Kidney transplant status: Principal | ICD-10-CM

## 2023-10-21 DIAGNOSIS — T8611 Kidney transplant rejection: Principal | ICD-10-CM

## 2023-10-21 DIAGNOSIS — M86172 Other acute osteomyelitis, left ankle and foot: Principal | ICD-10-CM

## 2023-10-21 DIAGNOSIS — Z89512 Acquired absence of left leg below knee: Principal | ICD-10-CM

## 2023-10-21 DIAGNOSIS — Z89511 Acquired absence of right leg below knee: Principal | ICD-10-CM

## 2023-10-21 DIAGNOSIS — E1169 Type 2 diabetes mellitus with other specified complication: Principal | ICD-10-CM

## 2023-10-21 DIAGNOSIS — N400 Enlarged prostate without lower urinary tract symptoms: Principal | ICD-10-CM

## 2023-10-21 DIAGNOSIS — I2510 Atherosclerotic heart disease of native coronary artery without angina pectoris: Principal | ICD-10-CM

## 2023-10-21 DIAGNOSIS — K2950 Unspecified chronic gastritis without bleeding: Principal | ICD-10-CM

## 2023-10-21 DIAGNOSIS — Z794 Long term (current) use of insulin: Principal | ICD-10-CM

## 2023-10-22 DIAGNOSIS — Z794 Long term (current) use of insulin: Principal | ICD-10-CM

## 2023-10-22 DIAGNOSIS — K2950 Unspecified chronic gastritis without bleeding: Principal | ICD-10-CM

## 2023-10-22 DIAGNOSIS — E1151 Type 2 diabetes mellitus with diabetic peripheral angiopathy without gangrene: Principal | ICD-10-CM

## 2023-10-22 DIAGNOSIS — T8611 Kidney transplant rejection: Principal | ICD-10-CM

## 2023-10-22 DIAGNOSIS — I110 Hypertensive heart disease with heart failure: Principal | ICD-10-CM

## 2023-10-22 DIAGNOSIS — N400 Enlarged prostate without lower urinary tract symptoms: Principal | ICD-10-CM

## 2023-10-22 DIAGNOSIS — K219 Gastro-esophageal reflux disease without esophagitis: Principal | ICD-10-CM

## 2023-10-22 DIAGNOSIS — E7800 Pure hypercholesterolemia, unspecified: Principal | ICD-10-CM

## 2023-10-22 DIAGNOSIS — Z7982 Long term (current) use of aspirin: Principal | ICD-10-CM

## 2023-10-22 DIAGNOSIS — L97529 Non-pressure chronic ulcer of other part of left foot with unspecified severity: Principal | ICD-10-CM

## 2023-10-22 DIAGNOSIS — I2510 Atherosclerotic heart disease of native coronary artery without angina pectoris: Principal | ICD-10-CM

## 2023-10-22 DIAGNOSIS — I5022 Chronic systolic (congestive) heart failure: Principal | ICD-10-CM

## 2023-10-22 DIAGNOSIS — Z940 Kidney transplant status: Principal | ICD-10-CM

## 2023-10-22 DIAGNOSIS — K5900 Constipation, unspecified: Principal | ICD-10-CM

## 2023-10-22 DIAGNOSIS — Z89511 Acquired absence of right leg below knee: Principal | ICD-10-CM

## 2023-10-22 DIAGNOSIS — Z89512 Acquired absence of left leg below knee: Principal | ICD-10-CM

## 2023-10-22 DIAGNOSIS — E1169 Type 2 diabetes mellitus with other specified complication: Principal | ICD-10-CM

## 2023-10-22 DIAGNOSIS — Z955 Presence of coronary angioplasty implant and graft: Principal | ICD-10-CM

## 2023-10-22 DIAGNOSIS — M86172 Other acute osteomyelitis, left ankle and foot: Principal | ICD-10-CM

## 2023-10-23 DIAGNOSIS — Z7982 Long term (current) use of aspirin: Principal | ICD-10-CM

## 2023-10-23 DIAGNOSIS — K2950 Unspecified chronic gastritis without bleeding: Principal | ICD-10-CM

## 2023-10-23 DIAGNOSIS — Z89512 Acquired absence of left leg below knee: Principal | ICD-10-CM

## 2023-10-23 DIAGNOSIS — I5022 Chronic systolic (congestive) heart failure: Principal | ICD-10-CM

## 2023-10-23 DIAGNOSIS — Z794 Long term (current) use of insulin: Principal | ICD-10-CM

## 2023-10-23 DIAGNOSIS — Z940 Kidney transplant status: Principal | ICD-10-CM

## 2023-10-23 DIAGNOSIS — Z955 Presence of coronary angioplasty implant and graft: Principal | ICD-10-CM

## 2023-10-23 DIAGNOSIS — M86172 Other acute osteomyelitis, left ankle and foot: Principal | ICD-10-CM

## 2023-10-23 DIAGNOSIS — Z89511 Acquired absence of right leg below knee: Principal | ICD-10-CM

## 2023-10-23 DIAGNOSIS — I110 Hypertensive heart disease with heart failure: Principal | ICD-10-CM

## 2023-10-23 DIAGNOSIS — I2510 Atherosclerotic heart disease of native coronary artery without angina pectoris: Principal | ICD-10-CM

## 2023-10-23 DIAGNOSIS — T8611 Kidney transplant rejection: Principal | ICD-10-CM

## 2023-10-23 DIAGNOSIS — L97529 Non-pressure chronic ulcer of other part of left foot with unspecified severity: Principal | ICD-10-CM

## 2023-10-23 DIAGNOSIS — E1169 Type 2 diabetes mellitus with other specified complication: Principal | ICD-10-CM

## 2023-10-23 DIAGNOSIS — E7800 Pure hypercholesterolemia, unspecified: Principal | ICD-10-CM

## 2023-10-23 DIAGNOSIS — E1151 Type 2 diabetes mellitus with diabetic peripheral angiopathy without gangrene: Principal | ICD-10-CM

## 2023-10-23 DIAGNOSIS — K219 Gastro-esophageal reflux disease without esophagitis: Principal | ICD-10-CM

## 2023-10-23 DIAGNOSIS — K5900 Constipation, unspecified: Principal | ICD-10-CM

## 2023-10-23 DIAGNOSIS — N400 Enlarged prostate without lower urinary tract symptoms: Principal | ICD-10-CM

## 2023-10-24 DIAGNOSIS — N400 Enlarged prostate without lower urinary tract symptoms: Principal | ICD-10-CM

## 2023-10-24 DIAGNOSIS — E7800 Pure hypercholesterolemia, unspecified: Principal | ICD-10-CM

## 2023-10-24 DIAGNOSIS — E1169 Type 2 diabetes mellitus with other specified complication: Principal | ICD-10-CM

## 2023-10-24 DIAGNOSIS — I5022 Chronic systolic (congestive) heart failure: Principal | ICD-10-CM

## 2023-10-24 DIAGNOSIS — Z794 Long term (current) use of insulin: Principal | ICD-10-CM

## 2023-10-24 DIAGNOSIS — Z7982 Long term (current) use of aspirin: Principal | ICD-10-CM

## 2023-10-24 DIAGNOSIS — Z89512 Acquired absence of left leg below knee: Principal | ICD-10-CM

## 2023-10-24 DIAGNOSIS — I110 Hypertensive heart disease with heart failure: Principal | ICD-10-CM

## 2023-10-24 DIAGNOSIS — Z940 Kidney transplant status: Principal | ICD-10-CM

## 2023-10-24 DIAGNOSIS — M86172 Other acute osteomyelitis, left ankle and foot: Principal | ICD-10-CM

## 2023-10-24 DIAGNOSIS — Z955 Presence of coronary angioplasty implant and graft: Principal | ICD-10-CM

## 2023-10-24 DIAGNOSIS — L97529 Non-pressure chronic ulcer of other part of left foot with unspecified severity: Principal | ICD-10-CM

## 2023-10-24 DIAGNOSIS — Z89511 Acquired absence of right leg below knee: Principal | ICD-10-CM

## 2023-10-24 DIAGNOSIS — E1151 Type 2 diabetes mellitus with diabetic peripheral angiopathy without gangrene: Principal | ICD-10-CM

## 2023-10-24 DIAGNOSIS — K2950 Unspecified chronic gastritis without bleeding: Principal | ICD-10-CM

## 2023-10-24 DIAGNOSIS — K5900 Constipation, unspecified: Principal | ICD-10-CM

## 2023-10-24 DIAGNOSIS — I2510 Atherosclerotic heart disease of native coronary artery without angina pectoris: Principal | ICD-10-CM

## 2023-10-24 DIAGNOSIS — K219 Gastro-esophageal reflux disease without esophagitis: Principal | ICD-10-CM

## 2023-10-24 DIAGNOSIS — T8611 Kidney transplant rejection: Principal | ICD-10-CM

## 2023-10-25 DIAGNOSIS — K219 Gastro-esophageal reflux disease without esophagitis: Principal | ICD-10-CM

## 2023-10-25 DIAGNOSIS — N400 Enlarged prostate without lower urinary tract symptoms: Principal | ICD-10-CM

## 2023-10-25 DIAGNOSIS — L97529 Non-pressure chronic ulcer of other part of left foot with unspecified severity: Principal | ICD-10-CM

## 2023-10-25 DIAGNOSIS — E1151 Type 2 diabetes mellitus with diabetic peripheral angiopathy without gangrene: Principal | ICD-10-CM

## 2023-10-25 DIAGNOSIS — E7800 Pure hypercholesterolemia, unspecified: Principal | ICD-10-CM

## 2023-10-25 DIAGNOSIS — M86172 Other acute osteomyelitis, left ankle and foot: Principal | ICD-10-CM

## 2023-10-25 DIAGNOSIS — I5022 Chronic systolic (congestive) heart failure: Principal | ICD-10-CM

## 2023-10-25 DIAGNOSIS — Z7982 Long term (current) use of aspirin: Principal | ICD-10-CM

## 2023-10-25 DIAGNOSIS — T8611 Kidney transplant rejection: Principal | ICD-10-CM

## 2023-10-25 DIAGNOSIS — K2950 Unspecified chronic gastritis without bleeding: Principal | ICD-10-CM

## 2023-10-25 DIAGNOSIS — E1169 Type 2 diabetes mellitus with other specified complication: Principal | ICD-10-CM

## 2023-10-25 DIAGNOSIS — Z955 Presence of coronary angioplasty implant and graft: Principal | ICD-10-CM

## 2023-10-25 DIAGNOSIS — Z89511 Acquired absence of right leg below knee: Principal | ICD-10-CM

## 2023-10-25 DIAGNOSIS — I2510 Atherosclerotic heart disease of native coronary artery without angina pectoris: Principal | ICD-10-CM

## 2023-10-25 DIAGNOSIS — Z89512 Acquired absence of left leg below knee: Principal | ICD-10-CM

## 2023-10-25 DIAGNOSIS — I110 Hypertensive heart disease with heart failure: Principal | ICD-10-CM

## 2023-10-25 DIAGNOSIS — K5900 Constipation, unspecified: Principal | ICD-10-CM

## 2023-10-25 DIAGNOSIS — Z794 Long term (current) use of insulin: Principal | ICD-10-CM

## 2023-10-25 DIAGNOSIS — Z940 Kidney transplant status: Principal | ICD-10-CM

## 2023-10-26 DIAGNOSIS — Z794 Long term (current) use of insulin: Principal | ICD-10-CM

## 2023-10-26 DIAGNOSIS — E1169 Type 2 diabetes mellitus with other specified complication: Principal | ICD-10-CM

## 2023-10-26 DIAGNOSIS — Z7982 Long term (current) use of aspirin: Principal | ICD-10-CM

## 2023-10-26 DIAGNOSIS — N400 Enlarged prostate without lower urinary tract symptoms: Principal | ICD-10-CM

## 2023-10-26 DIAGNOSIS — I110 Hypertensive heart disease with heart failure: Principal | ICD-10-CM

## 2023-10-26 DIAGNOSIS — I5022 Chronic systolic (congestive) heart failure: Principal | ICD-10-CM

## 2023-10-26 DIAGNOSIS — K2950 Unspecified chronic gastritis without bleeding: Principal | ICD-10-CM

## 2023-10-26 DIAGNOSIS — E7800 Pure hypercholesterolemia, unspecified: Principal | ICD-10-CM

## 2023-10-26 DIAGNOSIS — Z89511 Acquired absence of right leg below knee: Principal | ICD-10-CM

## 2023-10-26 DIAGNOSIS — Z89512 Acquired absence of left leg below knee: Principal | ICD-10-CM

## 2023-10-26 DIAGNOSIS — Z955 Presence of coronary angioplasty implant and graft: Principal | ICD-10-CM

## 2023-10-26 DIAGNOSIS — M86172 Other acute osteomyelitis, left ankle and foot: Principal | ICD-10-CM

## 2023-10-26 DIAGNOSIS — K219 Gastro-esophageal reflux disease without esophagitis: Principal | ICD-10-CM

## 2023-10-26 DIAGNOSIS — I2510 Atherosclerotic heart disease of native coronary artery without angina pectoris: Principal | ICD-10-CM

## 2023-10-26 DIAGNOSIS — E1151 Type 2 diabetes mellitus with diabetic peripheral angiopathy without gangrene: Principal | ICD-10-CM

## 2023-10-26 DIAGNOSIS — Z940 Kidney transplant status: Principal | ICD-10-CM

## 2023-10-26 DIAGNOSIS — K5900 Constipation, unspecified: Principal | ICD-10-CM

## 2023-10-26 DIAGNOSIS — L97529 Non-pressure chronic ulcer of other part of left foot with unspecified severity: Principal | ICD-10-CM

## 2023-10-26 DIAGNOSIS — T8611 Kidney transplant rejection: Principal | ICD-10-CM

## 2023-10-26 MED ORDER — LOSARTAN 100 MG TABLET
ORAL_TABLET | Freq: Every day | ORAL | 0 refills | 30.00000 days | Status: CP
Start: 2023-10-26 — End: 2023-11-25

## 2023-10-27 DIAGNOSIS — Z89511 Acquired absence of right leg below knee: Principal | ICD-10-CM

## 2023-10-27 DIAGNOSIS — Z7982 Long term (current) use of aspirin: Principal | ICD-10-CM

## 2023-10-27 DIAGNOSIS — Z955 Presence of coronary angioplasty implant and graft: Principal | ICD-10-CM

## 2023-10-27 DIAGNOSIS — T8611 Kidney transplant rejection: Principal | ICD-10-CM

## 2023-10-27 DIAGNOSIS — M86172 Other acute osteomyelitis, left ankle and foot: Principal | ICD-10-CM

## 2023-10-27 DIAGNOSIS — E7800 Pure hypercholesterolemia, unspecified: Principal | ICD-10-CM

## 2023-10-27 DIAGNOSIS — I5022 Chronic systolic (congestive) heart failure: Principal | ICD-10-CM

## 2023-10-27 DIAGNOSIS — Z940 Kidney transplant status: Principal | ICD-10-CM

## 2023-10-27 DIAGNOSIS — N400 Enlarged prostate without lower urinary tract symptoms: Principal | ICD-10-CM

## 2023-10-27 DIAGNOSIS — L97529 Non-pressure chronic ulcer of other part of left foot with unspecified severity: Principal | ICD-10-CM

## 2023-10-27 DIAGNOSIS — E1169 Type 2 diabetes mellitus with other specified complication: Principal | ICD-10-CM

## 2023-10-27 DIAGNOSIS — I110 Hypertensive heart disease with heart failure: Principal | ICD-10-CM

## 2023-10-27 DIAGNOSIS — Z89512 Acquired absence of left leg below knee: Principal | ICD-10-CM

## 2023-10-27 DIAGNOSIS — E1151 Type 2 diabetes mellitus with diabetic peripheral angiopathy without gangrene: Principal | ICD-10-CM

## 2023-10-27 DIAGNOSIS — I2510 Atherosclerotic heart disease of native coronary artery without angina pectoris: Principal | ICD-10-CM

## 2023-10-27 DIAGNOSIS — K5900 Constipation, unspecified: Principal | ICD-10-CM

## 2023-10-27 DIAGNOSIS — Z794 Long term (current) use of insulin: Principal | ICD-10-CM

## 2023-10-27 DIAGNOSIS — K219 Gastro-esophageal reflux disease without esophagitis: Principal | ICD-10-CM

## 2023-10-27 DIAGNOSIS — K2950 Unspecified chronic gastritis without bleeding: Principal | ICD-10-CM

## 2023-10-28 DIAGNOSIS — R2689 Other abnormalities of gait and mobility: Secondary | ICD-10-CM | POA: Diagnosis not present

## 2023-10-28 DIAGNOSIS — T8611 Kidney transplant rejection: Principal | ICD-10-CM

## 2023-10-28 MED ORDER — CEFDINIR 300 MG CAPSULE
ORAL_CAPSULE | ORAL | 0 refills | 3.00000 days | Status: CP
Start: 2023-10-28 — End: 2023-10-31
  Filled 2023-10-27: qty 2, 2d supply, fill #0

## 2023-10-29 DIAGNOSIS — I2510 Atherosclerotic heart disease of native coronary artery without angina pectoris: Secondary | ICD-10-CM | POA: Diagnosis not present

## 2023-10-29 DIAGNOSIS — Z7982 Long term (current) use of aspirin: Secondary | ICD-10-CM | POA: Diagnosis not present

## 2023-10-29 DIAGNOSIS — I132 Hypertensive heart and chronic kidney disease with heart failure and with stage 5 chronic kidney disease, or end stage renal disease: Secondary | ICD-10-CM | POA: Diagnosis not present

## 2023-10-29 DIAGNOSIS — Z7902 Long term (current) use of antithrombotics/antiplatelets: Secondary | ICD-10-CM | POA: Diagnosis not present

## 2023-10-29 DIAGNOSIS — K5900 Constipation, unspecified: Secondary | ICD-10-CM | POA: Diagnosis not present

## 2023-10-29 DIAGNOSIS — N390 Urinary tract infection, site not specified: Secondary | ICD-10-CM | POA: Diagnosis not present

## 2023-10-29 DIAGNOSIS — B961 Klebsiella pneumoniae [K. pneumoniae] as the cause of diseases classified elsewhere: Secondary | ICD-10-CM | POA: Diagnosis not present

## 2023-10-29 DIAGNOSIS — Z89512 Acquired absence of left leg below knee: Secondary | ICD-10-CM | POA: Diagnosis not present

## 2023-10-29 DIAGNOSIS — E1151 Type 2 diabetes mellitus with diabetic peripheral angiopathy without gangrene: Secondary | ICD-10-CM | POA: Diagnosis not present

## 2023-10-29 DIAGNOSIS — Z5982 Transportation insecurity: Secondary | ICD-10-CM | POA: Diagnosis not present

## 2023-10-29 DIAGNOSIS — Z992 Dependence on renal dialysis: Secondary | ICD-10-CM | POA: Diagnosis not present

## 2023-10-29 DIAGNOSIS — Z89511 Acquired absence of right leg below knee: Secondary | ICD-10-CM | POA: Diagnosis not present

## 2023-10-29 DIAGNOSIS — I5022 Chronic systolic (congestive) heart failure: Secondary | ICD-10-CM | POA: Diagnosis not present

## 2023-10-29 DIAGNOSIS — E1122 Type 2 diabetes mellitus with diabetic chronic kidney disease: Secondary | ICD-10-CM | POA: Diagnosis not present

## 2023-10-29 DIAGNOSIS — I509 Heart failure, unspecified: Secondary | ICD-10-CM | POA: Diagnosis not present

## 2023-10-29 DIAGNOSIS — S88112D Complete traumatic amputation at level between knee and ankle, left lower leg, subsequent encounter: Secondary | ICD-10-CM | POA: Diagnosis not present

## 2023-10-29 DIAGNOSIS — Z4781 Encounter for orthopedic aftercare following surgical amputation: Secondary | ICD-10-CM | POA: Diagnosis not present

## 2023-10-29 DIAGNOSIS — E113593 Type 2 diabetes mellitus with proliferative diabetic retinopathy without macular edema, bilateral: Secondary | ICD-10-CM | POA: Diagnosis not present

## 2023-10-29 DIAGNOSIS — N186 End stage renal disease: Secondary | ICD-10-CM | POA: Diagnosis not present

## 2023-10-29 DIAGNOSIS — M6281 Muscle weakness (generalized): Secondary | ICD-10-CM | POA: Diagnosis not present

## 2023-10-29 DIAGNOSIS — Z940 Kidney transplant status: Secondary | ICD-10-CM | POA: Diagnosis not present

## 2023-10-29 DIAGNOSIS — Z8631 Personal history of diabetic foot ulcer: Secondary | ICD-10-CM | POA: Diagnosis not present

## 2023-10-29 DIAGNOSIS — T8611 Kidney transplant rejection: Principal | ICD-10-CM

## 2023-10-31 DIAGNOSIS — Z992 Dependence on renal dialysis: Secondary | ICD-10-CM | POA: Diagnosis not present

## 2023-10-31 DIAGNOSIS — Z5181 Encounter for therapeutic drug level monitoring: Secondary | ICD-10-CM | POA: Diagnosis not present

## 2023-10-31 DIAGNOSIS — Z79621 Long term (current) use of calcineurin inhibitor: Secondary | ICD-10-CM | POA: Diagnosis not present

## 2023-10-31 DIAGNOSIS — N186 End stage renal disease: Secondary | ICD-10-CM | POA: Diagnosis not present

## 2023-11-02 DIAGNOSIS — I2510 Atherosclerotic heart disease of native coronary artery without angina pectoris: Secondary | ICD-10-CM | POA: Diagnosis not present

## 2023-11-02 DIAGNOSIS — N390 Urinary tract infection, site not specified: Secondary | ICD-10-CM | POA: Diagnosis not present

## 2023-11-02 DIAGNOSIS — Z4781 Encounter for orthopedic aftercare following surgical amputation: Secondary | ICD-10-CM | POA: Diagnosis not present

## 2023-11-02 DIAGNOSIS — Z5982 Transportation insecurity: Secondary | ICD-10-CM | POA: Diagnosis not present

## 2023-11-02 DIAGNOSIS — Z940 Kidney transplant status: Secondary | ICD-10-CM | POA: Diagnosis not present

## 2023-11-02 DIAGNOSIS — S88112D Complete traumatic amputation at level between knee and ankle, left lower leg, subsequent encounter: Secondary | ICD-10-CM | POA: Diagnosis not present

## 2023-11-02 DIAGNOSIS — E113593 Type 2 diabetes mellitus with proliferative diabetic retinopathy without macular edema, bilateral: Secondary | ICD-10-CM | POA: Diagnosis not present

## 2023-11-02 DIAGNOSIS — Z89512 Acquired absence of left leg below knee: Secondary | ICD-10-CM | POA: Diagnosis not present

## 2023-11-02 DIAGNOSIS — Z992 Dependence on renal dialysis: Secondary | ICD-10-CM | POA: Diagnosis not present

## 2023-11-02 DIAGNOSIS — B961 Klebsiella pneumoniae [K. pneumoniae] as the cause of diseases classified elsewhere: Secondary | ICD-10-CM | POA: Diagnosis not present

## 2023-11-02 DIAGNOSIS — Z89511 Acquired absence of right leg below knee: Secondary | ICD-10-CM | POA: Diagnosis not present

## 2023-11-02 DIAGNOSIS — N186 End stage renal disease: Secondary | ICD-10-CM | POA: Diagnosis not present

## 2023-11-02 DIAGNOSIS — I509 Heart failure, unspecified: Secondary | ICD-10-CM | POA: Diagnosis not present

## 2023-11-02 DIAGNOSIS — I5022 Chronic systolic (congestive) heart failure: Secondary | ICD-10-CM | POA: Diagnosis not present

## 2023-11-02 DIAGNOSIS — Z7982 Long term (current) use of aspirin: Secondary | ICD-10-CM | POA: Diagnosis not present

## 2023-11-02 DIAGNOSIS — K5900 Constipation, unspecified: Secondary | ICD-10-CM | POA: Diagnosis not present

## 2023-11-02 DIAGNOSIS — E1151 Type 2 diabetes mellitus with diabetic peripheral angiopathy without gangrene: Secondary | ICD-10-CM | POA: Diagnosis not present

## 2023-11-02 DIAGNOSIS — I132 Hypertensive heart and chronic kidney disease with heart failure and with stage 5 chronic kidney disease, or end stage renal disease: Secondary | ICD-10-CM | POA: Diagnosis not present

## 2023-11-02 DIAGNOSIS — E1122 Type 2 diabetes mellitus with diabetic chronic kidney disease: Secondary | ICD-10-CM | POA: Diagnosis not present

## 2023-11-02 DIAGNOSIS — Z7902 Long term (current) use of antithrombotics/antiplatelets: Secondary | ICD-10-CM | POA: Diagnosis not present

## 2023-11-02 DIAGNOSIS — Z8631 Personal history of diabetic foot ulcer: Secondary | ICD-10-CM | POA: Diagnosis not present

## 2023-11-02 DIAGNOSIS — M6281 Muscle weakness (generalized): Secondary | ICD-10-CM | POA: Diagnosis not present

## 2023-11-03 DIAGNOSIS — B961 Klebsiella pneumoniae [K. pneumoniae] as the cause of diseases classified elsewhere: Secondary | ICD-10-CM | POA: Diagnosis not present

## 2023-11-03 DIAGNOSIS — Z4781 Encounter for orthopedic aftercare following surgical amputation: Secondary | ICD-10-CM | POA: Diagnosis not present

## 2023-11-03 DIAGNOSIS — I509 Heart failure, unspecified: Secondary | ICD-10-CM | POA: Diagnosis not present

## 2023-11-03 DIAGNOSIS — E1122 Type 2 diabetes mellitus with diabetic chronic kidney disease: Secondary | ICD-10-CM | POA: Diagnosis not present

## 2023-11-03 DIAGNOSIS — S88112D Complete traumatic amputation at level between knee and ankle, left lower leg, subsequent encounter: Secondary | ICD-10-CM | POA: Diagnosis not present

## 2023-11-03 DIAGNOSIS — Z7982 Long term (current) use of aspirin: Secondary | ICD-10-CM | POA: Diagnosis not present

## 2023-11-03 DIAGNOSIS — E1151 Type 2 diabetes mellitus with diabetic peripheral angiopathy without gangrene: Secondary | ICD-10-CM | POA: Diagnosis not present

## 2023-11-03 DIAGNOSIS — Z89512 Acquired absence of left leg below knee: Secondary | ICD-10-CM | POA: Diagnosis not present

## 2023-11-03 DIAGNOSIS — N390 Urinary tract infection, site not specified: Secondary | ICD-10-CM | POA: Diagnosis not present

## 2023-11-03 DIAGNOSIS — Z7902 Long term (current) use of antithrombotics/antiplatelets: Secondary | ICD-10-CM | POA: Diagnosis not present

## 2023-11-03 DIAGNOSIS — I132 Hypertensive heart and chronic kidney disease with heart failure and with stage 5 chronic kidney disease, or end stage renal disease: Secondary | ICD-10-CM | POA: Diagnosis not present

## 2023-11-03 DIAGNOSIS — E113593 Type 2 diabetes mellitus with proliferative diabetic retinopathy without macular edema, bilateral: Secondary | ICD-10-CM | POA: Diagnosis not present

## 2023-11-03 DIAGNOSIS — I5022 Chronic systolic (congestive) heart failure: Secondary | ICD-10-CM | POA: Diagnosis not present

## 2023-11-03 DIAGNOSIS — Z992 Dependence on renal dialysis: Secondary | ICD-10-CM | POA: Diagnosis not present

## 2023-11-03 DIAGNOSIS — Z5982 Transportation insecurity: Secondary | ICD-10-CM | POA: Diagnosis not present

## 2023-11-03 DIAGNOSIS — Z89511 Acquired absence of right leg below knee: Secondary | ICD-10-CM | POA: Diagnosis not present

## 2023-11-03 DIAGNOSIS — Z8631 Personal history of diabetic foot ulcer: Secondary | ICD-10-CM | POA: Diagnosis not present

## 2023-11-03 DIAGNOSIS — N186 End stage renal disease: Secondary | ICD-10-CM | POA: Diagnosis not present

## 2023-11-03 DIAGNOSIS — M6281 Muscle weakness (generalized): Secondary | ICD-10-CM | POA: Diagnosis not present

## 2023-11-03 DIAGNOSIS — Z940 Kidney transplant status: Secondary | ICD-10-CM | POA: Diagnosis not present

## 2023-11-03 DIAGNOSIS — I2510 Atherosclerotic heart disease of native coronary artery without angina pectoris: Secondary | ICD-10-CM | POA: Diagnosis not present

## 2023-11-03 DIAGNOSIS — K5900 Constipation, unspecified: Secondary | ICD-10-CM | POA: Diagnosis not present

## 2023-11-05 ENCOUNTER — Emergency Department
Admit: 2023-11-05 | Discharge: 2023-11-05 | Disposition: A | Discharge status: Home / Self Care | Payer: Medicare (Managed Care)

## 2023-11-05 DIAGNOSIS — Z7902 Long term (current) use of antithrombotics/antiplatelets: Secondary | ICD-10-CM | POA: Diagnosis not present

## 2023-11-05 DIAGNOSIS — Z79899 Other long term (current) drug therapy: Secondary | ICD-10-CM | POA: Diagnosis not present

## 2023-11-05 DIAGNOSIS — Z7982 Long term (current) use of aspirin: Secondary | ICD-10-CM | POA: Diagnosis not present

## 2023-11-05 DIAGNOSIS — E11319 Type 2 diabetes mellitus with unspecified diabetic retinopathy without macular edema: Secondary | ICD-10-CM | POA: Diagnosis not present

## 2023-11-05 DIAGNOSIS — I10 Essential (primary) hypertension: Secondary | ICD-10-CM | POA: Diagnosis not present

## 2023-11-05 DIAGNOSIS — L8990 Pressure ulcer of unspecified site, unspecified stage: Secondary | ICD-10-CM | POA: Diagnosis not present

## 2023-11-05 DIAGNOSIS — I110 Hypertensive heart disease with heart failure: Secondary | ICD-10-CM | POA: Diagnosis not present

## 2023-11-05 DIAGNOSIS — T8611 Kidney transplant rejection: Secondary | ICD-10-CM | POA: Diagnosis not present

## 2023-11-05 DIAGNOSIS — I509 Heart failure, unspecified: Secondary | ICD-10-CM | POA: Diagnosis not present

## 2023-11-05 DIAGNOSIS — X58XXXA Exposure to other specified factors, initial encounter: Secondary | ICD-10-CM | POA: Diagnosis not present

## 2023-11-05 DIAGNOSIS — E785 Hyperlipidemia, unspecified: Secondary | ICD-10-CM | POA: Diagnosis not present

## 2023-11-05 NOTE — ED Provider Notes (Signed)
 [facility] Emergency Department Provider Note    ED Clinical Impression     Diagnosis ICD-10-CM Associated Orders  1. Pressure injury of skin, unspecified injury stage, unspecified location  L89.90 Ambulatory referral to Home Health    Ambulatory referral to Home Health    2. Kidney transplant rejection (HHS-HCC)  T86.11     3. Type 2 diabetes mellitus with retinopathy, macular edema presence unspecified, unspecified laterality, unspecified retinopathy severity, unspecified whether long term insulin use     E11.319 Ambulatory referral to Diabetic Education          Impression, Medical Decision Making, Progress Notes and Critical Care    Impression, Differential Diagnosis and Plan of Care  Blake Torres is a 68 y.o. male with a past medical history of diabetes, hypertension, hyperlipidemia, kidney transplant, bilateral knee amputation, heart failure who presents to the ED for pain in his left and right buttock.  Initial vitals are within normal limits.  Patient is nontoxic on exam.  There is a small, well-healing decubitus ulcer with overlying scab on the right buttock.  There is no fluctuance or drainage.  Differential diagnosis includes but is not limited to decubitus ulcer, abscess, cellulitis.    Considered CBC to look for infection, however there are no systemic or local signs of infection noted on exam.  Decubitus ulcer is healing extremely well.  He will continue to use zinc at home.  He will also continue to take Tylenol  at home to control pain.  Case management was consulted.  Orders were completed for the patient to receive home aide, physical therapy, nursing assistance at home.  A home diabetic educator consult was also ordered as the patient does not have a glucometer at this time to monitor his blood sugar.  Patient has an appointment with his vascular surgeon next week.  He agrees to keep this appointment.  He will return to the emergency department for  worsening pain, fever greater than 100.4 F, vomiting, syncopal episodes.  We discussed this with the patient he is agreeable to this plan.  Will discharge home.  Additional MDM Elements     Discussion with other professionals: Case management  -   I have reviewed recent and relavant previous record, including: Inpatient notes - reviewed discharge summary from 10/27/23.   Social Determinants that significantly affected care: Need for assistance with ADLs.   Diagnostic tests considered but not performed: Considered additional imaging including US  and CT, however on exam decubitus ulcer is healing extremely well. There are no signs of infection.    Portions of this record have been created using Scientist, clinical (histocompatibility and immunogenetics). Dictation errors have been sought, but may not have been identified and corrected.  See chart and nursing documentation for additional ED course details.  ____________________________________________      History     Reason for Visit Abscess   HPI  Blake Torres is a 68 y.o. male with a past medical history of diabetes, hypertension, hyperlipidemia, kidney transplant, bilateral knee amputation, heart failure who presents to the ED for pain in his buttock.  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  He was discharged on 10/27/23.  Patient states that he had a pressure ulcer at that time of discharge.  Patient states he was discharged home and has had a friend helping him with his activities of daily living.  Per patient, home health came 2 days ago and told him  he had a pressure ulcer on his buttock.  He complains of pain with sitting.  Denies fever, chills, nausea, vomiting, urinary symptoms, abdominal pain.  Patient states he has been using zinc on his pressure ulcer which has helped with pain control.  He also takes Tylenol  at home and feels his pain is well-controlled now.  Denies any pain at this time.  Patient states that  he is not getting physical therapy or Occupational therapy. Patient also states that he is not getting regular home health.   Outside Historian(s) Daughter.   Past Medical History[1]  Past Surgical History[2]  No current facility-administered medications for this encounter.  Current Outpatient Medications:  .  acetaminophen  (TYLENOL ) 500 MG tablet, Take 2 tablets (1,000 mg total) by mouth every eight (8) hours as directed by provider, Disp: 30 tablet, Rfl: 1 .  aspirin 81 mg Tab, Take 1 tablet by mouth in the morning., Disp: , Rfl:  .  atorvastatin (LIPITOR) 40 MG tablet, Take 1 tablet (40 mg total) by mouth daily., Disp: 90 tablet, Rfl: 0 .  blood sugar diagnostic (GLUCOSE BLOOD) Strp, Check blood sugar as directed once a day and for symptoms of high or low blood sugar., Disp: 100 strip, Rfl: 0 .  blood-glucose meter kit, Use as instructed, Disp: 1 each, Rfl: 0 .  carvedilol (COREG) 25 MG tablet, Take 1 tablet (25 mg total) by mouth two (2) times a day., Disp: 180 tablet, Rfl: 0 .  clopidogrel (PLAVIX) 75 mg tablet, Take 1 tablet (75 mg total) by mouth daily., Disp: 30 tablet, Rfl: 2 .  docusate sodium (COLACE) 100 MG capsule, Take 1 capsule (100 mg total) by mouth daily., Disp: , Rfl:  .  DULoxetine (CYMBALTA) 30 MG capsule, Take 1 capsule (30 mg total) by mouth two (2) times a day., Disp: 60 capsule, Rfl: 2 .  ezetimibe (ZETIA) 10 mg tablet, Take 1 tablet (10 mg total) by mouth nightly., Disp: 30 tablet, Rfl: 11 .  furosemide (LASIX) 80 MG tablet, TAKE 1 TABLET BY MOUTH EVERY DAY, Disp: 90 tablet, Rfl: 3 .  lancets Misc, Check blood sugar as directed once a day and for symptoms of high or low blood sugar., Disp: 100 each, Rfl: 0 .  losartan (COZAAR) 100 MG tablet, Take 1 tablet (100 mg total) by mouth daily., Disp: 30 tablet, Rfl: 0 .  nitroglycerin (NITROSTAT) 0.4 MG SL tablet, Place 1 tablet (0.4 mg total) under the tongue every five (5) minutes as needed for chest pain. Maximum of 3  doses in 15 minutes., Disp: , Rfl:  .  pantoprazole (PROTONIX) 40 MG tablet, Take 1 tablet (40 mg total) by mouth daily., Disp: 30 tablet, Rfl: 11 .  polyethylene glycol (MIRALAX) 17 gram packet, Take 17 g by mouth daily., Disp: , Rfl:  .  sevelamer (RENVELA) 800 mg tablet, Take 1 tablet (800 mg total) by mouth Three (3) times a day with a meal., Disp: 90 tablet, Rfl: 0 .  spironolactone (ALDACTONE) 25 MG tablet, Take 1 tablet (25 mg total) by mouth daily., Disp: 90 tablet, Rfl: 0 .  tamsulosin (FLOMAX) 0.4 mg capsule, Take 1 capsule (0.4 mg total) by mouth daily., Disp: 90 capsule, Rfl: 3  Allergies Amlodipine and Pollen extracts  Family History[3]  Short Social History[4]    Physical Exam   ED Triage Vitals [11/05/23 0755]  Enc Vitals Group     BP 178/73     Pulse 64     SpO2 Pulse  Resp 16     Temp 36.1 C (97 F)     Temp src      SpO2 99 %     Weight 77.6 kg (171 lb)     Height      Head Circumference      Peak Flow      Pain Score      Pain Loc      Pain Education      Exclude from Growth Chart     Constitutional: Alert and oriented. Well appearing and in no distress. Eyes: Conjunctivae are normal. ENT      Head: Normocephalic and atraumatic.      Nose: No congestion.      Mouth/Throat: Mucous membranes are moist.      Neck: No stridor. Hematological/Lymphatic/Immunilogical: No cervical lymphadenopathy. Cardiovascular: Normal rate, regular rhythm. Normal and symmetric distal pulses are present in all extremities. Respiratory: Normal respiratory effort. Breath sounds are normal. Gastrointestinal: Soft and nontender. There is no CVA tenderness. Musculoskeletal: Normal range of motion in all extremities.      Right lower leg: BKA, No tenderness or edema.      Left lower leg: BKA, No tenderness or edema.  Well-healing scar staples still present.  No erythema or exudate. Neurologic: Normal speech and language. No gross focal neurologic deficits are  appreciated. Skin: Skin is warm, dry and intact. 1x2cm well-healing decubitus ulcer of right buttock.  No fluctuance, exudate or erythema.  Image attached below. Psychiatric: Mood and affect are normal. Speech and behavior are normal.    Radiology   No orders to display     Procedures including Critical Care       Herminio Lauraine MATSU, DO Resident 11/05/23 1816      [1] Past Medical History: Diagnosis Date  . Arthritis   . Cataract    Cataract OD; CE/PCIOL OS 12/13/12 with history of prolonged postoperative inflammation despite Pred Forte.    . Diabetes mellitus       Type II  . Diabetic retinopathy       PDR OU s/p PRP OU 2004 with fill-in PRP OD 02/09/11. No medications taken for Diabetes s/p kidney transplant in 2008.  . Dry eyes   . GERD (gastroesophageal reflux disease)   . Hypercholesteremia   . Hypertension   . Kidney transplanted (HHS-HCC)   . TIA (transient ischemic attack)   . VH (vitreous hemorrhage), bilateral (CMS-HCC)   [2] Past Surgical History: Procedure Laterality Date  . CHOLECYSTECTOMY    . NEPHRECTOMY TRANSPLANTED ORGAN    . PR AMPUTATION LOW LEG THRU TIB/FIB Right 02/03/2023   Procedure: AMPUTA LEG THRU TIBIA & FIBULA;  Surgeon: Debarah Lang Manis, MD;  Location: OR [facility];  Service: Vascular  . PR AMPUTATION LOW LEG THRU TIB/FIB Left 09/30/2023   Procedure: AMPUTA LEG THRU TIBIA & FIBULA;  Surgeon: Debarah Lang Manis, MD;  Location: OR [facility];  Service: Vascular  . PR AMPUTATION METATARSAL+TOE,SINGLE Left 09/15/2023   Procedure: AMPUTATION, METATARSAL, WITH TOE SINGLE;  Surgeon: Conception Garnette Bruckner, DPM;  Location: OR [facility];  Service: Vascular  . PR CATH PLACE/CORON ANGIO, IMG SUPER/INTERP,W LEFT HEART VENTRICULOGRAPHY N/A 01/24/2023   Procedure: Left Heart Catheterization With Intervention;  Surgeon: Fernande Reyes Satterfield, MD;  Location: [facility] CATH;  Service: Cardiology  . PR CATH PLACE/CORON ANGIO, IMG SUPER/INTERP,W LEFT HEART  VENTRICULOGRAPHY N/A 01/24/2023   Procedure: Left Heart Catheterization With Intervention;  Surgeon: Freddy Blinks, MD;  Location: [facility] CATH;  Service: Cardiology  .  PR COLONOSCOPY FLX DX W/COLLJ SPEC WHEN PFRMD N/A 10/14/2014   Procedure: COLONOSCOPY, FLEXIBLE, PROXIMAL TO SPLENIC FLEXURE; DIAGNOSTIC, W/WO COLLECTION SPECIMEN BY BRUSH OR WASH;  Surgeon: Evern Dao, MD;  [facility];  Service: Gastroenterology  . PR COLONOSCOPY FLX DX W/COLLJ SPEC WHEN PFRMD N/A 03/05/2021   Procedure: COLONOSCOPY, FLEXIBLE, PROXIMAL TO SPLENIC FLEXURE; DIAGNOSTIC, W/WO COLLECTION SPECIMEN BY BRUSH OR WASH;  Surgeon: Lauraine Burnard Dry, MD;  Location: [facility] PROCEDURES [facility];  Service: Gastroenterology  . PR COLONOSCOPY FLX DX W/COLLJ SPEC WHEN PFRMD Left 12/20/2022   Procedure: COLONOSCOPY, FLEXIBLE, PROXIMAL TO SPLENIC FLEXURE; DIAGNOSTIC, W/WO COLLECTION SPECIMEN BY BRUSH OR WASH;  Surgeon: Gwenyth Prentice Agent, MD;  Location: GI PROCEDURES [facility];  Service: Gastroenterology  . PR COLONOSCOPY W/BIOPSY SINGLE/MULTIPLE N/A 03/05/2021   Procedure: COLONOSCOPY, FLEXIBLE, PROXIMAL TO SPLENIC FLEXURE; WITH BIOPSY, SINGLE OR MULTIPLE;  Surgeon: Lauraine Burnard Dry, MD;  Location: [facility] PROCEDURES [facility];  Service: Gastroenterology  . PR COLSC FLX W/REMOVAL LESION BY HOT BX FORCEPS N/A 03/05/2021   Procedure: COLONOSCOPY, FLEXIBLE, PROXIMAL TO SPLENIC FLEXURE; W/REMOVAL TUMOR/POLYP/OTHER LESION, HOT BX FORCEP/CAUTE;  Surgeon: Lauraine Burnard Dry, MD;  Location: [facility] GI PROCEDURES [facility];  Service: Gastroenterology  . PR COLSC FLX W/RMVL OF TUMOR POLYP LESION SNARE TQ N/A 03/05/2021   Procedure: COLONOSCOPY FLEX; W/REMOV TUMOR/LES BY SNARE;  Surgeon: Lauraine Burnard Dry, MD;  Location: [facility] PROCED[facility];  Service: Gastroenterology  . PR EYE SURG POST SGMT PROC UNLISTED Left 06/01/10   PPV/EL/MP  . PR TRABECULOPLASTY BY LASER SURGERY Bilateral    PRP OD 2004, #387 (02/09/11), #1129  (10/14/11); PRP OS 2004, 2013 with PPV    . PR UPPER GI ENDOSCOPY,BIOPSY Left 12/20/2022   Procedure: UGI ENDOSCOPY; WITH BIOPSY, SINGLE OR MULTIPLE;  Surgeon: Gwenyth Prentice Agent, MD;  Location: GI PROCEDURES [facility];  Service: Gastroenterology  . PR XCAPSL CTRC RMVL INSJ IO LENS PROSTH W/O ECP Left 12/13/2012   Procedure: EXTRACAPSULAR CATARACT REMOVAL W/INSERTION OF INTRAOCULAR LENS PROSTHESIS, MANUAL OR MECHANICAL TECHNIQUE OS ZCB00 22.5 and [id number] 22.0 and 21.5; 0.7 mm phaco with gold sleeve, 1.1 mm slit knife, 2.2 mm keratome;  Surgeon: Vinie LITTIE Schneider, MD;  Location: [facility] OR [facility];  Service: Ophthalmology  . YAG CAPSULOTOMY - OS - LEFT EYE Left 12/2014   PCO OS - sp YAG  Capsulotomy August 2016.   [3] Family History Problem Relation Age of Onset  . Diabetes Mother   . Diabetes Father   . Heart disease Father   . Diabetes Sister   . No Known Problems Brother   . No Known Problems Maternal Aunt   . No Known Problems Maternal Uncle   . No Known Problems Paternal Aunt   . No Known Problems Paternal Uncle   . No Known Problems Maternal Grandmother   . No Known Problems Maternal Grandfather   . No Known Problems Paternal Grandmother   . No Known Problems Paternal Grandfather   . No Known Problems Other   . Glaucoma Neg Hx   . Retinal detachment Neg Hx   . Strabismus Neg Hx   . Macular degeneration Neg Hx   . Amblyopia Neg Hx   . Blindness Neg Hx   . Cancer Neg Hx   . Cataracts Neg Hx   . Hypertension Neg Hx   . Stroke Neg Hx   . Thyroid disease Neg Hx   [4] Social History Tobacco Use  . Smoking status: Never  Passive exposure: Never  . Smokeless tobacco: Never  Vaping Use  . Vaping status: Never Used  Substance Use Topics  . Alcohol use: No  . Drug use: No

## 2023-11-08 DIAGNOSIS — E1122 Type 2 diabetes mellitus with diabetic chronic kidney disease: Secondary | ICD-10-CM | POA: Diagnosis not present

## 2023-11-08 DIAGNOSIS — E1151 Type 2 diabetes mellitus with diabetic peripheral angiopathy without gangrene: Secondary | ICD-10-CM | POA: Diagnosis not present

## 2023-11-08 DIAGNOSIS — Z940 Kidney transplant status: Secondary | ICD-10-CM | POA: Diagnosis not present

## 2023-11-08 DIAGNOSIS — Z89511 Acquired absence of right leg below knee: Secondary | ICD-10-CM | POA: Diagnosis not present

## 2023-11-08 DIAGNOSIS — N390 Urinary tract infection, site not specified: Secondary | ICD-10-CM | POA: Diagnosis not present

## 2023-11-08 DIAGNOSIS — Z7982 Long term (current) use of aspirin: Secondary | ICD-10-CM | POA: Diagnosis not present

## 2023-11-08 DIAGNOSIS — S88112D Complete traumatic amputation at level between knee and ankle, left lower leg, subsequent encounter: Secondary | ICD-10-CM | POA: Diagnosis not present

## 2023-11-08 DIAGNOSIS — I5022 Chronic systolic (congestive) heart failure: Secondary | ICD-10-CM | POA: Diagnosis not present

## 2023-11-08 DIAGNOSIS — Z4781 Encounter for orthopedic aftercare following surgical amputation: Secondary | ICD-10-CM | POA: Diagnosis not present

## 2023-11-08 DIAGNOSIS — N186 End stage renal disease: Secondary | ICD-10-CM | POA: Diagnosis not present

## 2023-11-08 DIAGNOSIS — I509 Heart failure, unspecified: Secondary | ICD-10-CM | POA: Diagnosis not present

## 2023-11-08 DIAGNOSIS — E113593 Type 2 diabetes mellitus with proliferative diabetic retinopathy without macular edema, bilateral: Secondary | ICD-10-CM | POA: Diagnosis not present

## 2023-11-08 DIAGNOSIS — I132 Hypertensive heart and chronic kidney disease with heart failure and with stage 5 chronic kidney disease, or end stage renal disease: Secondary | ICD-10-CM | POA: Diagnosis not present

## 2023-11-08 DIAGNOSIS — K5900 Constipation, unspecified: Secondary | ICD-10-CM | POA: Diagnosis not present

## 2023-11-08 DIAGNOSIS — M6281 Muscle weakness (generalized): Secondary | ICD-10-CM | POA: Diagnosis not present

## 2023-11-08 DIAGNOSIS — B961 Klebsiella pneumoniae [K. pneumoniae] as the cause of diseases classified elsewhere: Secondary | ICD-10-CM | POA: Diagnosis not present

## 2023-11-08 DIAGNOSIS — Z7902 Long term (current) use of antithrombotics/antiplatelets: Secondary | ICD-10-CM | POA: Diagnosis not present

## 2023-11-08 DIAGNOSIS — Z8631 Personal history of diabetic foot ulcer: Secondary | ICD-10-CM | POA: Diagnosis not present

## 2023-11-08 DIAGNOSIS — Z89512 Acquired absence of left leg below knee: Secondary | ICD-10-CM | POA: Diagnosis not present

## 2023-11-08 DIAGNOSIS — Z5982 Transportation insecurity: Secondary | ICD-10-CM | POA: Diagnosis not present

## 2023-11-08 DIAGNOSIS — I2510 Atherosclerotic heart disease of native coronary artery without angina pectoris: Secondary | ICD-10-CM | POA: Diagnosis not present

## 2023-11-08 DIAGNOSIS — Z992 Dependence on renal dialysis: Secondary | ICD-10-CM | POA: Diagnosis not present

## 2023-11-09 ENCOUNTER — Ambulatory Visit
Admit: 2023-11-09 | Discharge: 2023-11-10 | Payer: Medicare (Managed Care) | Attending: Student in an Organized Health Care Education/Training Program | Primary: Student in an Organized Health Care Education/Training Program

## 2023-11-09 ENCOUNTER — Inpatient Hospital Stay: Admit: 2023-11-09 | Discharge: 2023-11-10 | Payer: Medicare (Managed Care)

## 2023-11-09 DIAGNOSIS — E785 Hyperlipidemia, unspecified: Secondary | ICD-10-CM | POA: Diagnosis not present

## 2023-11-09 DIAGNOSIS — I10 Essential (primary) hypertension: Secondary | ICD-10-CM | POA: Diagnosis not present

## 2023-11-09 DIAGNOSIS — E1151 Type 2 diabetes mellitus with diabetic peripheral angiopathy without gangrene: Secondary | ICD-10-CM | POA: Diagnosis not present

## 2023-11-09 DIAGNOSIS — Z89512 Acquired absence of left leg below knee: Secondary | ICD-10-CM | POA: Diagnosis not present

## 2023-11-09 DIAGNOSIS — N184 Chronic kidney disease, stage 4 (severe): Secondary | ICD-10-CM | POA: Diagnosis not present

## 2023-11-09 DIAGNOSIS — L89319 Pressure ulcer of right buttock, unspecified stage: Secondary | ICD-10-CM | POA: Diagnosis not present

## 2023-11-09 DIAGNOSIS — Z89511 Acquired absence of right leg below knee: Secondary | ICD-10-CM | POA: Diagnosis not present

## 2023-11-09 DIAGNOSIS — K219 Gastro-esophageal reflux disease without esophagitis: Secondary | ICD-10-CM | POA: Diagnosis not present

## 2023-11-09 DIAGNOSIS — E1122 Type 2 diabetes mellitus with diabetic chronic kidney disease: Secondary | ICD-10-CM | POA: Diagnosis not present

## 2023-11-09 DIAGNOSIS — E11319 Type 2 diabetes mellitus with unspecified diabetic retinopathy without macular edema: Secondary | ICD-10-CM | POA: Diagnosis not present

## 2023-11-09 DIAGNOSIS — E119 Type 2 diabetes mellitus without complications: Secondary | ICD-10-CM | POA: Diagnosis not present

## 2023-11-09 DIAGNOSIS — I5032 Chronic diastolic (congestive) heart failure: Secondary | ICD-10-CM | POA: Diagnosis not present

## 2023-11-09 DIAGNOSIS — I252 Old myocardial infarction: Secondary | ICD-10-CM | POA: Diagnosis not present

## 2023-11-09 DIAGNOSIS — I130 Hypertensive heart and chronic kidney disease with heart failure and stage 1 through stage 4 chronic kidney disease, or unspecified chronic kidney disease: Secondary | ICD-10-CM | POA: Diagnosis not present

## 2023-11-09 DIAGNOSIS — I739 Peripheral vascular disease, unspecified: Secondary | ICD-10-CM | POA: Diagnosis not present

## 2023-11-09 DIAGNOSIS — Z4781 Encounter for orthopedic aftercare following surgical amputation: Secondary | ICD-10-CM | POA: Diagnosis not present

## 2023-11-10 DIAGNOSIS — Z940 Kidney transplant status: Secondary | ICD-10-CM | POA: Diagnosis not present

## 2023-11-10 DIAGNOSIS — M6281 Muscle weakness (generalized): Secondary | ICD-10-CM | POA: Diagnosis not present

## 2023-11-10 DIAGNOSIS — Z992 Dependence on renal dialysis: Secondary | ICD-10-CM | POA: Diagnosis not present

## 2023-11-10 DIAGNOSIS — Z89511 Acquired absence of right leg below knee: Secondary | ICD-10-CM | POA: Diagnosis not present

## 2023-11-10 DIAGNOSIS — E1122 Type 2 diabetes mellitus with diabetic chronic kidney disease: Secondary | ICD-10-CM | POA: Diagnosis not present

## 2023-11-10 DIAGNOSIS — S88112D Complete traumatic amputation at level between knee and ankle, left lower leg, subsequent encounter: Secondary | ICD-10-CM | POA: Diagnosis not present

## 2023-11-10 DIAGNOSIS — I509 Heart failure, unspecified: Secondary | ICD-10-CM | POA: Diagnosis not present

## 2023-11-10 DIAGNOSIS — Z4781 Encounter for orthopedic aftercare following surgical amputation: Secondary | ICD-10-CM | POA: Diagnosis not present

## 2023-11-10 DIAGNOSIS — Z7902 Long term (current) use of antithrombotics/antiplatelets: Secondary | ICD-10-CM | POA: Diagnosis not present

## 2023-11-10 DIAGNOSIS — Z89512 Acquired absence of left leg below knee: Secondary | ICD-10-CM | POA: Diagnosis not present

## 2023-11-10 DIAGNOSIS — I132 Hypertensive heart and chronic kidney disease with heart failure and with stage 5 chronic kidney disease, or end stage renal disease: Secondary | ICD-10-CM | POA: Diagnosis not present

## 2023-11-10 DIAGNOSIS — Z7982 Long term (current) use of aspirin: Secondary | ICD-10-CM | POA: Diagnosis not present

## 2023-11-10 DIAGNOSIS — Z8631 Personal history of diabetic foot ulcer: Secondary | ICD-10-CM | POA: Diagnosis not present

## 2023-11-10 DIAGNOSIS — N186 End stage renal disease: Secondary | ICD-10-CM | POA: Diagnosis not present

## 2023-11-10 DIAGNOSIS — Z5982 Transportation insecurity: Secondary | ICD-10-CM | POA: Diagnosis not present

## 2023-11-10 DIAGNOSIS — I2510 Atherosclerotic heart disease of native coronary artery without angina pectoris: Secondary | ICD-10-CM | POA: Diagnosis not present

## 2023-11-10 DIAGNOSIS — N390 Urinary tract infection, site not specified: Secondary | ICD-10-CM | POA: Diagnosis not present

## 2023-11-10 DIAGNOSIS — B961 Klebsiella pneumoniae [K. pneumoniae] as the cause of diseases classified elsewhere: Secondary | ICD-10-CM | POA: Diagnosis not present

## 2023-11-10 DIAGNOSIS — K5900 Constipation, unspecified: Secondary | ICD-10-CM | POA: Diagnosis not present

## 2023-11-10 DIAGNOSIS — E113593 Type 2 diabetes mellitus with proliferative diabetic retinopathy without macular edema, bilateral: Secondary | ICD-10-CM | POA: Diagnosis not present

## 2023-11-10 DIAGNOSIS — E1151 Type 2 diabetes mellitus with diabetic peripheral angiopathy without gangrene: Secondary | ICD-10-CM | POA: Diagnosis not present

## 2023-11-10 DIAGNOSIS — I5022 Chronic systolic (congestive) heart failure: Secondary | ICD-10-CM | POA: Diagnosis not present

## 2023-11-15 DIAGNOSIS — I132 Hypertensive heart and chronic kidney disease with heart failure and with stage 5 chronic kidney disease, or end stage renal disease: Secondary | ICD-10-CM | POA: Diagnosis not present

## 2023-11-15 DIAGNOSIS — N390 Urinary tract infection, site not specified: Secondary | ICD-10-CM | POA: Diagnosis not present

## 2023-11-15 DIAGNOSIS — I5022 Chronic systolic (congestive) heart failure: Secondary | ICD-10-CM | POA: Diagnosis not present

## 2023-11-15 DIAGNOSIS — I509 Heart failure, unspecified: Secondary | ICD-10-CM | POA: Diagnosis not present

## 2023-11-15 DIAGNOSIS — N186 End stage renal disease: Secondary | ICD-10-CM | POA: Diagnosis not present

## 2023-11-15 DIAGNOSIS — Z940 Kidney transplant status: Secondary | ICD-10-CM | POA: Diagnosis not present

## 2023-11-15 DIAGNOSIS — E113593 Type 2 diabetes mellitus with proliferative diabetic retinopathy without macular edema, bilateral: Secondary | ICD-10-CM | POA: Diagnosis not present

## 2023-11-15 DIAGNOSIS — B961 Klebsiella pneumoniae [K. pneumoniae] as the cause of diseases classified elsewhere: Secondary | ICD-10-CM | POA: Diagnosis not present

## 2023-11-15 DIAGNOSIS — Z992 Dependence on renal dialysis: Secondary | ICD-10-CM | POA: Diagnosis not present

## 2023-11-15 DIAGNOSIS — E1122 Type 2 diabetes mellitus with diabetic chronic kidney disease: Secondary | ICD-10-CM | POA: Diagnosis not present

## 2023-11-15 DIAGNOSIS — Z8631 Personal history of diabetic foot ulcer: Secondary | ICD-10-CM | POA: Diagnosis not present

## 2023-11-15 DIAGNOSIS — Z5982 Transportation insecurity: Secondary | ICD-10-CM | POA: Diagnosis not present

## 2023-11-15 DIAGNOSIS — M6281 Muscle weakness (generalized): Secondary | ICD-10-CM | POA: Diagnosis not present

## 2023-11-15 DIAGNOSIS — Z89512 Acquired absence of left leg below knee: Secondary | ICD-10-CM | POA: Diagnosis not present

## 2023-11-15 DIAGNOSIS — Z4781 Encounter for orthopedic aftercare following surgical amputation: Secondary | ICD-10-CM | POA: Diagnosis not present

## 2023-11-15 DIAGNOSIS — E1151 Type 2 diabetes mellitus with diabetic peripheral angiopathy without gangrene: Secondary | ICD-10-CM | POA: Diagnosis not present

## 2023-11-15 DIAGNOSIS — K5900 Constipation, unspecified: Secondary | ICD-10-CM | POA: Diagnosis not present

## 2023-11-15 DIAGNOSIS — I2510 Atherosclerotic heart disease of native coronary artery without angina pectoris: Secondary | ICD-10-CM | POA: Diagnosis not present

## 2023-11-15 DIAGNOSIS — Z89511 Acquired absence of right leg below knee: Secondary | ICD-10-CM | POA: Diagnosis not present

## 2023-11-15 DIAGNOSIS — Z7982 Long term (current) use of aspirin: Secondary | ICD-10-CM | POA: Diagnosis not present

## 2023-11-15 DIAGNOSIS — S88112D Complete traumatic amputation at level between knee and ankle, left lower leg, subsequent encounter: Secondary | ICD-10-CM | POA: Diagnosis not present

## 2023-11-15 DIAGNOSIS — Z7902 Long term (current) use of antithrombotics/antiplatelets: Secondary | ICD-10-CM | POA: Diagnosis not present

## 2023-11-15 DIAGNOSIS — E11319 Type 2 diabetes mellitus with unspecified diabetic retinopathy without macular edema: Principal | ICD-10-CM

## 2023-11-15 DIAGNOSIS — T8611 Kidney transplant rejection: Principal | ICD-10-CM

## 2023-11-17 DIAGNOSIS — I509 Heart failure, unspecified: Secondary | ICD-10-CM | POA: Diagnosis not present

## 2023-11-17 DIAGNOSIS — E11622 Type 2 diabetes mellitus with other skin ulcer: Secondary | ICD-10-CM | POA: Diagnosis not present

## 2023-11-17 DIAGNOSIS — E113593 Type 2 diabetes mellitus with proliferative diabetic retinopathy without macular edema, bilateral: Secondary | ICD-10-CM | POA: Diagnosis not present

## 2023-11-17 DIAGNOSIS — E1151 Type 2 diabetes mellitus with diabetic peripheral angiopathy without gangrene: Secondary | ICD-10-CM | POA: Diagnosis not present

## 2023-11-17 DIAGNOSIS — Z89511 Acquired absence of right leg below knee: Secondary | ICD-10-CM | POA: Diagnosis not present

## 2023-11-17 DIAGNOSIS — I130 Hypertensive heart and chronic kidney disease with heart failure and stage 1 through stage 4 chronic kidney disease, or unspecified chronic kidney disease: Secondary | ICD-10-CM | POA: Diagnosis not present

## 2023-11-17 DIAGNOSIS — I132 Hypertensive heart and chronic kidney disease with heart failure and with stage 5 chronic kidney disease, or end stage renal disease: Secondary | ICD-10-CM | POA: Diagnosis not present

## 2023-11-17 DIAGNOSIS — L8990 Pressure ulcer of unspecified site, unspecified stage: Secondary | ICD-10-CM | POA: Diagnosis not present

## 2023-11-17 DIAGNOSIS — Z7902 Long term (current) use of antithrombotics/antiplatelets: Secondary | ICD-10-CM | POA: Diagnosis not present

## 2023-11-17 DIAGNOSIS — E1122 Type 2 diabetes mellitus with diabetic chronic kidney disease: Secondary | ICD-10-CM | POA: Diagnosis not present

## 2023-11-17 DIAGNOSIS — Z89512 Acquired absence of left leg below knee: Secondary | ICD-10-CM | POA: Diagnosis not present

## 2023-11-17 DIAGNOSIS — I739 Peripheral vascular disease, unspecified: Secondary | ICD-10-CM | POA: Diagnosis not present

## 2023-11-17 DIAGNOSIS — M6281 Muscle weakness (generalized): Secondary | ICD-10-CM | POA: Diagnosis not present

## 2023-11-17 DIAGNOSIS — Z7982 Long term (current) use of aspirin: Secondary | ICD-10-CM | POA: Diagnosis not present

## 2023-11-17 DIAGNOSIS — E11319 Type 2 diabetes mellitus with unspecified diabetic retinopathy without macular edema: Secondary | ICD-10-CM | POA: Diagnosis not present

## 2023-11-17 DIAGNOSIS — Z8631 Personal history of diabetic foot ulcer: Secondary | ICD-10-CM | POA: Diagnosis not present

## 2023-11-17 DIAGNOSIS — Z992 Dependence on renal dialysis: Secondary | ICD-10-CM | POA: Diagnosis not present

## 2023-11-17 DIAGNOSIS — S88112D Complete traumatic amputation at level between knee and ankle, left lower leg, subsequent encounter: Secondary | ICD-10-CM | POA: Diagnosis not present

## 2023-11-17 DIAGNOSIS — I2510 Atherosclerotic heart disease of native coronary artery without angina pectoris: Secondary | ICD-10-CM | POA: Diagnosis not present

## 2023-11-17 DIAGNOSIS — Z940 Kidney transplant status: Secondary | ICD-10-CM | POA: Diagnosis not present

## 2023-11-17 DIAGNOSIS — Z0000 Encounter for general adult medical examination without abnormal findings: Secondary | ICD-10-CM | POA: Diagnosis not present

## 2023-11-17 DIAGNOSIS — N390 Urinary tract infection, site not specified: Secondary | ICD-10-CM | POA: Diagnosis not present

## 2023-11-17 DIAGNOSIS — B961 Klebsiella pneumoniae [K. pneumoniae] as the cause of diseases classified elsewhere: Secondary | ICD-10-CM | POA: Diagnosis not present

## 2023-11-17 DIAGNOSIS — K5900 Constipation, unspecified: Secondary | ICD-10-CM | POA: Diagnosis not present

## 2023-11-17 DIAGNOSIS — E1142 Type 2 diabetes mellitus with diabetic polyneuropathy: Secondary | ICD-10-CM | POA: Diagnosis not present

## 2023-11-17 DIAGNOSIS — Z5982 Transportation insecurity: Secondary | ICD-10-CM | POA: Diagnosis not present

## 2023-11-17 DIAGNOSIS — S88111D Complete traumatic amputation at level between knee and ankle, right lower leg, subsequent encounter: Secondary | ICD-10-CM | POA: Diagnosis not present

## 2023-11-17 DIAGNOSIS — N184 Chronic kidney disease, stage 4 (severe): Secondary | ICD-10-CM | POA: Diagnosis not present

## 2023-11-17 DIAGNOSIS — N186 End stage renal disease: Secondary | ICD-10-CM | POA: Diagnosis not present

## 2023-11-17 DIAGNOSIS — Z4781 Encounter for orthopedic aftercare following surgical amputation: Secondary | ICD-10-CM | POA: Diagnosis not present

## 2023-11-17 DIAGNOSIS — I5022 Chronic systolic (congestive) heart failure: Secondary | ICD-10-CM | POA: Diagnosis not present

## 2023-11-17 DIAGNOSIS — I10 Essential (primary) hypertension: Principal | ICD-10-CM

## 2023-11-17 DIAGNOSIS — E7800 Pure hypercholesterolemia, unspecified: Principal | ICD-10-CM

## 2023-11-17 DIAGNOSIS — Z9181 History of falling: Principal | ICD-10-CM

## 2023-11-17 MED ORDER — EMPAGLIFLOZIN 10 MG TABLET
ORAL_TABLET | Freq: Every morning | ORAL | 1 refills | 90.00000 days | Status: CP
Start: 2023-11-17 — End: ?

## 2023-11-17 MED ORDER — MEDICAL SUPPLY ITEM
0 refills | 0.00000 days | Status: CP
Start: 2023-11-17 — End: ?

## 2023-11-22 DIAGNOSIS — Z5982 Transportation insecurity: Secondary | ICD-10-CM | POA: Diagnosis not present

## 2023-11-22 DIAGNOSIS — N186 End stage renal disease: Secondary | ICD-10-CM | POA: Diagnosis not present

## 2023-11-22 DIAGNOSIS — N390 Urinary tract infection, site not specified: Secondary | ICD-10-CM | POA: Diagnosis not present

## 2023-11-22 DIAGNOSIS — Z7982 Long term (current) use of aspirin: Secondary | ICD-10-CM | POA: Diagnosis not present

## 2023-11-22 DIAGNOSIS — Z4781 Encounter for orthopedic aftercare following surgical amputation: Secondary | ICD-10-CM | POA: Diagnosis not present

## 2023-11-22 DIAGNOSIS — I5022 Chronic systolic (congestive) heart failure: Secondary | ICD-10-CM | POA: Diagnosis not present

## 2023-11-22 DIAGNOSIS — B961 Klebsiella pneumoniae [K. pneumoniae] as the cause of diseases classified elsewhere: Secondary | ICD-10-CM | POA: Diagnosis not present

## 2023-11-22 DIAGNOSIS — Z8631 Personal history of diabetic foot ulcer: Secondary | ICD-10-CM | POA: Diagnosis not present

## 2023-11-22 DIAGNOSIS — I132 Hypertensive heart and chronic kidney disease with heart failure and with stage 5 chronic kidney disease, or end stage renal disease: Secondary | ICD-10-CM | POA: Diagnosis not present

## 2023-11-22 DIAGNOSIS — I2510 Atherosclerotic heart disease of native coronary artery without angina pectoris: Secondary | ICD-10-CM | POA: Diagnosis not present

## 2023-11-22 DIAGNOSIS — K5900 Constipation, unspecified: Secondary | ICD-10-CM | POA: Diagnosis not present

## 2023-11-22 DIAGNOSIS — M6281 Muscle weakness (generalized): Secondary | ICD-10-CM | POA: Diagnosis not present

## 2023-11-22 DIAGNOSIS — E1122 Type 2 diabetes mellitus with diabetic chronic kidney disease: Secondary | ICD-10-CM | POA: Diagnosis not present

## 2023-11-22 DIAGNOSIS — Z89511 Acquired absence of right leg below knee: Secondary | ICD-10-CM | POA: Diagnosis not present

## 2023-11-22 DIAGNOSIS — E1151 Type 2 diabetes mellitus with diabetic peripheral angiopathy without gangrene: Secondary | ICD-10-CM | POA: Diagnosis not present

## 2023-11-22 DIAGNOSIS — Z992 Dependence on renal dialysis: Secondary | ICD-10-CM | POA: Diagnosis not present

## 2023-11-22 DIAGNOSIS — E113593 Type 2 diabetes mellitus with proliferative diabetic retinopathy without macular edema, bilateral: Secondary | ICD-10-CM | POA: Diagnosis not present

## 2023-11-22 DIAGNOSIS — Z89512 Acquired absence of left leg below knee: Secondary | ICD-10-CM | POA: Diagnosis not present

## 2023-11-22 DIAGNOSIS — Z7902 Long term (current) use of antithrombotics/antiplatelets: Secondary | ICD-10-CM | POA: Diagnosis not present

## 2023-11-22 DIAGNOSIS — S88112D Complete traumatic amputation at level between knee and ankle, left lower leg, subsequent encounter: Secondary | ICD-10-CM | POA: Diagnosis not present

## 2023-11-22 DIAGNOSIS — Z940 Kidney transplant status: Secondary | ICD-10-CM | POA: Diagnosis not present

## 2023-11-22 DIAGNOSIS — I509 Heart failure, unspecified: Secondary | ICD-10-CM | POA: Diagnosis not present

## 2023-11-24 DIAGNOSIS — N400 Enlarged prostate without lower urinary tract symptoms: Secondary | ICD-10-CM | POA: Diagnosis not present

## 2023-11-24 DIAGNOSIS — M6281 Muscle weakness (generalized): Secondary | ICD-10-CM | POA: Diagnosis not present

## 2023-11-24 DIAGNOSIS — Z4781 Encounter for orthopedic aftercare following surgical amputation: Secondary | ICD-10-CM | POA: Diagnosis not present

## 2023-11-24 DIAGNOSIS — Z5982 Transportation insecurity: Secondary | ICD-10-CM | POA: Diagnosis not present

## 2023-11-24 DIAGNOSIS — E113593 Type 2 diabetes mellitus with proliferative diabetic retinopathy without macular edema, bilateral: Secondary | ICD-10-CM | POA: Diagnosis not present

## 2023-11-24 DIAGNOSIS — Z8631 Personal history of diabetic foot ulcer: Secondary | ICD-10-CM | POA: Diagnosis not present

## 2023-11-24 DIAGNOSIS — N186 End stage renal disease: Secondary | ICD-10-CM | POA: Diagnosis not present

## 2023-11-24 DIAGNOSIS — I132 Hypertensive heart and chronic kidney disease with heart failure and with stage 5 chronic kidney disease, or end stage renal disease: Secondary | ICD-10-CM | POA: Diagnosis not present

## 2023-11-24 DIAGNOSIS — S88112D Complete traumatic amputation at level between knee and ankle, left lower leg, subsequent encounter: Secondary | ICD-10-CM | POA: Diagnosis not present

## 2023-11-24 DIAGNOSIS — Z89511 Acquired absence of right leg below knee: Secondary | ICD-10-CM | POA: Diagnosis not present

## 2023-11-24 DIAGNOSIS — E7800 Pure hypercholesterolemia, unspecified: Secondary | ICD-10-CM | POA: Diagnosis not present

## 2023-11-24 DIAGNOSIS — N390 Urinary tract infection, site not specified: Secondary | ICD-10-CM | POA: Diagnosis not present

## 2023-11-24 DIAGNOSIS — K5900 Constipation, unspecified: Secondary | ICD-10-CM | POA: Diagnosis not present

## 2023-11-24 DIAGNOSIS — Z7982 Long term (current) use of aspirin: Secondary | ICD-10-CM | POA: Diagnosis not present

## 2023-11-24 DIAGNOSIS — Z992 Dependence on renal dialysis: Secondary | ICD-10-CM | POA: Diagnosis not present

## 2023-11-24 DIAGNOSIS — E1151 Type 2 diabetes mellitus with diabetic peripheral angiopathy without gangrene: Secondary | ICD-10-CM | POA: Diagnosis not present

## 2023-11-24 DIAGNOSIS — I509 Heart failure, unspecified: Secondary | ICD-10-CM | POA: Diagnosis not present

## 2023-11-24 DIAGNOSIS — I10 Essential (primary) hypertension: Secondary | ICD-10-CM | POA: Diagnosis not present

## 2023-11-24 DIAGNOSIS — Z89512 Acquired absence of left leg below knee: Secondary | ICD-10-CM | POA: Diagnosis not present

## 2023-11-24 DIAGNOSIS — E1122 Type 2 diabetes mellitus with diabetic chronic kidney disease: Secondary | ICD-10-CM | POA: Diagnosis not present

## 2023-11-24 DIAGNOSIS — I2510 Atherosclerotic heart disease of native coronary artery without angina pectoris: Secondary | ICD-10-CM | POA: Diagnosis not present

## 2023-11-24 DIAGNOSIS — I5022 Chronic systolic (congestive) heart failure: Secondary | ICD-10-CM | POA: Diagnosis not present

## 2023-11-24 DIAGNOSIS — I739 Peripheral vascular disease, unspecified: Secondary | ICD-10-CM | POA: Diagnosis not present

## 2023-11-24 DIAGNOSIS — E11319 Type 2 diabetes mellitus with unspecified diabetic retinopathy without macular edema: Secondary | ICD-10-CM | POA: Diagnosis not present

## 2023-11-24 DIAGNOSIS — Z7902 Long term (current) use of antithrombotics/antiplatelets: Secondary | ICD-10-CM | POA: Diagnosis not present

## 2023-11-24 DIAGNOSIS — B961 Klebsiella pneumoniae [K. pneumoniae] as the cause of diseases classified elsewhere: Secondary | ICD-10-CM | POA: Diagnosis not present

## 2023-11-24 DIAGNOSIS — Z940 Kidney transplant status: Secondary | ICD-10-CM | POA: Diagnosis not present

## 2023-11-24 DIAGNOSIS — S88111D Complete traumatic amputation at level between knee and ankle, right lower leg, subsequent encounter: Secondary | ICD-10-CM | POA: Diagnosis not present

## 2023-11-24 DIAGNOSIS — T8611 Kidney transplant rejection: Principal | ICD-10-CM

## 2023-11-24 MED ORDER — DULOXETINE 30 MG CAPSULE,DELAYED RELEASE
ORAL_CAPSULE | Freq: Every day | ORAL | 2 refills | 180.00000 days | Status: CP
Start: 2023-11-24 — End: ?

## 2023-11-25 DIAGNOSIS — T8611 Kidney transplant rejection: Principal | ICD-10-CM

## 2023-11-28 DIAGNOSIS — Z992 Dependence on renal dialysis: Secondary | ICD-10-CM | POA: Diagnosis not present

## 2023-11-28 DIAGNOSIS — Z5181 Encounter for therapeutic drug level monitoring: Secondary | ICD-10-CM | POA: Diagnosis not present

## 2023-11-28 DIAGNOSIS — Z79621 Long term (current) use of calcineurin inhibitor: Secondary | ICD-10-CM | POA: Diagnosis not present

## 2023-11-28 DIAGNOSIS — N186 End stage renal disease: Secondary | ICD-10-CM | POA: Diagnosis not present

## 2023-12-01 DIAGNOSIS — Z8631 Personal history of diabetic foot ulcer: Secondary | ICD-10-CM | POA: Diagnosis not present

## 2023-12-01 DIAGNOSIS — M6281 Muscle weakness (generalized): Secondary | ICD-10-CM | POA: Diagnosis not present

## 2023-12-01 DIAGNOSIS — N390 Urinary tract infection, site not specified: Secondary | ICD-10-CM | POA: Diagnosis not present

## 2023-12-01 DIAGNOSIS — E1122 Type 2 diabetes mellitus with diabetic chronic kidney disease: Secondary | ICD-10-CM | POA: Diagnosis not present

## 2023-12-01 DIAGNOSIS — N186 End stage renal disease: Secondary | ICD-10-CM | POA: Diagnosis not present

## 2023-12-01 DIAGNOSIS — I5022 Chronic systolic (congestive) heart failure: Secondary | ICD-10-CM | POA: Diagnosis not present

## 2023-12-01 DIAGNOSIS — Z7902 Long term (current) use of antithrombotics/antiplatelets: Secondary | ICD-10-CM | POA: Diagnosis not present

## 2023-12-01 DIAGNOSIS — E1151 Type 2 diabetes mellitus with diabetic peripheral angiopathy without gangrene: Secondary | ICD-10-CM | POA: Diagnosis not present

## 2023-12-01 DIAGNOSIS — Z5982 Transportation insecurity: Secondary | ICD-10-CM | POA: Diagnosis not present

## 2023-12-01 DIAGNOSIS — Z89512 Acquired absence of left leg below knee: Secondary | ICD-10-CM | POA: Diagnosis not present

## 2023-12-01 DIAGNOSIS — S88112D Complete traumatic amputation at level between knee and ankle, left lower leg, subsequent encounter: Secondary | ICD-10-CM | POA: Diagnosis not present

## 2023-12-01 DIAGNOSIS — I509 Heart failure, unspecified: Secondary | ICD-10-CM | POA: Diagnosis not present

## 2023-12-01 DIAGNOSIS — Z992 Dependence on renal dialysis: Secondary | ICD-10-CM | POA: Diagnosis not present

## 2023-12-01 DIAGNOSIS — K5900 Constipation, unspecified: Secondary | ICD-10-CM | POA: Diagnosis not present

## 2023-12-01 DIAGNOSIS — I132 Hypertensive heart and chronic kidney disease with heart failure and with stage 5 chronic kidney disease, or end stage renal disease: Secondary | ICD-10-CM | POA: Diagnosis not present

## 2023-12-01 DIAGNOSIS — B961 Klebsiella pneumoniae [K. pneumoniae] as the cause of diseases classified elsewhere: Secondary | ICD-10-CM | POA: Diagnosis not present

## 2023-12-01 DIAGNOSIS — E113593 Type 2 diabetes mellitus with proliferative diabetic retinopathy without macular edema, bilateral: Secondary | ICD-10-CM | POA: Diagnosis not present

## 2023-12-01 DIAGNOSIS — Z4781 Encounter for orthopedic aftercare following surgical amputation: Secondary | ICD-10-CM | POA: Diagnosis not present

## 2023-12-01 DIAGNOSIS — Z7982 Long term (current) use of aspirin: Secondary | ICD-10-CM | POA: Diagnosis not present

## 2023-12-01 DIAGNOSIS — I2510 Atherosclerotic heart disease of native coronary artery without angina pectoris: Secondary | ICD-10-CM | POA: Diagnosis not present

## 2023-12-01 DIAGNOSIS — Z940 Kidney transplant status: Secondary | ICD-10-CM | POA: Diagnosis not present

## 2023-12-01 DIAGNOSIS — Z89511 Acquired absence of right leg below knee: Secondary | ICD-10-CM | POA: Diagnosis not present

## 2023-12-06 DIAGNOSIS — Z89511 Acquired absence of right leg below knee: Secondary | ICD-10-CM | POA: Diagnosis not present

## 2023-12-06 DIAGNOSIS — I5022 Chronic systolic (congestive) heart failure: Secondary | ICD-10-CM | POA: Diagnosis not present

## 2023-12-06 DIAGNOSIS — Z89512 Acquired absence of left leg below knee: Secondary | ICD-10-CM | POA: Diagnosis not present

## 2023-12-06 DIAGNOSIS — E1151 Type 2 diabetes mellitus with diabetic peripheral angiopathy without gangrene: Secondary | ICD-10-CM | POA: Diagnosis not present

## 2023-12-06 DIAGNOSIS — Z7902 Long term (current) use of antithrombotics/antiplatelets: Secondary | ICD-10-CM | POA: Diagnosis not present

## 2023-12-06 DIAGNOSIS — K5900 Constipation, unspecified: Secondary | ICD-10-CM | POA: Diagnosis not present

## 2023-12-06 DIAGNOSIS — E1122 Type 2 diabetes mellitus with diabetic chronic kidney disease: Secondary | ICD-10-CM | POA: Diagnosis not present

## 2023-12-06 DIAGNOSIS — S88112D Complete traumatic amputation at level between knee and ankle, left lower leg, subsequent encounter: Secondary | ICD-10-CM | POA: Diagnosis not present

## 2023-12-06 DIAGNOSIS — I132 Hypertensive heart and chronic kidney disease with heart failure and with stage 5 chronic kidney disease, or end stage renal disease: Secondary | ICD-10-CM | POA: Diagnosis not present

## 2023-12-06 DIAGNOSIS — E113593 Type 2 diabetes mellitus with proliferative diabetic retinopathy without macular edema, bilateral: Secondary | ICD-10-CM | POA: Diagnosis not present

## 2023-12-06 DIAGNOSIS — I2510 Atherosclerotic heart disease of native coronary artery without angina pectoris: Secondary | ICD-10-CM | POA: Diagnosis not present

## 2023-12-06 DIAGNOSIS — I509 Heart failure, unspecified: Secondary | ICD-10-CM | POA: Diagnosis not present

## 2023-12-06 DIAGNOSIS — Z940 Kidney transplant status: Secondary | ICD-10-CM | POA: Diagnosis not present

## 2023-12-06 DIAGNOSIS — N390 Urinary tract infection, site not specified: Secondary | ICD-10-CM | POA: Diagnosis not present

## 2023-12-06 DIAGNOSIS — N186 End stage renal disease: Secondary | ICD-10-CM | POA: Diagnosis not present

## 2023-12-06 DIAGNOSIS — Z8631 Personal history of diabetic foot ulcer: Secondary | ICD-10-CM | POA: Diagnosis not present

## 2023-12-06 DIAGNOSIS — B961 Klebsiella pneumoniae [K. pneumoniae] as the cause of diseases classified elsewhere: Secondary | ICD-10-CM | POA: Diagnosis not present

## 2023-12-06 DIAGNOSIS — Z5982 Transportation insecurity: Secondary | ICD-10-CM | POA: Diagnosis not present

## 2023-12-06 DIAGNOSIS — Z4781 Encounter for orthopedic aftercare following surgical amputation: Secondary | ICD-10-CM | POA: Diagnosis not present

## 2023-12-06 DIAGNOSIS — Z992 Dependence on renal dialysis: Secondary | ICD-10-CM | POA: Diagnosis not present

## 2023-12-06 DIAGNOSIS — M6281 Muscle weakness (generalized): Secondary | ICD-10-CM | POA: Diagnosis not present

## 2023-12-06 DIAGNOSIS — Z7982 Long term (current) use of aspirin: Secondary | ICD-10-CM | POA: Diagnosis not present

## 2023-12-06 DIAGNOSIS — T8611 Kidney transplant rejection: Principal | ICD-10-CM

## 2023-12-06 DIAGNOSIS — Z48298 Encounter for aftercare following other organ transplant: Principal | ICD-10-CM

## 2023-12-08 DIAGNOSIS — S88112D Complete traumatic amputation at level between knee and ankle, left lower leg, subsequent encounter: Secondary | ICD-10-CM | POA: Diagnosis not present

## 2023-12-08 DIAGNOSIS — E113593 Type 2 diabetes mellitus with proliferative diabetic retinopathy without macular edema, bilateral: Secondary | ICD-10-CM | POA: Diagnosis not present

## 2023-12-08 DIAGNOSIS — N186 End stage renal disease: Secondary | ICD-10-CM | POA: Diagnosis not present

## 2023-12-08 DIAGNOSIS — Z5982 Transportation insecurity: Secondary | ICD-10-CM | POA: Diagnosis not present

## 2023-12-08 DIAGNOSIS — E1151 Type 2 diabetes mellitus with diabetic peripheral angiopathy without gangrene: Secondary | ICD-10-CM | POA: Diagnosis not present

## 2023-12-08 DIAGNOSIS — N390 Urinary tract infection, site not specified: Secondary | ICD-10-CM | POA: Diagnosis not present

## 2023-12-08 DIAGNOSIS — I132 Hypertensive heart and chronic kidney disease with heart failure and with stage 5 chronic kidney disease, or end stage renal disease: Secondary | ICD-10-CM | POA: Diagnosis not present

## 2023-12-08 DIAGNOSIS — Z992 Dependence on renal dialysis: Secondary | ICD-10-CM | POA: Diagnosis not present

## 2023-12-08 DIAGNOSIS — I5022 Chronic systolic (congestive) heart failure: Secondary | ICD-10-CM | POA: Diagnosis not present

## 2023-12-08 DIAGNOSIS — Z7982 Long term (current) use of aspirin: Secondary | ICD-10-CM | POA: Diagnosis not present

## 2023-12-08 DIAGNOSIS — K5900 Constipation, unspecified: Secondary | ICD-10-CM | POA: Diagnosis not present

## 2023-12-08 DIAGNOSIS — M6281 Muscle weakness (generalized): Secondary | ICD-10-CM | POA: Diagnosis not present

## 2023-12-08 DIAGNOSIS — Z8631 Personal history of diabetic foot ulcer: Secondary | ICD-10-CM | POA: Diagnosis not present

## 2023-12-08 DIAGNOSIS — Z89511 Acquired absence of right leg below knee: Secondary | ICD-10-CM | POA: Diagnosis not present

## 2023-12-08 DIAGNOSIS — Z7902 Long term (current) use of antithrombotics/antiplatelets: Secondary | ICD-10-CM | POA: Diagnosis not present

## 2023-12-08 DIAGNOSIS — I2510 Atherosclerotic heart disease of native coronary artery without angina pectoris: Secondary | ICD-10-CM | POA: Diagnosis not present

## 2023-12-08 DIAGNOSIS — Z940 Kidney transplant status: Secondary | ICD-10-CM | POA: Diagnosis not present

## 2023-12-08 DIAGNOSIS — I509 Heart failure, unspecified: Secondary | ICD-10-CM | POA: Diagnosis not present

## 2023-12-08 DIAGNOSIS — B961 Klebsiella pneumoniae [K. pneumoniae] as the cause of diseases classified elsewhere: Secondary | ICD-10-CM | POA: Diagnosis not present

## 2023-12-08 DIAGNOSIS — E1122 Type 2 diabetes mellitus with diabetic chronic kidney disease: Secondary | ICD-10-CM | POA: Diagnosis not present

## 2023-12-08 DIAGNOSIS — Z4781 Encounter for orthopedic aftercare following surgical amputation: Secondary | ICD-10-CM | POA: Diagnosis not present

## 2023-12-08 DIAGNOSIS — Z89512 Acquired absence of left leg below knee: Secondary | ICD-10-CM | POA: Diagnosis not present

## 2023-12-13 DIAGNOSIS — Z7982 Long term (current) use of aspirin: Secondary | ICD-10-CM | POA: Diagnosis not present

## 2023-12-13 DIAGNOSIS — Z8631 Personal history of diabetic foot ulcer: Secondary | ICD-10-CM | POA: Diagnosis not present

## 2023-12-13 DIAGNOSIS — B961 Klebsiella pneumoniae [K. pneumoniae] as the cause of diseases classified elsewhere: Secondary | ICD-10-CM | POA: Diagnosis not present

## 2023-12-13 DIAGNOSIS — E113593 Type 2 diabetes mellitus with proliferative diabetic retinopathy without macular edema, bilateral: Secondary | ICD-10-CM | POA: Diagnosis not present

## 2023-12-13 DIAGNOSIS — M6281 Muscle weakness (generalized): Secondary | ICD-10-CM | POA: Diagnosis not present

## 2023-12-13 DIAGNOSIS — I5022 Chronic systolic (congestive) heart failure: Secondary | ICD-10-CM | POA: Diagnosis not present

## 2023-12-13 DIAGNOSIS — I132 Hypertensive heart and chronic kidney disease with heart failure and with stage 5 chronic kidney disease, or end stage renal disease: Secondary | ICD-10-CM | POA: Diagnosis not present

## 2023-12-13 DIAGNOSIS — Z5982 Transportation insecurity: Secondary | ICD-10-CM | POA: Diagnosis not present

## 2023-12-13 DIAGNOSIS — E1122 Type 2 diabetes mellitus with diabetic chronic kidney disease: Secondary | ICD-10-CM | POA: Diagnosis not present

## 2023-12-13 DIAGNOSIS — Z89512 Acquired absence of left leg below knee: Secondary | ICD-10-CM | POA: Diagnosis not present

## 2023-12-13 DIAGNOSIS — E1151 Type 2 diabetes mellitus with diabetic peripheral angiopathy without gangrene: Secondary | ICD-10-CM | POA: Diagnosis not present

## 2023-12-13 DIAGNOSIS — Z992 Dependence on renal dialysis: Secondary | ICD-10-CM | POA: Diagnosis not present

## 2023-12-13 DIAGNOSIS — Z940 Kidney transplant status: Secondary | ICD-10-CM | POA: Diagnosis not present

## 2023-12-13 DIAGNOSIS — Z4781 Encounter for orthopedic aftercare following surgical amputation: Secondary | ICD-10-CM | POA: Diagnosis not present

## 2023-12-13 DIAGNOSIS — Z7902 Long term (current) use of antithrombotics/antiplatelets: Secondary | ICD-10-CM | POA: Diagnosis not present

## 2023-12-13 DIAGNOSIS — S88112D Complete traumatic amputation at level between knee and ankle, left lower leg, subsequent encounter: Secondary | ICD-10-CM | POA: Diagnosis not present

## 2023-12-13 DIAGNOSIS — N390 Urinary tract infection, site not specified: Secondary | ICD-10-CM | POA: Diagnosis not present

## 2023-12-13 DIAGNOSIS — I509 Heart failure, unspecified: Secondary | ICD-10-CM | POA: Diagnosis not present

## 2023-12-13 DIAGNOSIS — I2510 Atherosclerotic heart disease of native coronary artery without angina pectoris: Secondary | ICD-10-CM | POA: Diagnosis not present

## 2023-12-13 DIAGNOSIS — N186 End stage renal disease: Secondary | ICD-10-CM | POA: Diagnosis not present

## 2023-12-13 DIAGNOSIS — K5900 Constipation, unspecified: Secondary | ICD-10-CM | POA: Diagnosis not present

## 2023-12-13 DIAGNOSIS — Z89511 Acquired absence of right leg below knee: Secondary | ICD-10-CM | POA: Diagnosis not present

## 2023-12-15 DIAGNOSIS — I2510 Atherosclerotic heart disease of native coronary artery without angina pectoris: Secondary | ICD-10-CM | POA: Diagnosis not present

## 2023-12-15 DIAGNOSIS — Z8631 Personal history of diabetic foot ulcer: Secondary | ICD-10-CM | POA: Diagnosis not present

## 2023-12-15 DIAGNOSIS — Z89512 Acquired absence of left leg below knee: Secondary | ICD-10-CM | POA: Diagnosis not present

## 2023-12-15 DIAGNOSIS — I509 Heart failure, unspecified: Secondary | ICD-10-CM | POA: Diagnosis not present

## 2023-12-15 DIAGNOSIS — K5900 Constipation, unspecified: Secondary | ICD-10-CM | POA: Diagnosis not present

## 2023-12-15 DIAGNOSIS — Z940 Kidney transplant status: Secondary | ICD-10-CM | POA: Diagnosis not present

## 2023-12-15 DIAGNOSIS — M6281 Muscle weakness (generalized): Secondary | ICD-10-CM | POA: Diagnosis not present

## 2023-12-15 DIAGNOSIS — Z7902 Long term (current) use of antithrombotics/antiplatelets: Secondary | ICD-10-CM | POA: Diagnosis not present

## 2023-12-15 DIAGNOSIS — B961 Klebsiella pneumoniae [K. pneumoniae] as the cause of diseases classified elsewhere: Secondary | ICD-10-CM | POA: Diagnosis not present

## 2023-12-15 DIAGNOSIS — I5022 Chronic systolic (congestive) heart failure: Secondary | ICD-10-CM | POA: Diagnosis not present

## 2023-12-15 DIAGNOSIS — Z7982 Long term (current) use of aspirin: Secondary | ICD-10-CM | POA: Diagnosis not present

## 2023-12-15 DIAGNOSIS — E1151 Type 2 diabetes mellitus with diabetic peripheral angiopathy without gangrene: Secondary | ICD-10-CM | POA: Diagnosis not present

## 2023-12-15 DIAGNOSIS — Z89511 Acquired absence of right leg below knee: Secondary | ICD-10-CM | POA: Diagnosis not present

## 2023-12-15 DIAGNOSIS — E1122 Type 2 diabetes mellitus with diabetic chronic kidney disease: Secondary | ICD-10-CM | POA: Diagnosis not present

## 2023-12-15 DIAGNOSIS — Z992 Dependence on renal dialysis: Secondary | ICD-10-CM | POA: Diagnosis not present

## 2023-12-15 DIAGNOSIS — E113593 Type 2 diabetes mellitus with proliferative diabetic retinopathy without macular edema, bilateral: Secondary | ICD-10-CM | POA: Diagnosis not present

## 2023-12-15 DIAGNOSIS — Z5982 Transportation insecurity: Secondary | ICD-10-CM | POA: Diagnosis not present

## 2023-12-15 DIAGNOSIS — N186 End stage renal disease: Secondary | ICD-10-CM | POA: Diagnosis not present

## 2023-12-15 DIAGNOSIS — Z4781 Encounter for orthopedic aftercare following surgical amputation: Secondary | ICD-10-CM | POA: Diagnosis not present

## 2023-12-15 DIAGNOSIS — S88112D Complete traumatic amputation at level between knee and ankle, left lower leg, subsequent encounter: Secondary | ICD-10-CM | POA: Diagnosis not present

## 2023-12-15 DIAGNOSIS — N390 Urinary tract infection, site not specified: Secondary | ICD-10-CM | POA: Diagnosis not present

## 2023-12-15 DIAGNOSIS — I132 Hypertensive heart and chronic kidney disease with heart failure and with stage 5 chronic kidney disease, or end stage renal disease: Secondary | ICD-10-CM | POA: Diagnosis not present

## 2023-12-22 DIAGNOSIS — K5900 Constipation, unspecified: Secondary | ICD-10-CM | POA: Diagnosis not present

## 2023-12-22 DIAGNOSIS — Z940 Kidney transplant status: Secondary | ICD-10-CM | POA: Diagnosis not present

## 2023-12-22 DIAGNOSIS — Z992 Dependence on renal dialysis: Secondary | ICD-10-CM | POA: Diagnosis not present

## 2023-12-22 DIAGNOSIS — M6281 Muscle weakness (generalized): Secondary | ICD-10-CM | POA: Diagnosis not present

## 2023-12-22 DIAGNOSIS — I132 Hypertensive heart and chronic kidney disease with heart failure and with stage 5 chronic kidney disease, or end stage renal disease: Secondary | ICD-10-CM | POA: Diagnosis not present

## 2023-12-22 DIAGNOSIS — N186 End stage renal disease: Secondary | ICD-10-CM | POA: Diagnosis not present

## 2023-12-22 DIAGNOSIS — Z8631 Personal history of diabetic foot ulcer: Secondary | ICD-10-CM | POA: Diagnosis not present

## 2023-12-22 DIAGNOSIS — E1151 Type 2 diabetes mellitus with diabetic peripheral angiopathy without gangrene: Secondary | ICD-10-CM | POA: Diagnosis not present

## 2023-12-22 DIAGNOSIS — B961 Klebsiella pneumoniae [K. pneumoniae] as the cause of diseases classified elsewhere: Secondary | ICD-10-CM | POA: Diagnosis not present

## 2023-12-22 DIAGNOSIS — Z89512 Acquired absence of left leg below knee: Secondary | ICD-10-CM | POA: Diagnosis not present

## 2023-12-22 DIAGNOSIS — Z5982 Transportation insecurity: Secondary | ICD-10-CM | POA: Diagnosis not present

## 2023-12-22 DIAGNOSIS — Z89511 Acquired absence of right leg below knee: Secondary | ICD-10-CM | POA: Diagnosis not present

## 2023-12-22 DIAGNOSIS — I2510 Atherosclerotic heart disease of native coronary artery without angina pectoris: Secondary | ICD-10-CM | POA: Diagnosis not present

## 2023-12-22 DIAGNOSIS — E113593 Type 2 diabetes mellitus with proliferative diabetic retinopathy without macular edema, bilateral: Secondary | ICD-10-CM | POA: Diagnosis not present

## 2023-12-22 DIAGNOSIS — Z4781 Encounter for orthopedic aftercare following surgical amputation: Secondary | ICD-10-CM | POA: Diagnosis not present

## 2023-12-22 DIAGNOSIS — Z7982 Long term (current) use of aspirin: Secondary | ICD-10-CM | POA: Diagnosis not present

## 2023-12-22 DIAGNOSIS — Z7902 Long term (current) use of antithrombotics/antiplatelets: Secondary | ICD-10-CM | POA: Diagnosis not present

## 2023-12-22 DIAGNOSIS — E1122 Type 2 diabetes mellitus with diabetic chronic kidney disease: Secondary | ICD-10-CM | POA: Diagnosis not present

## 2023-12-22 DIAGNOSIS — N390 Urinary tract infection, site not specified: Secondary | ICD-10-CM | POA: Diagnosis not present

## 2023-12-22 DIAGNOSIS — S88112D Complete traumatic amputation at level between knee and ankle, left lower leg, subsequent encounter: Secondary | ICD-10-CM | POA: Diagnosis not present

## 2023-12-22 DIAGNOSIS — I509 Heart failure, unspecified: Secondary | ICD-10-CM | POA: Diagnosis not present

## 2023-12-22 DIAGNOSIS — I5022 Chronic systolic (congestive) heart failure: Secondary | ICD-10-CM | POA: Diagnosis not present

## 2023-12-27 DIAGNOSIS — M6281 Muscle weakness (generalized): Secondary | ICD-10-CM | POA: Diagnosis not present

## 2023-12-27 DIAGNOSIS — S88112D Complete traumatic amputation at level between knee and ankle, left lower leg, subsequent encounter: Secondary | ICD-10-CM | POA: Diagnosis not present

## 2023-12-27 DIAGNOSIS — I2510 Atherosclerotic heart disease of native coronary artery without angina pectoris: Secondary | ICD-10-CM | POA: Diagnosis not present

## 2023-12-27 DIAGNOSIS — E1122 Type 2 diabetes mellitus with diabetic chronic kidney disease: Secondary | ICD-10-CM | POA: Diagnosis not present

## 2023-12-27 DIAGNOSIS — I509 Heart failure, unspecified: Secondary | ICD-10-CM | POA: Diagnosis not present

## 2023-12-27 DIAGNOSIS — N186 End stage renal disease: Secondary | ICD-10-CM | POA: Diagnosis not present

## 2023-12-27 DIAGNOSIS — Z7902 Long term (current) use of antithrombotics/antiplatelets: Secondary | ICD-10-CM | POA: Diagnosis not present

## 2023-12-27 DIAGNOSIS — Z89511 Acquired absence of right leg below knee: Secondary | ICD-10-CM | POA: Diagnosis not present

## 2023-12-27 DIAGNOSIS — Z992 Dependence on renal dialysis: Secondary | ICD-10-CM | POA: Diagnosis not present

## 2023-12-27 DIAGNOSIS — K5900 Constipation, unspecified: Secondary | ICD-10-CM | POA: Diagnosis not present

## 2023-12-27 DIAGNOSIS — I132 Hypertensive heart and chronic kidney disease with heart failure and with stage 5 chronic kidney disease, or end stage renal disease: Secondary | ICD-10-CM | POA: Diagnosis not present

## 2023-12-27 DIAGNOSIS — E113593 Type 2 diabetes mellitus with proliferative diabetic retinopathy without macular edema, bilateral: Secondary | ICD-10-CM | POA: Diagnosis not present

## 2023-12-27 DIAGNOSIS — I5022 Chronic systolic (congestive) heart failure: Secondary | ICD-10-CM | POA: Diagnosis not present

## 2023-12-27 DIAGNOSIS — Z7982 Long term (current) use of aspirin: Secondary | ICD-10-CM | POA: Diagnosis not present

## 2023-12-27 DIAGNOSIS — Z89512 Acquired absence of left leg below knee: Secondary | ICD-10-CM | POA: Diagnosis not present

## 2023-12-27 DIAGNOSIS — Z8631 Personal history of diabetic foot ulcer: Secondary | ICD-10-CM | POA: Diagnosis not present

## 2023-12-27 DIAGNOSIS — E1151 Type 2 diabetes mellitus with diabetic peripheral angiopathy without gangrene: Secondary | ICD-10-CM | POA: Diagnosis not present

## 2023-12-27 DIAGNOSIS — N390 Urinary tract infection, site not specified: Secondary | ICD-10-CM | POA: Diagnosis not present

## 2023-12-27 DIAGNOSIS — Z4781 Encounter for orthopedic aftercare following surgical amputation: Secondary | ICD-10-CM | POA: Diagnosis not present

## 2023-12-27 DIAGNOSIS — Z5982 Transportation insecurity: Secondary | ICD-10-CM | POA: Diagnosis not present

## 2023-12-27 DIAGNOSIS — Z940 Kidney transplant status: Secondary | ICD-10-CM | POA: Diagnosis not present

## 2023-12-27 DIAGNOSIS — B961 Klebsiella pneumoniae [K. pneumoniae] as the cause of diseases classified elsewhere: Secondary | ICD-10-CM | POA: Diagnosis not present

## 2024-01-08 DIAGNOSIS — T8611 Kidney transplant rejection: Principal | ICD-10-CM

## 2024-01-08 DIAGNOSIS — Z48298 Encounter for aftercare following other organ transplant: Principal | ICD-10-CM

## 2024-02-05 DIAGNOSIS — T8611 Kidney transplant rejection: Principal | ICD-10-CM

## 2024-02-05 DIAGNOSIS — Z48298 Encounter for aftercare following other organ transplant: Principal | ICD-10-CM

## 2024-03-01 ENCOUNTER — Encounter
Admit: 2024-03-01 | Discharge: 2024-03-01 | Payer: Medicare (Managed Care) | Attending: Internal Medicine | Primary: Internal Medicine

## 2024-03-01 DIAGNOSIS — I5020 Unspecified systolic (congestive) heart failure: Principal | ICD-10-CM

## 2024-03-01 DIAGNOSIS — E1122 Type 2 diabetes mellitus with diabetic chronic kidney disease: Principal | ICD-10-CM

## 2024-03-01 DIAGNOSIS — E7800 Pure hypercholesterolemia, unspecified: Principal | ICD-10-CM

## 2024-03-01 DIAGNOSIS — E11319 Type 2 diabetes mellitus with unspecified diabetic retinopathy without macular edema: Principal | ICD-10-CM

## 2024-03-01 DIAGNOSIS — S88111D Complete traumatic amputation at level between knee and ankle, right lower leg, subsequent encounter: Principal | ICD-10-CM

## 2024-03-01 DIAGNOSIS — I10 Essential (primary) hypertension: Principal | ICD-10-CM

## 2024-03-01 DIAGNOSIS — N184 Chronic kidney disease, stage 4 (severe): Principal | ICD-10-CM

## 2024-03-01 DIAGNOSIS — I739 Peripheral vascular disease, unspecified: Principal | ICD-10-CM

## 2024-03-01 DIAGNOSIS — S88112D Complete traumatic amputation at level between knee and ankle, left lower leg, subsequent encounter: Principal | ICD-10-CM

## 2024-03-04 DIAGNOSIS — T8611 Kidney transplant rejection: Principal | ICD-10-CM

## 2024-03-04 DIAGNOSIS — Z48298 Encounter for aftercare following other organ transplant: Principal | ICD-10-CM

## 2024-03-08 ENCOUNTER — Ambulatory Visit: Admit: 2024-03-08 | Discharge: 2024-03-09 | Payer: Medicare (Managed Care)

## 2024-03-08 DIAGNOSIS — R269 Unspecified abnormalities of gait and mobility: Principal | ICD-10-CM

## 2024-03-08 DIAGNOSIS — S88112D Complete traumatic amputation at level between knee and ankle, left lower leg, subsequent encounter: Principal | ICD-10-CM

## 2024-03-08 DIAGNOSIS — S88111D Complete traumatic amputation at level between knee and ankle, right lower leg, subsequent encounter: Principal | ICD-10-CM

## 2024-03-09 DIAGNOSIS — N186 End stage renal disease: Principal | ICD-10-CM

## 2024-03-22 DIAGNOSIS — T8611 Kidney transplant rejection: Principal | ICD-10-CM

## 2024-03-22 MED ORDER — EZETIMIBE 10 MG TABLET
ORAL_TABLET | Freq: Every evening | ORAL | 3 refills | 90.00000 days | Status: CP
Start: 2024-03-22 — End: ?

## 2024-03-22 MED ORDER — PANTOPRAZOLE 40 MG TABLET,DELAYED RELEASE
ORAL_TABLET | Freq: Every day | ORAL | 3 refills | 90.00000 days | Status: CP
Start: 2024-03-22 — End: ?

## 2024-04-02 DIAGNOSIS — R29898 Other symptoms and signs involving the musculoskeletal system: Principal | ICD-10-CM

## 2024-04-12 ENCOUNTER — Ambulatory Visit: Admit: 2024-04-12 | Discharge: 2024-04-13 | Payer: Medicare (Managed Care)

## 2024-04-12 DIAGNOSIS — I10 Essential (primary) hypertension: Principal | ICD-10-CM

## 2024-04-12 DIAGNOSIS — I2510 Atherosclerotic heart disease of native coronary artery without angina pectoris: Principal | ICD-10-CM

## 2024-04-12 DIAGNOSIS — I739 Peripheral vascular disease, unspecified: Principal | ICD-10-CM

## 2024-04-12 DIAGNOSIS — I5020 Unspecified systolic (congestive) heart failure: Principal | ICD-10-CM

## 2024-04-19 DIAGNOSIS — T8611 Kidney transplant rejection: Principal | ICD-10-CM

## 2024-04-19 MED ORDER — CLOPIDOGREL 75 MG TABLET
ORAL_TABLET | Freq: Every day | ORAL | 3 refills | 90.00000 days | Status: CP
Start: 2024-04-19 — End: ?

## 2024-05-14 IMAGING — CT CT NECK W/O CM
3 of 4 series · 12 of 28 positions shown, 14 images · non-contrast
Comparison: None Available.

CLINICAL DATA: Epiglottitis or tonsillitis suspected. Hemoptysis
for 1 week. Leukocytosis and positive throat swab for group A strep.

EXAM:
CT NECK WITHOUT CONTRAST
TECHNIQUE: Multidetector CT imaging of the neck was performed following the
standard protocol without intravenous contrast.
RADIATION DOSE REDUCTION: This exam was performed according to the
departmental dose-optimization program which includes automated
exposure control, adjustment of the mA and/or kV according to
patient size and/or use of iterative reconstruction technique.

[Series 2: axial neck · axial · 0.80mm/px · z∈[-285,-147]mm · 4 of 117 slices shown, 5 images]
[im 24/117  soft-tissue]
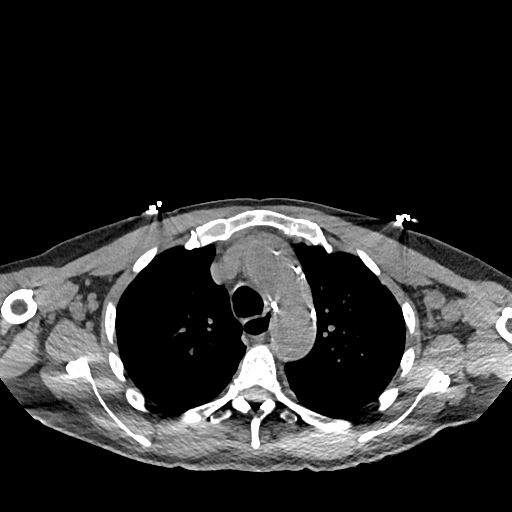
[im 24/117  bone]
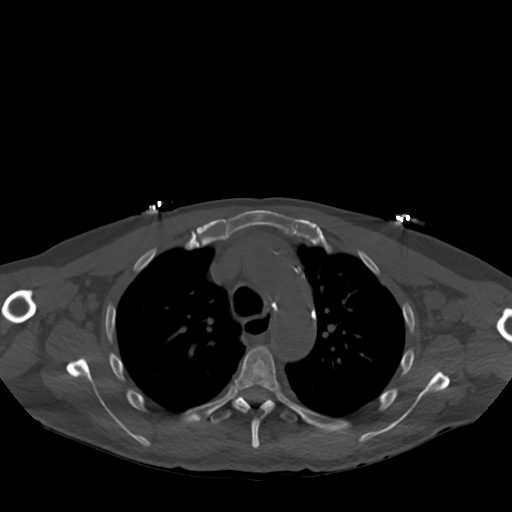
[im 47/117  bone]
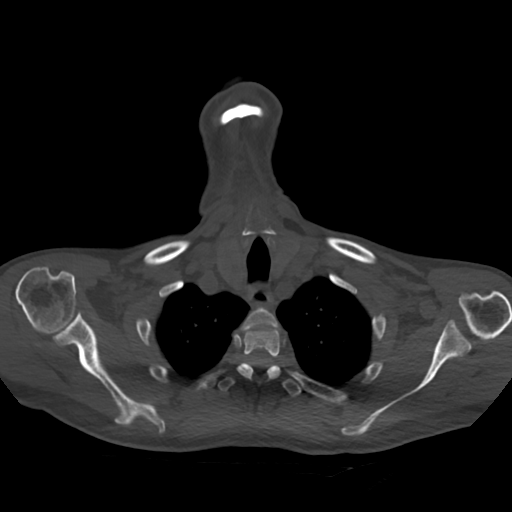
[im 70/117  bone]
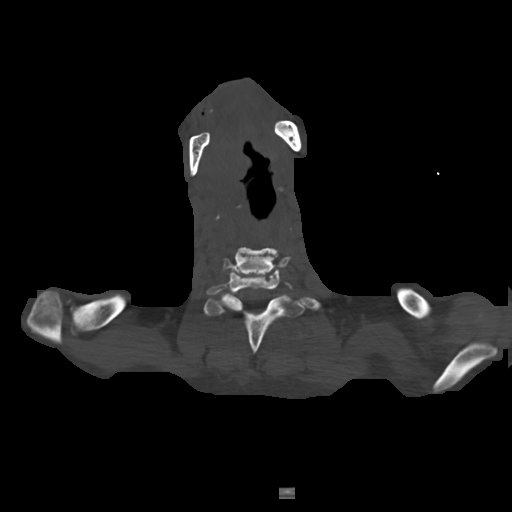
[im 93/117  bone]
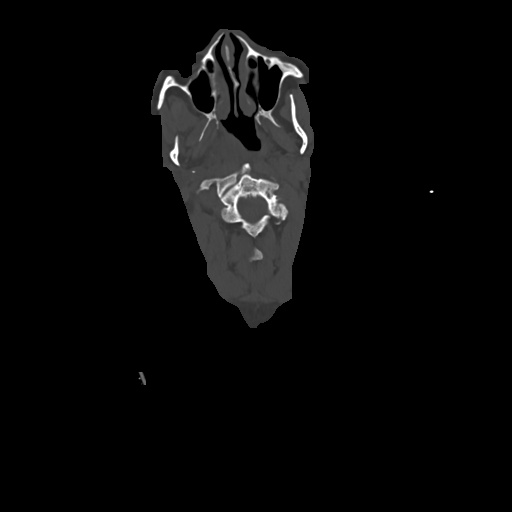

[Series 4: axial bone · axial · 0.80mm/px · z∈[-272,-156]mm · 3 of 117 slices shown]
[im 30/117  bone]
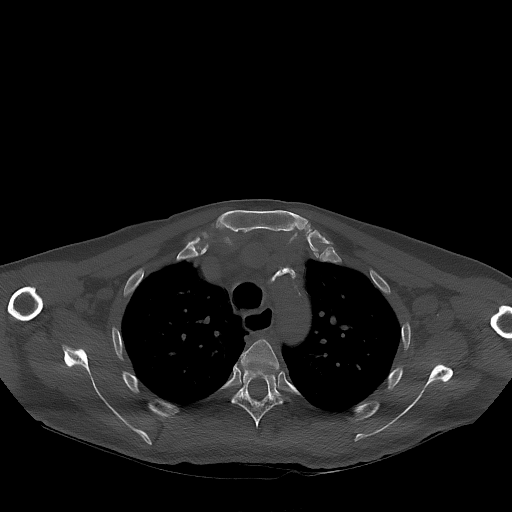
[im 59/117  bone]
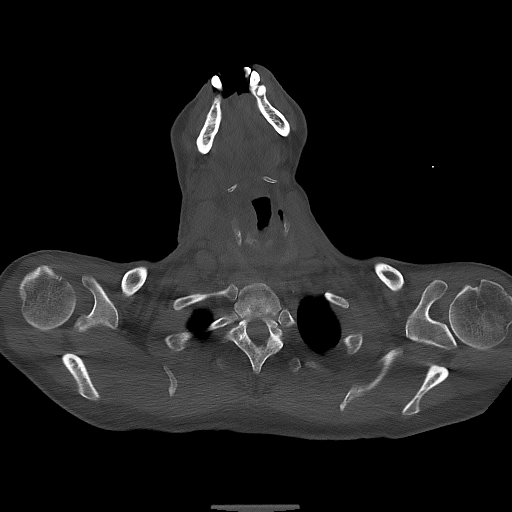
[im 88/117  bone]
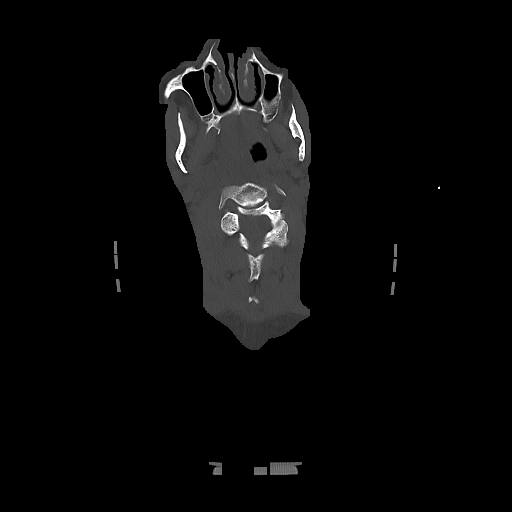

[Series 5: sag neck · sagittal · 0.71mm/px · 5 of 87 slices shown, 6 images]
[im 29/87  bone]
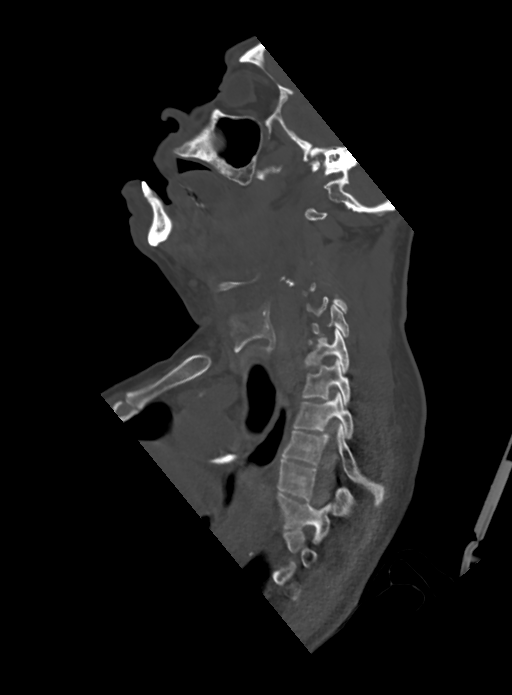
[im 36/87  bone]
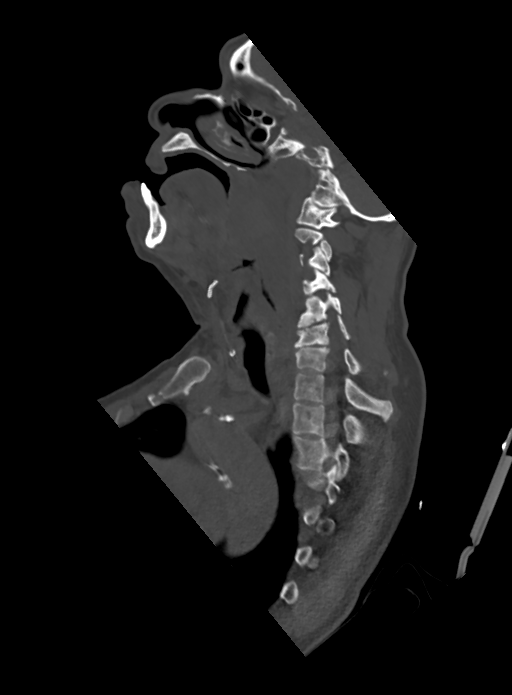
[im 44/87  soft-tissue]
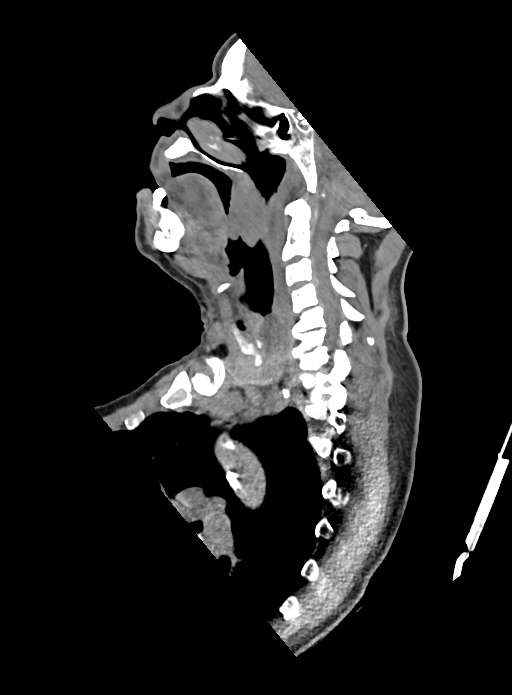
[im 44/87  bone]
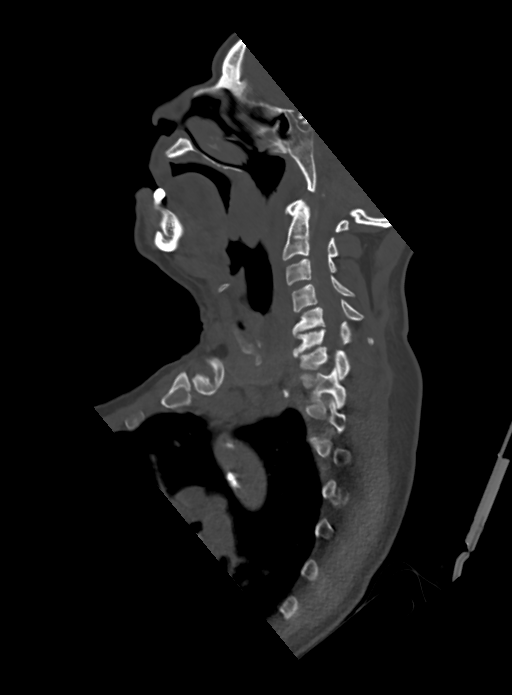
[im 51/87  bone]
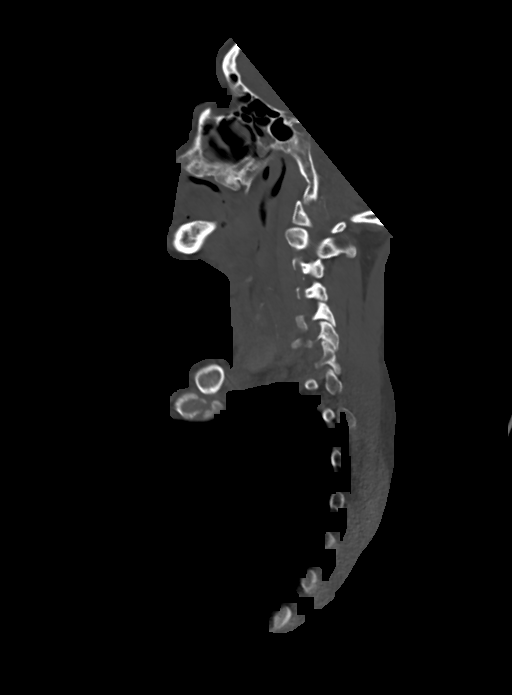
[im 58/87  bone]
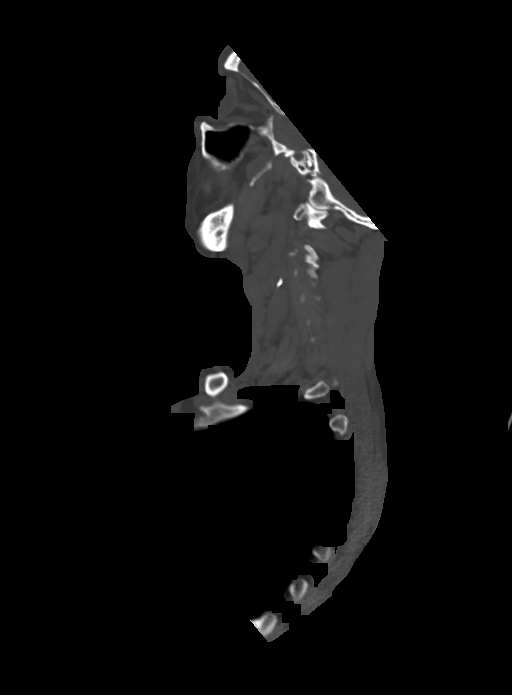

[12 of 28 positions shown; findings below may reference images not displayed]

FINDINGS: Pharynx and larynx: There is masslike, predominantly low-density
enlargement of the right-sided oropharyngeal soft tissues centered
in the palatine tonsil measuring approximately 4 x 4 cm on axial
images and approximately 6 cm craniocaudal. Less pronounced
low-density soft tissue thickening or edema extends superiorly into
the nasopharynx on the right as well as inferiorly in the oropharynx
and hypopharynx. The soft palate is also involved, and there is
milder soft tissue thickening or edema involving the posterior and
left lateral walls of the oropharynx. There is mild narrowing of the
upper oropharyngeal airway. Edema extends into the right lateral
tongue base region, and there is asymmetric effacement of the
vallecula on the right. No retropharyngeal fluid collection.

Salivary glands: The above described inflammation extends into the
right submandibular space. The submandibular glands are symmetric in
size. The parotid glands are unremarkable.

Thyroid: Unremarkable.

Lymph nodes: Borderline enlarged right level II and III lymph nodes
measuring up to 1 cm in short axis.

Vascular: Moderate calcific atherosclerosis at the right greater
than left carotid bifurcations.

Limited intracranial: Unremarkable.

Visualized orbits: Left cataract extraction.

Mastoids and visualized paranasal sinuses: Clear.

Skeleton: Absent maxillary dentition. Advanced disc degeneration at
C5-6 and C6-7. No suspicious osseous lesion.

Upper chest: Small clustered nodular opacities in the left upper
lobe. Aortic atherosclerosis. No superior mediastinal
lymphadenopathy.

Other: None.
IMPRESSION: 1. Diffusely abnormal appearance of the pharynx including marked
enlargement of the right tonsillar region with surrounding edema as
detailed above. Complete characterization is limited on this
noncontrast study. Acute tonsillitis is favored with this clinical
history, however direct visualization and close clinical follow-up
are recommended as neoplasm is possible as well. Limited assessment
for peritonsillar abscess in the absence of IV contrast.
2. Borderline enlarged right level II and III lymph nodes, favored
to be reactive.
3. Small clustered left upper lobe pulmonary opacities, likely
infectious or inflammatory.
4. Aortic Atherosclerosis ([id number]).

## 2024-05-22 ENCOUNTER — Other Ambulatory Visit: Payer: Self-pay

## 2024-05-22 ENCOUNTER — Ambulatory Visit: Attending: Nephrology | Admitting: Physical Therapy

## 2024-05-22 ENCOUNTER — Encounter: Payer: Self-pay | Admitting: Physical Therapy

## 2024-05-22 DIAGNOSIS — R262 Difficulty in walking, not elsewhere classified: Secondary | ICD-10-CM | POA: Insufficient documentation

## 2024-05-22 DIAGNOSIS — M6281 Muscle weakness (generalized): Secondary | ICD-10-CM | POA: Diagnosis present

## 2024-05-22 DIAGNOSIS — R269 Unspecified abnormalities of gait and mobility: Secondary | ICD-10-CM | POA: Diagnosis present

## 2024-05-22 NOTE — Therapy (Unsigned)
 " OUTPATIENT PHYSICAL THERAPY NEURO EVALUATION   Patient Name: Blake Torres MRN: [id number] DOB:[birth year], 69 y.o., male Today's Date: 05/22/2024   [facility]  REFERRING PROVIDER: Douglas Rule, MD   END OF SESSION:  PT End of Session - 05/22/24 1104     Visit Number 1    Number of Visits 24    Date for Recertification  08/14/24    PT Start Time 1105    PT Stop Time 1145    PT Time Calculation (min) 40 min    Equipment Utilized During Treatment Gait belt    Activity Tolerance Patient tolerated treatment well    Behavior During Therapy WFL for tasks assessed/performed          Past Medical History:  Diagnosis Date   Diabetes mellitus without complication (HCC)    Hypertension    Renal disorder    Past Surgical History:  Procedure Laterality Date   CHOLECYSTECTOMY     NEPHRECTOMY TRANSPLANTED ORGAN     There are no active problems to display for this patient.   ONSET DATE: 09/30/23   REFERRING DIAG:  Diagnosis  Z47.89 (ICD-10-CM) - Encounter for prosthetic gait training    THERAPY DIAG:  Abnormality of gait and mobility  Difficulty in walking, not elsewhere classified  Muscle weakness (generalized)  Rationale for Evaluation and Treatment: Rehabilitation  SUBJECTIVE:                                                                                                                                                                                             SUBJECTIVE STATEMENT:  Pt reports to PT following second below knee amputation roughly 6 months ago. Reports that he obtained new prosthetic ~ 3 months prior to PT evaluation. States that he is walking and moving around a little at home with a 4WW.   -within session, PT trials gait with with RW, and pt states that is feels much safer than rollator that he has been using at home.   Pt accompanied by: self  PERTINENT HISTORY:   From recent MD appointment. '   68 y.o. 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 Yes: NPRS scale: >5/10  Pain location: L Wrist Pain  description: sore  Aggravating factors: Weight bearing  Relieving factors: rest   PRECAUTIONS: Fall  RED FLAGS: None   WEIGHT BEARING RESTRICTIONS: WBAT   FALLS: Has patient fallen in last 6 months? No  LIVING ENVIRONMENT: Lives with: lives alone has friends to help occasionally,  has meals on wheels for nutrition  Lives in: House/apartment Stairs:  Ramped entrance Has following equipment at home: Single point cane, Environmental Consultant - 2 wheeled, Environmental Consultant - 4 wheeled, Wheelchair (power), and Wheelchair (manual)  PLOF: Independent with basic ADLs, Independent with household mobility without device, and Independent with homemaking with ambulation  PATIENT GOALS: be able to walk and balance himself. 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    OBJECTIVE:  Note: Objective measures were completed at Evaluation unless otherwise noted.  DIAGNOSTIC FINDINGS: none relevant.   COGNITION: Overall cognitive status: History of cognitive impairments - at baseline   SENSATION: Light touch: Impaired   COORDINATION: Limited     POSTURE: rounded shoulders, forward head, increased lumbar lordosis, increased thoracic kyphosis, and flexed trunk   LOWER EXTREMITY MMT:     MMT Right Eval Left Eval  Hip flexion 4 4  Hip extension    Hip abduction 5 5  Hip adduction 4+ 4+  Hip internal rotation    Hip external rotation    Knee flexion 4+ 4+  Knee extension 5 5  Ankle dorsiflexion N.a N.a   Ankle plantarflexion     Ankle inversion    Ankle eversion     (Blank rows = not tested)  LOWER EXTREMITY MMT:    MMT Right Eval Left Eval  Hip flexion    Hip extension Lacking 10 deg  Lacking 10 deg   Hip abduction    Hip adduction    Hip internal rotation    Hip external rotation    Knee flexion    Knee extension Lacking 5 deg  Lacking 5 deg   Ankle dorsiflexion    Ankle plantarflexion    Ankle inversion    Ankle eversion    (Blank rows = not tested)  BED MOBILITY:  Not tested  TRANSFERS: Sit to stand: SBA  Assistive device utilized: Environmental Consultant - 2 wheeled     Stand to sit: SBA  Assistive device utilized: Environmental Consultant - 2 wheeled     Chair to chair: SBA and CGA  Assistive device utilized: Environmental Consultant - 2 wheeled       RAMP:  Not tested  CURB:  Not tested  STAIRS: Not tested GAIT: Findings: Gait Characteristics: step through pattern, decreased step length- Right, decreased step length- Left, decreased stride length, poor foot clearance- Right, and poor foot clearance- Left, Distance walked: 33', Assistive device utilized:Walker - 2 wheeled, Level of assistance: CGA, and Comments: flexed hip and knees in stance. No LOB noted, but foot drag on only 2 steps for 10 m WT  FUNCTIONAL TESTS:  5 times sit to stand: to be completed  Timed up and go (TUG): to be completed  10 meter walk test: to be completed   PATIENT SURVEYS:  ABC scale: The Activities-Specific Balance Confidence (ABC) Scale 0% 10 20 30  40 50 60 70 80 90 100% No confidence<->completely confident  How confident are you that you will not lose your balance or become unsteady when you . . .   Date tested   Walk around the house   2. Walk up or down stairs   3. Bend over and pick up a slipper from in front  of a closet floor   4. Reach for a small can off a shelf at eye level   5. Stand on tip toes and reach for something above your head   6. Stand on a chair and reach for something   7. Sweep the floor   8. Walk outside the house to a  car parked in the driveway   9. Get into or out of a car   10. Walk across a parking lot to the mall   11. Walk up or down a ramp   12. Walk in a crowded mall where people rapidly walk past you   13. Are bumped into by people as you walk through the mall   14. Step onto or off of an escalator while you are holding onto the railing   15. Step onto or off an escalator while holding onto parcels such that you cannot hold onto the railing   16. Walk outside on icy sidewalks   Total: #/16                                                                                                                                  TREATMENT DATE:    Sit<>stand in parallel bars with supervision assist.  Gait with 180deg turns in parallel bars withCGA/supervision assist. No foot drag or knee instability noted.   Sit<>stand x 5 from WC, LUE support on the RW and RUE on arm rest due to wrist pain on the L side.   Gait with RW x 74ft as listed above. Only intermittent foot drag noted, no knee instability, but flexed hip and knee position while in standing.   HEP instructed by PT see below    PATIENT EDUCATION: Education details: POC, benefits of RW over rollator for safety at this point in recovery.  Person educated: Patient Education method: Explanation, Demonstration, Tactile cues, and Handouts Education comprehension: verbalized understanding and returned demonstration  HOME EXERCISE PROGRAM: Access Code: [email or web address] URL: [email or web address] Date: 05/22/2024 Prepared by: Massie Dollar  Exercises - Sit to Stand with Counter Support  - 1 x daily - 7 x weekly - 3 sets - 10 reps - Standing March with Counter Support  - 1 x daily - 5 x weekly - 3 sets - 10 reps - Side Stepping with Counter Support  - 1 x daily - 5 x weekly - 3 sets - 10 reps - Seated Long Arc Quad  - 1 x daily - 7 x weekly - 3 sets - 10 reps  GOALS: Goals reviewed with patient? Yes   SHORT TERM GOALS: Target date:  06/27/2024    Patient will be independent in home exercise program to improve strength/mobility for better functional independence with ADLs. Baseline:initiated on 05/22/2024 Goal status: INITIAL   LONG TERM GOALS: Target date: 08/14/2024    Patient will increase ABC scale score >80% to demonstrate better functional mobility and better confidence with ADLs.  Baseline: to be completed  Goal status: INITIAL  2.  Patient (> 4 years old) will complete five times sit to stand test in < 15 seconds indicating an increased LE strength and improved balance. Baseline: to be completed  Goal status: INITIAL  3.  Patient will increase 3 min walk test points to demonstrate decreased fall risk during functional activities Baseline: to be completed  Goal status: INITIAL  4.  Patient will increase 10 meter walk test to >1.31m/s as to improve gait speed for better community ambulation and to reduce fall risk. Baseline: to be completed  Goal status: INITIAL  5.  Patient will reduce timed up and go to <11 seconds to reduce fall risk and demonstrate improved transfer/gait ability. Baseline:  Goal status: INITIAL    ASSESSMENT:  CLINICAL IMPRESSION: Patient is a 69 y.o. male who was seen today for physical therapy evaluation and treatment for balance and gait deficits following Bil BKA. Pt has received both prosthetics. States that he has been moving and walking a little home with 4WW. Pt demonstrates generalized muscle weakness, ROM restrictions, and poor posture upon evaluation. Is able to perform short distance ambulation and sit<>stand transfers with CGA-supervision assist. Due to BLE weakness difficulty attaining full standing position, abnormality of gait pattern and poor standing balance, pt will benefit from skilled PT to improve safety with gait, increase independence, and improve overall QoL.   OBJECTIVE IMPAIRMENTS: Abnormal gait, cardiopulmonary status limiting activity, decreased activity  tolerance, decreased balance, decreased endurance, decreased knowledge of use of DME, decreased mobility, difficulty walking, decreased ROM, decreased strength, decreased safety awareness, hypomobility, impaired flexibility, impaired sensation, improper body mechanics, postural dysfunction, and prosthetic dependency .   ACTIVITY LIMITATIONS: carrying, lifting, standing, squatting, stairs, transfers, locomotion level, and caring for others  PARTICIPATION LIMITATIONS: cleaning, laundry, driving, shopping, community activity, occupation, and yard work  PERSONAL FACTORS: Age, Behavior pattern, Education, Fitness, Past/current experiences, Transportation, and 3+ comorbidities:   are also affecting patient's functional outcome.   REHAB POTENTIAL: Good  CLINICAL DECISION MAKING: Unstable/unpredictable  EVALUATION COMPLEXITY: Moderate  PLAN:  PT FREQUENCY: 1-2x/week  PT DURATION: 12 weeks  PLANNED INTERVENTIONS: 97164- PT Re-evaluation, 97750- Physical Performance Testing, 97110-Therapeutic exercises, 97530- Therapeutic activity, [id number]- Neuromuscular re-education, 97535- Self Care, [id number]- Manual therapy, [id number]- Gait training, [phone]- Orthotic/Prosthetic subsequent, [id number]- Electrical stimulation (unattended), [phone]- Electrical stimulation (manual), 20560 (1-2 muscles), 20561 (3+ muscles)- Dry Needling, Patient/Family education, Balance training, Stair training, Taping, Joint mobilization, Joint manipulation, Scar mobilization, DME instructions, Wheelchair mobility training, Cryotherapy, and Moist heat  PLAN FOR NEXT SESSION:   TUG 5x STS 3 min walk test if appropriate ABC if appropriate  Gait and general strengthening/balance.    Massie FORBES Dollar, PT 05/22/2024, 5:15 PM        "

## 2024-05-24 ENCOUNTER — Ambulatory Visit: Admitting: Physical Therapy

## 2024-05-24 DIAGNOSIS — M6281 Muscle weakness (generalized): Secondary | ICD-10-CM

## 2024-05-24 DIAGNOSIS — R269 Unspecified abnormalities of gait and mobility: Secondary | ICD-10-CM

## 2024-05-24 DIAGNOSIS — R262 Difficulty in walking, not elsewhere classified: Secondary | ICD-10-CM

## 2024-05-24 NOTE — Therapy (Signed)
 " OUTPATIENT PHYSICAL THERAPY NEURO treatment   Patient Name: Blake Torres MRN: [id number] DOB:August 30, 1955, 69 y.o., male Today's Date: 05/24/2024   [facility]  REFERRING PROVIDER: Douglas Rule, MD   END OF SESSION:  PT End of Session - 05/24/24 1103     Visit Number 2    Number of Visits 24    Date for Recertification  08/14/24    PT Start Time 1104    PT Stop Time 1145    PT Time Calculation (min) 41 min    Equipment Utilized During Treatment Gait belt    Activity Tolerance Patient tolerated treatment well    Behavior During Therapy WFL for tasks assessed/performed          Past Medical History:  Diagnosis Date   Diabetes mellitus without complication (HCC)    Hypertension    Renal disorder    Past Surgical History:  Procedure Laterality Date   CHOLECYSTECTOMY     NEPHRECTOMY TRANSPLANTED ORGAN     There are no active problems to display for this patient.   ONSET DATE: 09/30/23   REFERRING DIAG:  Diagnosis  Z47.89 (ICD-10-CM) - Encounter for prosthetic gait training    THERAPY DIAG:  Abnormality of gait and mobility  Difficulty in walking, not elsewhere classified  Muscle weakness (generalized)  Rationale for Evaluation and Treatment: Rehabilitation  SUBJECTIVE:                                                                                                                                                                                             SUBJECTIVE STATEMENT:  From Today:  Reports that he is feeling achy, but not hurting. States that he had dialysis yesterday, but is feeling better today. Ready to work because I've got things I've got to do ... I'm tired of sitting.   From Evaluation:  Pt reports to PT following second below knee amputation roughly 6 months ago. Reports that he obtained new prosthetic ~ 3 months prior to PT evaluation. States that he is walking and moving around a little at home with a 4WW.   -within session,  PT trials gait with with RW, and pt states that is feels much safer than rollator that he has been using at home.   Pt accompanied by: self  PERTINENT HISTORY:   From recent MD appointment. '   68 y.o. male with PMH of bilateral transtibial amputations (R 02/2023, L 09/30/23 due to infection), CAD s/p PCI to LAD, HTN, PAD, HFmrEF, GERD, chronic gastritis, DM2 and retinopathy, BPH, ESRD s/p kidney transplant and on HD, and HLD who presents with:  -  Bilateral transtibial amputations, pain with prostheses and abnormal gait - Rx for community [facility] prosthetist for adjustments to prosthetic height and alignment and additional socks   PAIN:  Are you having pain? Yes: NPRS scale: >5/10  Pain location: L Wrist Pain description: sore  Aggravating factors: Weight bearing  Relieving factors: rest   PRECAUTIONS: Fall  RED FLAGS: None   WEIGHT BEARING RESTRICTIONS: WBAT   FALLS: Has patient fallen in last 6 months? No  LIVING ENVIRONMENT: Lives with: lives alone has friends to help occasionally,  has meals on wheels for nutrition  Lives in: House/apartment Stairs:  Ramped entrance Has following equipment at home: Single point cane, Environmental Consultant - 2 wheeled, Environmental Consultant - 4 wheeled, Wheelchair (power), and Wheelchair (manual)  PLOF: Independent with basic ADLs, Independent with household mobility without device, and Independent with homemaking with ambulation  PATIENT GOALS: be able to walk and balance himself. 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    OBJECTIVE:  Note: Objective measures were completed at Evaluation unless otherwise noted.  DIAGNOSTIC FINDINGS: none relevant.   COGNITION: Overall cognitive status: History of cognitive impairments - at baseline   SENSATION: Light touch: Impaired   COORDINATION: Limited     POSTURE: rounded shoulders, forward head, increased lumbar lordosis, increased thoracic kyphosis, and flexed trunk   LOWER EXTREMITY MMT:     MMT Right Eval  Left Eval  Hip flexion 4 4  Hip extension    Hip abduction 5 5  Hip adduction 4+ 4+  Hip internal rotation    Hip external rotation    Knee flexion 4+ 4+  Knee extension 5 5  Ankle dorsiflexion N.a N.a   Ankle plantarflexion    Ankle inversion    Ankle eversion     (Blank rows = not tested)  LOWER EXTREMITY MMT:    MMT Right Eval Left Eval  Hip flexion    Hip extension Lacking 10 deg  Lacking 10 deg   Hip abduction    Hip adduction    Hip internal rotation    Hip external rotation    Knee flexion    Knee extension Lacking 5 deg  Lacking 5 deg   Ankle dorsiflexion    Ankle plantarflexion    Ankle inversion    Ankle eversion    (Blank rows = not tested)  BED MOBILITY:  Not tested  TRANSFERS: Sit to stand: SBA  Assistive device utilized: Environmental Consultant - 2 wheeled     Stand to sit: SBA  Assistive device utilized: Environmental Consultant - 2 wheeled     Chair to chair: SBA and CGA  Assistive device utilized: Environmental Consultant - 2 wheeled       RAMP:  Not tested  CURB:  Not tested  STAIRS: Not tested GAIT: Findings: Gait Characteristics: step through pattern, decreased step length- Right, decreased step length- Left, decreased stride length, poor foot clearance- Right, and poor foot clearance- Left, Distance walked: 33', Assistive device utilized:Walker - 2 wheeled, Level of assistance: CGA, and Comments: flexed hip and knees in stance. No LOB noted, but foot drag on only 2 steps for 10 m WT  FUNCTIONAL TESTS:  5 times sit to stand: to be completed  Timed up and go (TUG): to be completed  10 meter walk test: to be completed   PATIENT SURVEYS:  ABC scale: The Activities-Specific Balance Confidence (ABC) Scale 0% 10 20 30  40 50 60 70 80 90 100% No confidence<->completely confident  How confident are you that you will  not lose your balance or become unsteady when you . . .   Date tested   Walk around the house   2. Walk up or down stairs   3. Bend over and pick up a slipper from in front of  a closet floor   4. Reach for a small can off a shelf at eye level   5. Stand on tip toes and reach for something above your head   6. Stand on a chair and reach for something   7. Sweep the floor   8. Walk outside the house to a car parked in the driveway   9. Get into or out of a car   10. Walk across a parking lot to the mall   11. Walk up or down a ramp   12. Walk in a crowded mall where people rapidly walk past you   13. Are bumped into by people as you walk through the mall   14. Step onto or off of an escalator while you are holding onto the railing   15. Step onto or off an escalator while holding onto parcels such that you cannot hold onto the railing   16. Walk outside on icy sidewalks   Total: #/16                                                                                                                                  TREATMENT DATE:   Pt performed 5 time sit<>stand (5xSTS): 39.57 sec (>15 sec indicates increased fall risk)   PT instructed pt in TUG: 46.89 sec with RW.  1:18. with rollator with 4WW and breaks locked throughout (average of 3 trials; >13.5 sec indicates increased fall risk)  10 Meter Walk Test: Patient instructed to walk 10 meters (32.8 ft) as quickly and as safely as possible at their normal speed x2 and at a fast speed x2. Time measured from 2 meter mark to 8 meter mark to accommodate ramp-up and ramp-down.  Normal speed 1: 33.21sec Normal speed 2: 31.46 sec Average Normal speed: 0.31 m/s with RW   Cut off scores: <0.4 m/s = household Ambulator, 0.4-0.8 m/s = limited community Ambulator, >0.8 m/s = community Ambulator, >1.2 m/s = crossing a street, <1.0 = increased fall risk MCID 0.05 m/s (small), 0.13 m/s (moderate), 0.06 m/s (significant)  (ANPTA Core Set of Outcome Measures for Adults with Neurologic Conditions, 2018)  3 Min Walk Test:  Instructed patient to ambulate as quickly and as safely as possible for 6 minutes using LRAD. Patient  was allowed to take standing rest breaks without stopping the test, but if the patient required a sitting rest break the clock would be stopped and the test would be over.  Results: 167 feet using a RW with CGA-min assist. Increased time with 180deg turns.  Results indicate that the patient has reduced endurance with ambulation compared to age matched norms.  (ANPTA Core  Set of Outcome Measures for Adults with Neurologic Conditions, 2018)  HEP adjustment and education provided on the importance of ROM into hip and knee extension to improve mobility.  HEP instructed by PT see below    PATIENT EDUCATION: Education details: POC, benefits of RW over rollator for safety at this point in recovery.  Person educated: Patient Education method: Explanation, Demonstration, Tactile cues, and Handouts Education comprehension: verbalized understanding and returned demonstration  HOME EXERCISE PROGRAM: Access Code: [email or web address] URL: [email or web address] Date: 05/24/2024 Prepared by: Massie Dollar  Exercises - Sit to Stand with Counter Support  - 1 x daily - 7 x weekly - 3 sets - 10 reps - Standing March with Counter Support  - 1 x daily - 5 x weekly - 3 sets - 10 reps - Side Stepping with Counter Support  - 1 x daily - 5 x weekly - 3 sets - 10 reps - Seated Long Arc Quad  - 1 x daily - 7 x weekly - 3 sets - 10 reps - Seated Hamstring Stretch (BKA)  - 1 x daily - 7 x weekly - 3 sets - 10 reps - Prone Hip Extension with Residual Limb - Knee Bent (BKA)  - 1 x daily - 7 x weekly - 3 sets - 10 reps  GOALS: Goals reviewed with patient? Yes   SHORT TERM GOALS: Target date: 06/27/2024    Patient will be independent in home exercise program to improve strength/mobility for better functional independence with ADLs. Baseline:initiated on 05/22/2024 Goal status: INITIAL   LONG TERM GOALS: Target date: 08/14/2024    Patient will increase ABC scale score >80% to demonstrate better functional  mobility and better confidence with ADLs.  Baseline: to be completed  Goal status: INITIAL  2.  Patient (> 69 years old) will complete five times sit to stand test in < 15 seconds indicating an increased LE strength and improved balance. Baseline:39.57 sec with heavy UE support  Goal status: INITIAL  3.  Patient will increase 3 min walk test points to demonstrate decreased fall risk during functional activities Baseline:144ft with RW.  Goal status: INITIAL  4.  Patient will increase 10 meter walk test to >1.35m/s as to improve gait speed for better community ambulation and to reduce fall risk. Baseline: 0.31 m/s with RW  Goal status: INITIAL  5.  Patient will reduce timed up and go to <11 seconds to reduce fall risk and demonstrate improved transfer/gait ability. Baseline: 46.89 sec with RW. Goal status: INITIAL    ASSESSMENT:  CLINICAL IMPRESSION: Patient is a 69 y.o. male who was seen today for physical therapy treatment for balance and gait deficits following Bil BKA. Completed additional outcome measures not performed at evaluation to assess functional status. Pt demonstrates reduced mobility and increased fall risk with 5x STS >15sec, TUG>13 sec, and 3 min walk test limited to 187ft with RW. HEP advanced to include interventions to target improved knee and hip extension with education on the importance of completion of HEP. . Due to BLE weakness difficulty attaining full standing position, abnormality of gait pattern and poor standing balance, pt will benefit from skilled PT to improve safety with gait, increase independence, and improve overall QoL.   OBJECTIVE IMPAIRMENTS: Abnormal gait, cardiopulmonary status limiting activity, decreased activity tolerance, decreased balance, decreased endurance, decreased knowledge of use of DME, decreased mobility, difficulty walking, decreased ROM, decreased strength, decreased safety awareness, hypomobility, impaired flexibility, impaired  sensation, improper body mechanics, postural dysfunction, and prosthetic dependency .  ACTIVITY LIMITATIONS: carrying, lifting, standing, squatting, stairs, transfers, locomotion level, and caring for others  PARTICIPATION LIMITATIONS: cleaning, laundry, driving, shopping, community activity, occupation, and yard work  PERSONAL FACTORS: Age, Behavior pattern, Education, Fitness, Past/current experiences, Transportation, and 3+ comorbidities:   are also affecting patient's functional outcome.   REHAB POTENTIAL: Good  CLINICAL DECISION MAKING: Unstable/unpredictable  EVALUATION COMPLEXITY: Moderate  PLAN:  PT FREQUENCY: 1-2x/week  PT DURATION: 12 weeks  PLANNED INTERVENTIONS: [id number]- PT Re-evaluation, 97750- Physical Performance Testing, 97110-Therapeutic exercises, 97530- Therapeutic activity, [id number]- Neuromuscular re-education, 97535- Self Care, [id number]- Manual therapy, [id number]- Gait training, [phone]- Orthotic/Prosthetic subsequent, [id number]- Electrical stimulation (unattended), [phone]- Electrical stimulation (manual), 20560 (1-2 muscles), 20561 (3+ muscles)- Dry Needling, Patient/Family education, Balance training, Stair training, Taping, Joint mobilization, Joint manipulation, Scar mobilization, DME instructions, Wheelchair mobility training, Cryotherapy, and Moist heat  PLAN FOR NEXT SESSION:  ABC if appropriate Gait and general strengthening/balance.    Massie FORBES Dollar, PT 05/24/2024, 11:04 AM        "

## 2024-05-29 ENCOUNTER — Ambulatory Visit

## 2024-05-31 ENCOUNTER — Ambulatory Visit

## 2024-05-31 DIAGNOSIS — M6281 Muscle weakness (generalized): Secondary | ICD-10-CM

## 2024-05-31 DIAGNOSIS — R262 Difficulty in walking, not elsewhere classified: Secondary | ICD-10-CM

## 2024-05-31 DIAGNOSIS — R269 Unspecified abnormalities of gait and mobility: Secondary | ICD-10-CM

## 2024-05-31 NOTE — Therapy (Signed)
 " OUTPATIENT PHYSICAL THERAPY NEURO treatment   Patient Name: Blake Torres MRN: [id number] DOB:1955/07/12, 69 y.o., male Today's Date: 05/31/2024   [facility]  REFERRING PROVIDER: Douglas Rule, MD   END OF SESSION:  PT End of Session - 05/31/24 1309     Visit Number 3    Number of Visits 24    Date for Recertification  08/14/24    PT Start Time 1315    PT Stop Time 1400    PT Time Calculation (min) 45 min    Equipment Utilized During Treatment Gait belt    Activity Tolerance Patient tolerated treatment well    Behavior During Therapy WFL for tasks assessed/performed          Past Medical History:  Diagnosis Date   Diabetes mellitus without complication (HCC)    Hypertension    Renal disorder    Past Surgical History:  Procedure Laterality Date   CHOLECYSTECTOMY     NEPHRECTOMY TRANSPLANTED ORGAN     There are no active problems to display for this patient.   ONSET DATE: 09/30/23   REFERRING DIAG:  Diagnosis  Z47.89 (ICD-10-CM) - Encounter for prosthetic gait training    THERAPY DIAG:  Abnormality of gait and mobility  Difficulty in walking, not elsewhere classified  Muscle weakness (generalized)  Rationale for Evaluation and Treatment: Rehabilitation  SUBJECTIVE:                                                                                                                                                                                             SUBJECTIVE STATEMENT:  From Today:  Reports that he doing pretty well today. He did have dialysis yesterday.    From Evaluation:  Pt reports to PT following second below knee amputation roughly 6 months ago. Reports that he obtained new prosthetic ~ 3 months prior to PT evaluation. States that he is walking and moving around a little at home with a 4WW.   -within session, PT trials gait with with RW, and pt states that is feels much safer than rollator that he has been using at home.   Pt  accompanied by: self  PERTINENT HISTORY:   From recent MD appointment. '   68 y.o. 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 prosthetist for adjustments to prosthetic height and alignment  and additional socks   PAIN:  Are you having pain? Yes: NPRS scale: >5/10  Pain location: L Wrist Pain description: sore  Aggravating factors: Weight bearing  Relieving factors: rest   PRECAUTIONS: Fall  RED FLAGS: None   WEIGHT BEARING RESTRICTIONS: WBAT   FALLS: Has patient fallen in last 6 months? No  LIVING ENVIRONMENT: Lives with: lives alone has friends to help occasionally,  has meals on wheels for nutrition  Lives in: House/apartment Stairs:  Ramped entrance Has following equipment at home: Single point cane, Environmental Consultant - 2 wheeled, Environmental Consultant - 4 wheeled, Wheelchair (power), and Wheelchair (manual)  PLOF: Independent with basic ADLs, Independent with household mobility without device, and Independent with homemaking with ambulation  PATIENT GOALS: be able to walk and balance himself. 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    OBJECTIVE:  Note: Objective measures were completed at Evaluation unless otherwise noted.  DIAGNOSTIC FINDINGS: none relevant.   COGNITION: Overall cognitive status: History of cognitive impairments - at baseline   SENSATION: Light touch: Impaired   COORDINATION: Limited     POSTURE: rounded shoulders, forward head, increased lumbar lordosis, increased thoracic kyphosis, and flexed trunk   LOWER EXTREMITY MMT:     MMT Right Eval Left Eval  Hip flexion 4 4  Hip extension    Hip abduction 5 5  Hip adduction 4+ 4+  Hip internal rotation    Hip  external rotation    Knee flexion 4+ 4+  Knee extension 5 5  Ankle dorsiflexion N.a N.a   Ankle plantarflexion    Ankle inversion    Ankle eversion     (Blank rows = not tested)  LOWER EXTREMITY MMT:    MMT Right Eval Left Eval  Hip flexion    Hip extension Lacking 10 deg  Lacking 10 deg   Hip abduction    Hip adduction    Hip internal rotation    Hip external rotation    Knee flexion    Knee extension Lacking 5 deg  Lacking 5 deg   Ankle dorsiflexion    Ankle plantarflexion    Ankle inversion    Ankle eversion    (Blank rows = not tested)  BED MOBILITY:  Not tested  TRANSFERS: Sit to stand: SBA  Assistive device utilized: Environmental Consultant - 2 wheeled     Stand to sit: SBA  Assistive device utilized: Environmental Consultant - 2 wheeled     Chair to chair: SBA and CGA  Assistive device utilized: Environmental Consultant - 2 wheeled       RAMP:  Not tested  CURB:  Not tested  STAIRS: Not tested GAIT: Findings: Gait Characteristics: step through pattern, decreased step length- Right, decreased step length- Left, decreased stride length, poor foot clearance- Right, and poor foot clearance- Left, Distance walked: 33', Assistive device utilized:Walker - 2 wheeled, Level of assistance: CGA, and Comments: flexed hip and knees in stance. No LOB noted, but foot drag on only 2 steps for 10 m WT  FUNCTIONAL TESTS:  5 times sit to stand: to be completed  Timed up and go (TUG): to be completed  10 meter walk test: to be completed   PATIENT SURVEYS:  ABC scale: The Activities-Specific Balance Confidence (ABC) Scale 0% 10 20 30  40 50 60 70 80 90 100% No confidence<->completely confident  How confident are you that you will not lose your balance or become unsteady when you . . .   Date tested   Walk around the house  2. Walk up or down stairs   3. Bend over and pick up a slipper from in front of a closet floor   4. Reach for a small can off a shelf at eye level   5. Stand on tip toes and reach for something above  your head   6. Stand on a chair and reach for something   7. Sweep the floor   8. Walk outside the house to a car parked in the driveway   9. Get into or out of a car   10. Walk across a parking lot to the mall   11. Walk up or down a ramp   12. Walk in a crowded mall where people rapidly walk past you   13. Are bumped into by people as you walk through the mall   14. Step onto or off of an escalator while you are holding onto the railing   15. Step onto or off an escalator while holding onto parcels such that you cannot hold onto the railing   16. Walk outside on icy sidewalks   Total: #/16                                                                                                                                  TREATMENT DATE:   GT in // bars:  3 laps with bilateral UE use.  3 laps with R hand on rail, but not weight bearing.  3 laps with no UE support.   Rest Break  2 laps with no UE support.   -Standing task at white board without UE support x 4 minutes: 3 LOB.   GT with FWW and CGA: 200'   GT in // bars: x 4 laps working to improve foot clearance with larger steps and improved heel strike.   GT with FWW and CGA: 56' with cues for increased step length and improved heel strike.   ABC scale: 50.62%   HEP adjustment and education provided on the importance of ROM into hip and knee extension to improve mobility.  HEP instructed by PT see below    PATIENT EDUCATION: Education details: POC, benefits of RW over rollator for safety at this point in recovery.  Person educated: Patient Education method: Explanation, Demonstration, Tactile cues, and Handouts Education comprehension: verbalized understanding and returned demonstration  HOME EXERCISE PROGRAM: Access Code: [email or web address] URL: [email or web address] Date: 05/24/2024 Prepared by: Massie Dollar  Exercises - Sit to Stand with Counter Support  - 1 x daily - 7 x weekly - 3 sets - 10 reps - Standing  March with Counter Support  - 1 x daily - 5 x weekly - 3 sets - 10 reps - Side Stepping with Counter Support  - 1 x daily - 5 x weekly - 3 sets - 10 reps - Seated Long Arc Quad  - 1 x daily - 7 x weekly - 3 sets - 10 reps -  Seated Hamstring Stretch (BKA)  - 1 x daily - 7 x weekly - 3 sets - 10 reps - Prone Hip Extension with Residual Limb - Knee Bent (BKA)  - 1 x daily - 7 x weekly - 3 sets - 10 reps  GOALS: Goals reviewed with patient? Yes   SHORT TERM GOALS: Target date: 06/27/2024    Patient will be independent in home exercise program to improve strength/mobility for better functional independence with ADLs. Baseline:initiated on 05/22/2024 Goal status: INITIAL   LONG TERM GOALS: Target date: 08/14/2024    Patient will increase ABC scale score >80% to demonstrate better functional mobility and better confidence with ADLs.  Baseline: 50.26% Goal status: 3rd visit.   2.  Patient (> 68 years old) will complete five times sit to stand test in < 15 seconds indicating an increased LE strength and improved balance. Baseline:39.57 sec with heavy UE support  Goal status: INITIAL  3.  Patient will increase 3 min walk test points to demonstrate decreased fall risk during functional activities Baseline:120ft with RW.  Goal status: INITIAL  4.  Patient will increase 10 meter walk test to >1.3m/s as to improve gait speed for better community ambulation and to reduce fall risk. Baseline: 0.31 m/s with RW  Goal status: INITIAL  5.  Patient will reduce timed up and go to <11 seconds to reduce fall risk and demonstrate improved transfer/gait ability. Baseline: 46.89 sec with RW. Goal status: INITIAL    ASSESSMENT:  CLINICAL IMPRESSION: Patient focused on improving independence with gait and balance while standing during today's treatment session. Poor confidence noted initially and improved while walking with decreased UE touch with each trial. Pt ambulated with less and less touch  throughout treatment to eventually walking in // bars without UE support or touch. He did lose balance a few times, but regained independently. Patient was also noted to have decreased step clearance due to lack of hip/knee flexion. With practice and cuing in // bars he showed significant improved ment along with improved heel strike. He then did a GT trial at end of treatment with FWW and showed significant improvement in gait mechanics with good food clearance and heel strike.    Due to BLE weakness difficulty attaining full standing position, abnormality of gait pattern and poor standing balance, pt will benefit from skilled PT to improve safety with gait, increase independence, and improve overall QoL.   OBJECTIVE IMPAIRMENTS: Abnormal gait, cardiopulmonary status limiting activity, decreased activity tolerance, decreased balance, decreased endurance, decreased knowledge of use of DME, decreased mobility, difficulty walking, decreased ROM, decreased strength, decreased safety awareness, hypomobility, impaired flexibility, impaired sensation, improper body mechanics, postural dysfunction, and prosthetic dependency .   ACTIVITY LIMITATIONS: carrying, lifting, standing, squatting, stairs, transfers, locomotion level, and caring for others  PARTICIPATION LIMITATIONS: cleaning, laundry, driving, shopping, community activity, occupation, and yard work  PERSONAL FACTORS: Age, Behavior pattern, Education, Fitness, Past/current experiences, Transportation, and 3+ comorbidities:   are also affecting patient's functional outcome.   REHAB POTENTIAL: Good  CLINICAL DECISION MAKING: Unstable/unpredictable  EVALUATION COMPLEXITY: Moderate  PLAN:  PT FREQUENCY: 1-2x/week  PT DURATION: 12 weeks  PLANNED INTERVENTIONS: 97164- PT Re-evaluation, 97750- Physical Performance Testing, 97110-Therapeutic exercises, 97530- Therapeutic activity, [id number]- Neuromuscular re-education, 97535- Self Care, [id number]- Manual  therapy, [id number]- Gait training, [phone]- Orthotic/Prosthetic subsequent, [id number]- Electrical stimulation (unattended), [phone]- Electrical stimulation (manual), [phone] (1-2 muscles), [id number] (3+ muscles)- Dry Needling, Patient/Family education, Balance training, Stair training, Taping, Joint mobilization, Joint manipulation, Scar mobilization, DME instructions,  Wheelchair mobility training, Cryotherapy, and Moist heat  PLAN FOR NEXT SESSION:  ABC if appropriate Gait and general strengthening/balance.   Norman Sharps, PT, DPT Physical Therapist - [facility]  Norman KATHEE Sharps, [place] 05/31/2024, 2:17 PM        "

## 2024-06-05 ENCOUNTER — Ambulatory Visit

## 2024-06-05 DIAGNOSIS — M6281 Muscle weakness (generalized): Secondary | ICD-10-CM

## 2024-06-05 DIAGNOSIS — R262 Difficulty in walking, not elsewhere classified: Secondary | ICD-10-CM

## 2024-06-05 DIAGNOSIS — R269 Unspecified abnormalities of gait and mobility: Secondary | ICD-10-CM

## 2024-06-06 ENCOUNTER — Ambulatory Visit

## 2024-06-07 ENCOUNTER — Ambulatory Visit

## 2024-06-07 DIAGNOSIS — R269 Unspecified abnormalities of gait and mobility: Secondary | ICD-10-CM

## 2024-06-07 DIAGNOSIS — R262 Difficulty in walking, not elsewhere classified: Secondary | ICD-10-CM

## 2024-06-07 DIAGNOSIS — M6281 Muscle weakness (generalized): Secondary | ICD-10-CM

## 2024-06-07 NOTE — Therapy (Signed)
 " OUTPATIENT PHYSICAL THERAPY NEURO treatment   Patient Name: Blake Torres MRN: [id number] DOB:Aug 02, 1955, 69 y.o., male Today's Date: 06/07/2024   [facility]  REFERRING PROVIDER: Douglas Rule, MD   END OF SESSION:  PT End of Session - 06/07/24 1021     Visit Number 5    Number of Visits 24    Date for Recertification  08/14/24    PT Start Time 1018    Equipment Utilized During Treatment Gait belt    Activity Tolerance Patient tolerated treatment well    Behavior During Therapy [facility] for tasks assessed/performed          Past Medical History:  Diagnosis Date   Diabetes mellitus without complication (HCC)    Hypertension    Renal disorder    Past Surgical History:  Procedure Laterality Date   CHOLECYSTECTOMY     NEPHRECTOMY TRANSPLANTED ORGAN     There are no active problems to display for this patient.   ONSET DATE: 09/30/23   REFERRING DIAG:  Diagnosis  Z47.89 (ICD-10-CM) - Encounter for prosthetic gait training    THERAPY DIAG:  Abnormality of gait and mobility  Difficulty in walking, not elsewhere classified  Muscle weakness (generalized)  Rationale for Evaluation and Treatment: Rehabilitation  SUBJECTIVE:                                                                                                                                                                                             SUBJECTIVE STATEMENT:  From Today:  States he didn't look at his scheduled time and reports thankful that he could still be seen. States hasn't worked on standing or walking at home as much as he should. States the winter weather has slowed him down.    From Evaluation:  Pt reports to PT following second below knee amputation roughly 6 months ago. Reports that he obtained new prosthetic ~ 3 months prior to PT evaluation. States that he is walking and moving around a little at home with a 4WW.   -within session, PT trials gait with with RW, and pt states  that is feels much safer than rollator that he has been using at home.   Pt accompanied by: self  PERTINENT HISTORY:   From recent MD appointment. '   68 y.o. male with PMH of bilateral transtibial amputations (R 02/2023, L 09/30/23 due to infection), CAD s/p PCI to LAD, HTN, PAD, HFmrEF, GERD, chronic gastritis, DM2 and retinopathy, BPH, ESRD s/p kidney transplant and on HD, and HLD who presents with:  - Bilateral transtibial amputations, pain with prostheses and abnormal gait - Rx  for community [facility] prosthetist for adjustments to prosthetic height and alignment and additional socks   PAIN:  Are you having pain? Yes: NPRS scale: >5/10  Pain location: L Wrist Pain description: sore  Aggravating factors: Weight bearing  Relieving factors: rest   PRECAUTIONS: Fall  RED FLAGS: None   WEIGHT BEARING RESTRICTIONS: WBAT   FALLS: Has patient fallen in last 6 months? No  LIVING ENVIRONMENT: Lives with: lives alone has friends to help occasionally,  has meals on wheels for nutrition  Lives in: House/apartment Stairs:  Ramped entrance Has following equipment at home: Single point cane, Environmental Consultant - 2 wheeled, Environmental Consultant - 4 wheeled, Wheelchair (power), and Wheelchair (manual)  PLOF: Independent with basic ADLs, Independent with household mobility without device, and Independent with homemaking with ambulation  PATIENT GOALS: be able to walk and balance himself. 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    OBJECTIVE:  Note: Objective measures were completed at Evaluation unless otherwise noted.  DIAGNOSTIC FINDINGS: none relevant.   COGNITION: Overall cognitive status: History of cognitive impairments - at baseline   SENSATION: Light touch: Impaired   COORDINATION: Limited     POSTURE: rounded shoulders, forward head, increased lumbar lordosis, increased thoracic kyphosis, and flexed trunk   LOWER EXTREMITY MMT:     MMT Right Eval Left Eval  Hip flexion 4 4  Hip extension     Hip abduction 5 5  Hip adduction 4+ 4+  Hip internal rotation    Hip external rotation    Knee flexion 4+ 4+  Knee extension 5 5  Ankle dorsiflexion N.a N.a   Ankle plantarflexion    Ankle inversion    Ankle eversion     (Blank rows = not tested)  LOWER EXTREMITY MMT:    MMT Right Eval Left Eval  Hip flexion    Hip extension Lacking 10 deg  Lacking 10 deg   Hip abduction    Hip adduction    Hip internal rotation    Hip external rotation    Knee flexion    Knee extension Lacking 5 deg  Lacking 5 deg   Ankle dorsiflexion    Ankle plantarflexion    Ankle inversion    Ankle eversion    (Blank rows = not tested)  BED MOBILITY:  Not tested  TRANSFERS: Sit to stand: SBA  Assistive device utilized: Environmental Consultant - 2 wheeled     Stand to sit: SBA  Assistive device utilized: Environmental Consultant - 2 wheeled     Chair to chair: SBA and CGA  Assistive device utilized: Environmental Consultant - 2 wheeled       RAMP:  Not tested  CURB:  Not tested  STAIRS: Not tested GAIT: Findings: Gait Characteristics: step through pattern, decreased step length- Right, decreased step length- Left, decreased stride length, poor foot clearance- Right, and poor foot clearance- Left, Distance walked: 33', Assistive device utilized:Walker - 2 wheeled, Level of assistance: CGA, and Comments: flexed hip and knees in stance. No LOB noted, but foot drag on only 2 steps for 10 m WT  FUNCTIONAL TESTS:  5 times sit to stand: to be completed  Timed up and go (TUG): to be completed  10 meter walk test: to be completed   PATIENT SURVEYS:  ABC scale: The Activities-Specific Balance Confidence (ABC) Scale 0% 10 20 30  40 50 60 70 80 90 100% No confidence<->completely confident  How confident are you that you will not lose your balance or become unsteady when you . SABRA SABRA  Date tested   Walk around the house   2. Walk up or down stairs   3. Bend over and pick up a slipper from in front of a closet floor   4. Reach for a small can off  a shelf at eye level   5. Stand on tip toes and reach for something above your head   6. Stand on a chair and reach for something   7. Sweep the floor   8. Walk outside the house to a car parked in the driveway   9. Get into or out of a car   10. Walk across a parking lot to the mall   11. Walk up or down a ramp   12. Walk in a crowded mall where people rapidly walk past you   13. Are bumped into by people as you walk through the mall   14. Step onto or off of an escalator while you are holding onto the railing   15. Step onto or off an escalator while holding onto parcels such that you cannot hold onto the railing   16. Walk outside on icy sidewalks   Total: #/16                                                                                                                                  TREATMENT DATE:   Prosthetic gait training in // bars:    5 laps with R hand on rail VC to increase foot clearance with larger steps and improved heel strike.   3 laps with no UE support- Patient unsteady - walks faster - uncontrolled- quick reaching at times for UE Support.    TA:  Sit to stand x 10 reps with BUE support Step tap with BUE Support onto 6 step  Weightbearing through BLE and LUE Support in // bars- shooting bb shots x 15 reps x 2 rds.   Therex:  Instructed in standing Hip ext- 2 x 10 reps alt LE to help with gluteal strength  NMR:  -Standing in // bars without UE support x 2 minutes (some unsteadiness) - close CGA           PATIENT EDUCATION: Education details: POC, benefits of RW over rollator for safety at this point in recovery.  Person educated: Patient Education method: Explanation, Demonstration, Tactile cues, and Handouts Education comprehension: verbalized understanding and returned demonstration  HOME EXERCISE PROGRAM: Access Code: [email or web address] URL: [email or web address] Date: 05/24/2024 Prepared by: Massie Dollar  Exercises - Sit to Stand  with Counter Support  - 1 x daily - 7 x weekly - 3 sets - 10 reps - Standing March with Counter Support  - 1 x daily - 5 x weekly - 3 sets - 10 reps - Side Stepping with Counter Support  - 1 x daily - 5 x weekly - 3 sets - 10 reps - Seated Long Arc Quad  - 1 x  daily - 7 x weekly - 3 sets - 10 reps - Seated Hamstring Stretch (BKA)  - 1 x daily - 7 x weekly - 3 sets - 10 reps - Prone Hip Extension with Residual Limb - Knee Bent (BKA)  - 1 x daily - 7 x weekly - 3 sets - 10 reps  GOALS: Goals reviewed with patient? Yes   SHORT TERM GOALS: Target date: 06/27/2024    Patient will be independent in home exercise program to improve strength/mobility for better functional independence with ADLs. Baseline:initiated on 05/22/2024 Goal status: INITIAL   LONG TERM GOALS: Target date: 08/14/2024    Patient will increase ABC scale score >80% to demonstrate better functional mobility and better confidence with ADLs.  Baseline: 50.26% Goal status: 3rd visit.   2.  Patient (> 26 years old) will complete five times sit to stand test in < 15 seconds indicating an increased LE strength and improved balance. Baseline:39.57 sec with heavy UE support  Goal status: INITIAL  3.  Patient will increase 3 min walk test points to demonstrate decreased fall risk during functional activities Baseline:146ft with RW.  Goal status: INITIAL  4.  Patient will increase 10 meter walk test to >1.62m/s as to improve gait speed for better community ambulation and to reduce fall risk. Baseline: 0.31 m/s with RW  Goal status: INITIAL  5.  Patient will reduce timed up and go to <11 seconds to reduce fall risk and demonstrate improved transfer/gait ability. Baseline: 46.89 sec with RW. Goal status: INITIAL    ASSESSMENT:  CLINICAL IMPRESSION: The patient participated in prosthetic gait training within the parallel bars and demonstrated gradual improvement in step quality with cueing. With right hand support on the  rail, the patient completed five laps and required verbal cues to increase foot clearance, utilize larger steps, and achieve a more consistent heel strike. Transfer and task specific training included sit to stand repetitions with bilateral upper extremity support, step tapping to a six inch step, and weightbearing activities through both lower extremities and the left upper extremity while performing functional reaching tasks.  Overall, the patient shows improving gait mechanics with support but continues to demonstrate impaired balance, reduced prosthetic control, and decreased safety when ambulating without upper extremity assistance. Continued skilled PT is indicated to address gait stability, postural control, and lower extremity strength to improve functional mobility and safety. Provide your feedback on [employer]  OBJECTIVE IMPAIRMENTS: Abnormal gait, cardiopulmonary status limiting activity, decreased activity tolerance, decreased balance, decreased endurance, decreased knowledge of use of DME, decreased mobility, difficulty walking, decreased ROM, decreased strength, decreased safety awareness, hypomobility, impaired flexibility, impaired sensation, improper body mechanics, postural dysfunction, and prosthetic dependency .   ACTIVITY LIMITATIONS: carrying, lifting, standing, squatting, stairs, transfers, locomotion level, and caring for others  PARTICIPATION LIMITATIONS: cleaning, laundry, driving, shopping, community activity, occupation, and yard work  PERSONAL FACTORS: Age, Behavior pattern, Education, Fitness, Past/current experiences, Transportation, and 3+ comorbidities:   are also affecting patient's functional outcome.   REHAB POTENTIAL: Good  CLINICAL DECISION MAKING: Unstable/unpredictable  EVALUATION COMPLEXITY: Moderate  PLAN:  PT FREQUENCY: 1-2x/week  PT DURATION: 12 weeks  PLANNED INTERVENTIONS: 97164- PT Re-evaluation, 97750- Physical Performance Testing,  97110-Therapeutic exercises, 97530- Therapeutic activity, 97112- Neuromuscular re-education, 97535- Self Care, [id number]- Manual therapy, [phone]- Gait training, [phone]- Orthotic/Prosthetic subsequent, H9716- Electrical stimulation (unattended), [phone]- Electrical stimulation (manual), [phone] (1-2 muscles), [id number] (3+ muscles)- Dry Needling, Patient/Family education, Balance training, Stair training, Taping, Joint mobilization, Joint manipulation, Scar mobilization, DME instructions, Wheelchair  mobility training, Cryotherapy, and Moist heat  PLAN FOR NEXT SESSION:  ABC if appropriate Gait and general strengthening/balance.   Chyrl London, PT Physical Therapist - [facility]  [place], [place] 06/07/2024, 12:57 PM        "

## 2024-06-12 ENCOUNTER — Ambulatory Visit

## 2024-06-13 ENCOUNTER — Ambulatory Visit

## 2024-06-14 ENCOUNTER — Ambulatory Visit

## 2024-06-18 ENCOUNTER — Ambulatory Visit

## 2024-06-20 ENCOUNTER — Ambulatory Visit

## 2024-06-21 ENCOUNTER — Ambulatory Visit

## 2024-06-26 ENCOUNTER — Ambulatory Visit

## 2024-06-27 ENCOUNTER — Ambulatory Visit: Admitting: Physical Therapy

## 2024-06-28 ENCOUNTER — Ambulatory Visit

## 2024-07-03 ENCOUNTER — Ambulatory Visit: Admitting: Physical Therapy

## 2024-07-09 ENCOUNTER — Ambulatory Visit: Admitting: Physical Therapy

## 2024-07-11 ENCOUNTER — Ambulatory Visit: Admitting: Physical Therapy

## 2024-07-12 ENCOUNTER — Ambulatory Visit

## 2024-07-17 ENCOUNTER — Ambulatory Visit

## 2024-07-19 ENCOUNTER — Ambulatory Visit

## 2024-07-23 ENCOUNTER — Ambulatory Visit

## 2024-07-24 ENCOUNTER — Ambulatory Visit

## 2024-07-25 ENCOUNTER — Ambulatory Visit

## 2024-07-26 ENCOUNTER — Ambulatory Visit

## 2024-07-30 ENCOUNTER — Ambulatory Visit

## 2024-08-01 ENCOUNTER — Ambulatory Visit

## 2024-08-06 ENCOUNTER — Ambulatory Visit

## 2024-08-08 ENCOUNTER — Ambulatory Visit

## 2024-08-13 ENCOUNTER — Ambulatory Visit

## 2024-08-15 ENCOUNTER — Ambulatory Visit

## 2024-08-20 ENCOUNTER — Ambulatory Visit: Admitting: Physical Therapy

## 2024-08-22 ENCOUNTER — Ambulatory Visit

## 2024-08-27 ENCOUNTER — Ambulatory Visit

## 2024-08-29 ENCOUNTER — Ambulatory Visit

## 2024-09-03 ENCOUNTER — Ambulatory Visit

## 2024-09-05 ENCOUNTER — Ambulatory Visit

## 2024-09-10 ENCOUNTER — Ambulatory Visit

## 2024-09-12 ENCOUNTER — Ambulatory Visit

## 2024-09-17 ENCOUNTER — Ambulatory Visit

## 2024-09-19 ENCOUNTER — Ambulatory Visit
